# Patient Record
Sex: Female | Born: 1955 | Race: Black or African American | Hispanic: No | Marital: Single | Smoking: Never smoker
Health system: Southern US, Community
[De-identification: ages and names within clinical notes are randomized; demographics above are authoritative.]

## PROBLEM LIST (undated history)

## (undated) DIAGNOSIS — D8689 Sarcoidosis of other sites: Secondary | ICD-10-CM

## (undated) DIAGNOSIS — H469 Unspecified optic neuritis: Secondary | ICD-10-CM

## (undated) DIAGNOSIS — R269 Unspecified abnormalities of gait and mobility: Secondary | ICD-10-CM

## (undated) DIAGNOSIS — F329 Major depressive disorder, single episode, unspecified: Secondary | ICD-10-CM

## (undated) DIAGNOSIS — F32A Depression, unspecified: Secondary | ICD-10-CM

## (undated) DIAGNOSIS — E079 Disorder of thyroid, unspecified: Secondary | ICD-10-CM

## (undated) DIAGNOSIS — S3210XA Unspecified fracture of sacrum, initial encounter for closed fracture: Secondary | ICD-10-CM

## (undated) DIAGNOSIS — E039 Hypothyroidism, unspecified: Secondary | ICD-10-CM

## (undated) HISTORY — PX: TONSILLECTOMY: SUR1361

## (undated) HISTORY — DX: Depression, unspecified: F32.A

## (undated) HISTORY — DX: Major depressive disorder, single episode, unspecified: F32.9

## (undated) HISTORY — DX: Disorder of thyroid, unspecified: E07.9

---

## 2001-12-17 DIAGNOSIS — H469 Unspecified optic neuritis: Secondary | ICD-10-CM

## 2001-12-17 HISTORY — DX: Unspecified optic neuritis: H46.9

## 2011-11-16 DIAGNOSIS — L308 Other specified dermatitis: Secondary | ICD-10-CM | POA: Insufficient documentation

## 2011-11-16 DIAGNOSIS — L304 Erythema intertrigo: Secondary | ICD-10-CM | POA: Insufficient documentation

## 2013-08-21 DIAGNOSIS — H905 Unspecified sensorineural hearing loss: Secondary | ICD-10-CM | POA: Insufficient documentation

## 2013-08-21 DIAGNOSIS — H9319 Tinnitus, unspecified ear: Secondary | ICD-10-CM | POA: Insufficient documentation

## 2014-02-18 DIAGNOSIS — I959 Hypotension, unspecified: Secondary | ICD-10-CM | POA: Diagnosis not present

## 2014-02-18 DIAGNOSIS — E039 Hypothyroidism, unspecified: Secondary | ICD-10-CM | POA: Diagnosis not present

## 2014-02-18 DIAGNOSIS — R42 Dizziness and giddiness: Secondary | ICD-10-CM | POA: Diagnosis not present

## 2014-02-18 DIAGNOSIS — R609 Edema, unspecified: Secondary | ICD-10-CM | POA: Diagnosis not present

## 2014-03-22 DIAGNOSIS — R609 Edema, unspecified: Secondary | ICD-10-CM | POA: Diagnosis not present

## 2014-03-25 DIAGNOSIS — R609 Edema, unspecified: Secondary | ICD-10-CM | POA: Diagnosis not present

## 2014-03-30 DIAGNOSIS — R609 Edema, unspecified: Secondary | ICD-10-CM | POA: Diagnosis not present

## 2014-04-13 DIAGNOSIS — D869 Sarcoidosis, unspecified: Secondary | ICD-10-CM | POA: Diagnosis not present

## 2014-04-13 DIAGNOSIS — R269 Unspecified abnormalities of gait and mobility: Secondary | ICD-10-CM | POA: Diagnosis not present

## 2014-04-19 DIAGNOSIS — D869 Sarcoidosis, unspecified: Secondary | ICD-10-CM | POA: Diagnosis not present

## 2014-05-19 DIAGNOSIS — E876 Hypokalemia: Secondary | ICD-10-CM | POA: Diagnosis not present

## 2014-05-19 DIAGNOSIS — I959 Hypotension, unspecified: Secondary | ICD-10-CM | POA: Diagnosis not present

## 2014-05-19 DIAGNOSIS — F3289 Other specified depressive episodes: Secondary | ICD-10-CM | POA: Diagnosis not present

## 2014-05-19 DIAGNOSIS — F329 Major depressive disorder, single episode, unspecified: Secondary | ICD-10-CM | POA: Diagnosis not present

## 2014-05-19 DIAGNOSIS — E039 Hypothyroidism, unspecified: Secondary | ICD-10-CM | POA: Diagnosis not present

## 2014-05-19 DIAGNOSIS — R609 Edema, unspecified: Secondary | ICD-10-CM | POA: Diagnosis not present

## 2014-06-03 DIAGNOSIS — M999 Biomechanical lesion, unspecified: Secondary | ICD-10-CM | POA: Diagnosis not present

## 2014-06-03 DIAGNOSIS — M530 Cervicocranial syndrome: Secondary | ICD-10-CM | POA: Diagnosis not present

## 2014-06-03 DIAGNOSIS — M9981 Other biomechanical lesions of cervical region: Secondary | ICD-10-CM | POA: Diagnosis not present

## 2014-06-03 DIAGNOSIS — M5430 Sciatica, unspecified side: Secondary | ICD-10-CM | POA: Diagnosis not present

## 2014-06-10 DIAGNOSIS — M530 Cervicocranial syndrome: Secondary | ICD-10-CM | POA: Diagnosis not present

## 2014-06-10 DIAGNOSIS — M999 Biomechanical lesion, unspecified: Secondary | ICD-10-CM | POA: Diagnosis not present

## 2014-06-10 DIAGNOSIS — M9981 Other biomechanical lesions of cervical region: Secondary | ICD-10-CM | POA: Diagnosis not present

## 2014-06-10 DIAGNOSIS — M5430 Sciatica, unspecified side: Secondary | ICD-10-CM | POA: Diagnosis not present

## 2014-06-17 DIAGNOSIS — M530 Cervicocranial syndrome: Secondary | ICD-10-CM | POA: Diagnosis not present

## 2014-06-17 DIAGNOSIS — M9981 Other biomechanical lesions of cervical region: Secondary | ICD-10-CM | POA: Diagnosis not present

## 2014-06-17 DIAGNOSIS — M5430 Sciatica, unspecified side: Secondary | ICD-10-CM | POA: Diagnosis not present

## 2014-06-17 DIAGNOSIS — M999 Biomechanical lesion, unspecified: Secondary | ICD-10-CM | POA: Diagnosis not present

## 2014-06-24 DIAGNOSIS — M5430 Sciatica, unspecified side: Secondary | ICD-10-CM | POA: Diagnosis not present

## 2014-06-24 DIAGNOSIS — M530 Cervicocranial syndrome: Secondary | ICD-10-CM | POA: Diagnosis not present

## 2014-06-24 DIAGNOSIS — M999 Biomechanical lesion, unspecified: Secondary | ICD-10-CM | POA: Diagnosis not present

## 2014-06-24 DIAGNOSIS — M9981 Other biomechanical lesions of cervical region: Secondary | ICD-10-CM | POA: Diagnosis not present

## 2014-07-01 DIAGNOSIS — M999 Biomechanical lesion, unspecified: Secondary | ICD-10-CM | POA: Diagnosis not present

## 2014-07-01 DIAGNOSIS — M9981 Other biomechanical lesions of cervical region: Secondary | ICD-10-CM | POA: Diagnosis not present

## 2014-07-01 DIAGNOSIS — M5430 Sciatica, unspecified side: Secondary | ICD-10-CM | POA: Diagnosis not present

## 2014-07-01 DIAGNOSIS — M530 Cervicocranial syndrome: Secondary | ICD-10-CM | POA: Diagnosis not present

## 2014-07-08 DIAGNOSIS — M999 Biomechanical lesion, unspecified: Secondary | ICD-10-CM | POA: Diagnosis not present

## 2014-07-08 DIAGNOSIS — M9981 Other biomechanical lesions of cervical region: Secondary | ICD-10-CM | POA: Diagnosis not present

## 2014-07-08 DIAGNOSIS — M5430 Sciatica, unspecified side: Secondary | ICD-10-CM | POA: Diagnosis not present

## 2014-07-08 DIAGNOSIS — M530 Cervicocranial syndrome: Secondary | ICD-10-CM | POA: Diagnosis not present

## 2014-07-15 DIAGNOSIS — M999 Biomechanical lesion, unspecified: Secondary | ICD-10-CM | POA: Diagnosis not present

## 2014-07-15 DIAGNOSIS — M530 Cervicocranial syndrome: Secondary | ICD-10-CM | POA: Diagnosis not present

## 2014-07-15 DIAGNOSIS — M9981 Other biomechanical lesions of cervical region: Secondary | ICD-10-CM | POA: Diagnosis not present

## 2014-07-15 DIAGNOSIS — M5430 Sciatica, unspecified side: Secondary | ICD-10-CM | POA: Diagnosis not present

## 2014-07-27 DIAGNOSIS — F329 Major depressive disorder, single episode, unspecified: Secondary | ICD-10-CM | POA: Diagnosis not present

## 2014-07-27 DIAGNOSIS — R609 Edema, unspecified: Secondary | ICD-10-CM | POA: Diagnosis not present

## 2014-07-27 DIAGNOSIS — I959 Hypotension, unspecified: Secondary | ICD-10-CM | POA: Diagnosis not present

## 2014-07-27 DIAGNOSIS — E039 Hypothyroidism, unspecified: Secondary | ICD-10-CM | POA: Diagnosis not present

## 2014-07-27 DIAGNOSIS — F3289 Other specified depressive episodes: Secondary | ICD-10-CM | POA: Diagnosis not present

## 2014-08-02 DIAGNOSIS — H47299 Other optic atrophy, unspecified eye: Secondary | ICD-10-CM | POA: Diagnosis not present

## 2014-08-05 DIAGNOSIS — M5430 Sciatica, unspecified side: Secondary | ICD-10-CM | POA: Diagnosis not present

## 2014-08-05 DIAGNOSIS — M999 Biomechanical lesion, unspecified: Secondary | ICD-10-CM | POA: Diagnosis not present

## 2014-08-05 DIAGNOSIS — M530 Cervicocranial syndrome: Secondary | ICD-10-CM | POA: Diagnosis not present

## 2014-08-05 DIAGNOSIS — M9981 Other biomechanical lesions of cervical region: Secondary | ICD-10-CM | POA: Diagnosis not present

## 2014-08-19 DIAGNOSIS — M999 Biomechanical lesion, unspecified: Secondary | ICD-10-CM | POA: Diagnosis not present

## 2014-08-19 DIAGNOSIS — M9981 Other biomechanical lesions of cervical region: Secondary | ICD-10-CM | POA: Diagnosis not present

## 2014-08-19 DIAGNOSIS — M530 Cervicocranial syndrome: Secondary | ICD-10-CM | POA: Diagnosis not present

## 2014-08-19 DIAGNOSIS — M5430 Sciatica, unspecified side: Secondary | ICD-10-CM | POA: Diagnosis not present

## 2014-08-24 DIAGNOSIS — D869 Sarcoidosis, unspecified: Secondary | ICD-10-CM | POA: Diagnosis not present

## 2014-10-06 DIAGNOSIS — Z23 Encounter for immunization: Secondary | ICD-10-CM | POA: Diagnosis not present

## 2014-12-07 DIAGNOSIS — R51 Headache: Secondary | ICD-10-CM | POA: Diagnosis not present

## 2014-12-07 DIAGNOSIS — E876 Hypokalemia: Secondary | ICD-10-CM | POA: Diagnosis not present

## 2014-12-07 DIAGNOSIS — Z1231 Encounter for screening mammogram for malignant neoplasm of breast: Secondary | ICD-10-CM | POA: Diagnosis not present

## 2014-12-07 DIAGNOSIS — F39 Unspecified mood [affective] disorder: Secondary | ICD-10-CM | POA: Diagnosis not present

## 2014-12-07 DIAGNOSIS — E039 Hypothyroidism, unspecified: Secondary | ICD-10-CM | POA: Diagnosis not present

## 2014-12-07 DIAGNOSIS — R600 Localized edema: Secondary | ICD-10-CM | POA: Diagnosis not present

## 2014-12-24 DIAGNOSIS — Z1231 Encounter for screening mammogram for malignant neoplasm of breast: Secondary | ICD-10-CM | POA: Diagnosis not present

## 2015-02-22 DIAGNOSIS — D8689 Sarcoidosis of other sites: Secondary | ICD-10-CM | POA: Diagnosis not present

## 2015-03-17 DIAGNOSIS — E039 Hypothyroidism, unspecified: Secondary | ICD-10-CM | POA: Diagnosis not present

## 2015-03-17 DIAGNOSIS — F39 Unspecified mood [affective] disorder: Secondary | ICD-10-CM | POA: Diagnosis not present

## 2015-03-17 DIAGNOSIS — R609 Edema, unspecified: Secondary | ICD-10-CM | POA: Diagnosis not present

## 2015-03-17 DIAGNOSIS — D8689 Sarcoidosis of other sites: Secondary | ICD-10-CM | POA: Diagnosis not present

## 2015-04-11 DIAGNOSIS — D8689 Sarcoidosis of other sites: Secondary | ICD-10-CM | POA: Diagnosis not present

## 2015-04-11 DIAGNOSIS — R2681 Unsteadiness on feet: Secondary | ICD-10-CM | POA: Diagnosis not present

## 2015-05-09 DIAGNOSIS — E039 Hypothyroidism, unspecified: Secondary | ICD-10-CM | POA: Diagnosis not present

## 2015-05-09 DIAGNOSIS — E860 Dehydration: Secondary | ICD-10-CM | POA: Diagnosis not present

## 2015-05-09 DIAGNOSIS — L304 Erythema intertrigo: Secondary | ICD-10-CM | POA: Diagnosis not present

## 2015-05-09 DIAGNOSIS — R358 Other polyuria: Secondary | ICD-10-CM | POA: Diagnosis not present

## 2015-05-09 DIAGNOSIS — F321 Major depressive disorder, single episode, moderate: Secondary | ICD-10-CM | POA: Diagnosis not present

## 2015-05-09 DIAGNOSIS — D8689 Sarcoidosis of other sites: Secondary | ICD-10-CM | POA: Diagnosis not present

## 2015-05-09 DIAGNOSIS — E876 Hypokalemia: Secondary | ICD-10-CM | POA: Diagnosis not present

## 2015-05-09 DIAGNOSIS — R5383 Other fatigue: Secondary | ICD-10-CM | POA: Diagnosis not present

## 2015-05-11 DIAGNOSIS — L304 Erythema intertrigo: Secondary | ICD-10-CM | POA: Diagnosis not present

## 2015-05-11 DIAGNOSIS — D8689 Sarcoidosis of other sites: Secondary | ICD-10-CM | POA: Diagnosis not present

## 2015-05-11 DIAGNOSIS — F321 Major depressive disorder, single episode, moderate: Secondary | ICD-10-CM | POA: Diagnosis not present

## 2015-05-11 DIAGNOSIS — R5383 Other fatigue: Secondary | ICD-10-CM | POA: Diagnosis not present

## 2015-05-11 DIAGNOSIS — R358 Other polyuria: Secondary | ICD-10-CM | POA: Diagnosis not present

## 2015-05-11 DIAGNOSIS — E039 Hypothyroidism, unspecified: Secondary | ICD-10-CM | POA: Diagnosis not present

## 2015-05-11 DIAGNOSIS — E876 Hypokalemia: Secondary | ICD-10-CM | POA: Diagnosis not present

## 2015-05-11 DIAGNOSIS — E860 Dehydration: Secondary | ICD-10-CM | POA: Diagnosis not present

## 2015-05-12 ENCOUNTER — Emergency Department (HOSPITAL_COMMUNITY)
Admission: EM | Admit: 2015-05-12 | Discharge: 2015-05-12 | Disposition: A | Discharge status: Home / Self Care | Payer: Medicare Other | Attending: Emergency Medicine | Admitting: Emergency Medicine

## 2015-05-12 ENCOUNTER — Emergency Department (HOSPITAL_COMMUNITY): Payer: Medicare Other

## 2015-05-12 ENCOUNTER — Encounter (HOSPITAL_COMMUNITY): Payer: Self-pay | Admitting: Emergency Medicine

## 2015-05-12 DIAGNOSIS — H540 Blindness, both eyes: Secondary | ICD-10-CM | POA: Diagnosis not present

## 2015-05-12 DIAGNOSIS — W01198A Fall on same level from slipping, tripping and stumbling with subsequent striking against other object, initial encounter: Secondary | ICD-10-CM | POA: Diagnosis not present

## 2015-05-12 DIAGNOSIS — S0990XA Unspecified injury of head, initial encounter: Secondary | ICD-10-CM | POA: Diagnosis not present

## 2015-05-12 DIAGNOSIS — Y9289 Other specified places as the place of occurrence of the external cause: Secondary | ICD-10-CM | POA: Diagnosis not present

## 2015-05-12 DIAGNOSIS — Y9389 Activity, other specified: Secondary | ICD-10-CM | POA: Insufficient documentation

## 2015-05-12 DIAGNOSIS — M545 Low back pain: Secondary | ICD-10-CM | POA: Diagnosis not present

## 2015-05-12 DIAGNOSIS — Z7952 Long term (current) use of systemic steroids: Secondary | ICD-10-CM | POA: Diagnosis not present

## 2015-05-12 DIAGNOSIS — Z79899 Other long term (current) drug therapy: Secondary | ICD-10-CM | POA: Insufficient documentation

## 2015-05-12 DIAGNOSIS — R102 Pelvic and perineal pain: Secondary | ICD-10-CM | POA: Diagnosis not present

## 2015-05-12 DIAGNOSIS — D8689 Sarcoidosis of other sites: Secondary | ICD-10-CM | POA: Diagnosis not present

## 2015-05-12 DIAGNOSIS — Z880 Allergy status to penicillin: Secondary | ICD-10-CM | POA: Insufficient documentation

## 2015-05-12 DIAGNOSIS — Z9181 History of falling: Secondary | ICD-10-CM | POA: Insufficient documentation

## 2015-05-12 DIAGNOSIS — R531 Weakness: Secondary | ICD-10-CM

## 2015-05-12 DIAGNOSIS — S3992XA Unspecified injury of lower back, initial encounter: Secondary | ICD-10-CM | POA: Diagnosis not present

## 2015-05-12 DIAGNOSIS — Z862 Personal history of diseases of the blood and blood-forming organs and certain disorders involving the immune mechanism: Secondary | ICD-10-CM | POA: Diagnosis not present

## 2015-05-12 DIAGNOSIS — R52 Pain, unspecified: Secondary | ICD-10-CM

## 2015-05-12 DIAGNOSIS — S3993XA Unspecified injury of pelvis, initial encounter: Secondary | ICD-10-CM | POA: Diagnosis not present

## 2015-05-12 DIAGNOSIS — Y998 Other external cause status: Secondary | ICD-10-CM | POA: Insufficient documentation

## 2015-05-12 DIAGNOSIS — Z043 Encounter for examination and observation following other accident: Secondary | ICD-10-CM | POA: Diagnosis present

## 2015-05-12 DIAGNOSIS — K5900 Constipation, unspecified: Secondary | ICD-10-CM

## 2015-05-12 DIAGNOSIS — Z792 Long term (current) use of antibiotics: Secondary | ICD-10-CM | POA: Insufficient documentation

## 2015-05-12 DIAGNOSIS — R339 Retention of urine, unspecified: Secondary | ICD-10-CM

## 2015-05-12 HISTORY — DX: Sarcoidosis of other sites: D86.89

## 2015-05-12 LAB — COMPREHENSIVE METABOLIC PANEL
ALK PHOS: 64 U/L (ref 38–126)
ALT: 24 U/L (ref 14–54)
AST: 32 U/L (ref 15–41)
Albumin: 3.4 g/dL — ABNORMAL LOW (ref 3.5–5.0)
Anion gap: 9 (ref 5–15)
BUN: 15 mg/dL (ref 6–20)
CALCIUM: 9 mg/dL (ref 8.9–10.3)
CO2: 25 mmol/L (ref 22–32)
Chloride: 105 mmol/L (ref 101–111)
Creatinine, Ser: 0.76 mg/dL (ref 0.44–1.00)
GFR calc Af Amer: >60 mL/min (ref >60)
Glucose, Bld: 82 mg/dL (ref 65–99)
POTASSIUM: 3.9 mmol/L (ref 3.5–5.1)
Sodium: 139 mmol/L (ref 135–145)
TOTAL PROTEIN: 6.7 g/dL (ref 6.5–8.1)
Total Bilirubin: 0.4 mg/dL (ref 0.3–1.2)

## 2015-05-12 LAB — CBC WITH DIFFERENTIAL/PLATELET
BASOS ABS: 0 10*3/uL (ref 0.0–0.1)
Basophils Relative: 0 % (ref 0–1)
EOS ABS: 0.4 10*3/uL (ref 0.0–0.7)
Eosinophils Relative: 4 % (ref 0–5)
HCT: 30.1 % — ABNORMAL LOW (ref 36.0–46.0)
Hemoglobin: 10.6 g/dL — ABNORMAL LOW (ref 12.0–15.0)
Lymphocytes Relative: 17 % (ref 12–46)
Lymphs Abs: 1.5 10*3/uL (ref 0.7–4.0)
MCH: 28.5 pg (ref 26.0–34.0)
MCHC: 35.2 g/dL (ref 30.0–36.0)
MCV: 80.9 fL (ref 78.0–100.0)
MONO ABS: 0.5 10*3/uL (ref 0.1–1.0)
Monocytes Relative: 6 % (ref 3–12)
NEUTROS PCT: 73 % (ref 43–77)
Neutro Abs: 6.3 10*3/uL (ref 1.7–7.7)
Platelets: 180 10*3/uL (ref 150–400)
RBC: 3.72 MIL/uL — ABNORMAL LOW (ref 3.87–5.11)
RDW: 14.6 % (ref 11.5–15.5)
WBC: 8.6 10*3/uL (ref 4.0–10.5)

## 2015-05-12 MED ORDER — HYDROCODONE-ACETAMINOPHEN 5-325 MG PO TABS
1.0000 | ORAL_TABLET | Freq: Once | ORAL | Status: AC
  Administered 2015-05-12: 1 via ORAL
  Filled 2015-05-12: qty 1

## 2015-05-12 MED ORDER — KETOROLAC TROMETHAMINE 60 MG/2ML IM SOLN
60.0000 mg | Freq: Once | INTRAMUSCULAR | Status: AC
  Administered 2015-05-12: 60 mg via INTRAMUSCULAR
  Filled 2015-05-12: qty 2

## 2015-05-12 MED ORDER — POLYETHYLENE GLYCOL 3350 17 GM/SCOOP PO POWD: 17.0000 g | Freq: Every day | ORAL | Status: DC

## 2015-05-12 NOTE — ED Notes (Signed)
Bladder scan results 311 mL's

## 2015-05-12 NOTE — ED Notes (Signed)
Patient transported to X-ray will do lABS WHEN PT RETURNS

## 2015-05-12 NOTE — ED Provider Notes (Signed)
CSN: [id number]     Arrival date & time 05/12/15  7341 History   First MD Initiated Contact with Patient 05/12/15 0830     Chief Complaint  Patient presents with  . Fall      HPI Patient has long-standing history of recurrent falls.  This seems to be worsening over the past several weeks per family.  The patient resides in [place] and is being transitioned to a home here in [place] where she will be closer to her family which is in [place].  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  She has a history of peripheral neuropathy.  She does have some instability and walks with a cane.  No recent fever or chills.  Denies any changes in her recurrent low back pain.  Some urinary hesitancy without dysuria.  She has had some constipation but denies bowel or bladder incontinence.  She did have a fall recently that resulted in abrasion of her right temporal region.  No recent change in medications.  She is not on anticoagulants.   Past Medical History  Diagnosis Date  . 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 Sarcoidosis    No past surgical history on file. No family history on file. History  Substance Use Topics  . Smoking status: Never Smoker   . Smokeless tobacco: Not on file  . Alcohol Use: No   OB History    No data available     Review of Systems  All other systems reviewed and are negative.     Allergies  Penicillins  Home Medications   Prior to Admission medications   Medication Sig Start Date End Date Taking? Authorizing Provider  FLUoxetine (PROZAC) 40 MG capsule Take 40 mg by mouth daily. 03/17/15  Yes Historical Provider, MD  furosemide (LASIX) 40 MG tablet Take 40 mg by mouth daily as needed for fluid or edema.  07/27/14  Yes Historical Provider, MD  levothyroxine (SYNTHROID, LEVOTHROID) 100 MCG tablet Take 100 mcg by mouth daily. 03/15/15  Yes Historical Provider, MD  polyethylene glycol  powder (MIRALAX) powder Take 17 g by mouth daily. 05/12/15   Jola Schmidt, MD  potassium chloride (K-DUR) 10 MEQ tablet Take 10 mEq by mouth daily.   Yes Historical Provider, MD  predniSONE (DELTASONE) 10 MG tablet Take 10 mg by mouth daily with breakfast.   Yes Historical Provider, MD  sulfamethoxazole-trimethoprim (BACTRIM DS,SEPTRA DS) 800-160 MG per tablet Take 1 tablet by mouth 2 (two) times daily.   Yes Historical Provider, MD   BP 113/56 mmHg  Pulse 105  Temp(Src) 98.2 F (36.8 C) (Oral)  Resp 15  SpO2 100% Physical Exam  Constitutional: She is oriented to person, place, and time. She appears well-developed and well-nourished. No distress.  HENT:  Head: Normocephalic and atraumatic.  Eyes: EOM are normal. Pupils are equal, round, and reactive to light.  Neck: Normal range of motion.  Cardiovascular: Normal rate, regular rhythm and normal heart sounds.   Pulmonary/Chest: Effort normal and breath sounds normal.  Abdominal: Soft. She exhibits no distension. There is no tenderness.  Musculoskeletal: Normal range of motion.  Mild lumbar and paralumbar tenderness without lumbar step-off.  Full range of motion bilateral hips, knees, ankles.  Neurological: She is alert and oriented to person, place, and time.  5/5 strength in major muscle groups of  bilateral upper and lower extremities. Speech normal. No facial asymetry.   Skin: Skin is  warm and dry.  Psychiatric: She has a normal mood and affect. Judgment normal.  Nursing note and vitals reviewed.   ED Course  Procedures (including critical care time) Labs Review Labs Reviewed  CBC WITH DIFFERENTIAL/PLATELET - Abnormal; Notable for the following:    RBC 3.72 (*)    Hemoglobin 10.6 (*)    HCT 30.1 (*)    All other components within normal limits  COMPREHENSIVE METABOLIC PANEL - Abnormal; Notable for the following:    Albumin 3.4 (*)    All other components within normal limits    Imaging Review Dg Lumbar Spine  Complete  05/12/2015   CLINICAL DATA:  Low back pain following recent falls, initial encounter  EXAM: LUMBAR SPINE - COMPLETE 4+ VIEW  COMPARISON:  None.  FINDINGS: There is no evidence of lumbar spine fracture. Alignment is normal. Intervertebral disc spaces are maintained.  IMPRESSION: No acute abnormality noted.   Electronically Signed   By: Inez Catalina M.D.   On: 05/12/2015 09:43   Dg Pelvis 1-2 Views  05/12/2015   CLINICAL DATA:  Pain following fall  EXAM: PELVIS - 1-2 VIEW  COMPARISON:  None.  FINDINGS: There is no evidence of fracture or dislocation. Joint spaces appear intact. No erosive change.  IMPRESSION: No fracture or dislocation.  No appreciable arthropathy.   Electronically Signed   By: Lowella Grip III M.D.   On: 05/12/2015 09:43   Ct Head Wo Contrast  05/12/2015   CLINICAL DATA:  Multiple falls  EXAM: CT HEAD WITHOUT CONTRAST  TECHNIQUE: Contiguous axial images were obtained from the base of the skull through the vertex without intravenous contrast.  COMPARISON:  None.  FINDINGS: The bony calvarium is intact. No gross soft tissue abnormality is noted. Lateral ventricles are prominent without significant cortical atrophy. Few scattered areas of decreased attenuation consistent chronic white matter ischemic change are noted. Prominence of the fourth ventricle is noted as well. No space-occupying mass lesion or acute hemorrhage is noted.  IMPRESSION: Chronic ischemic changes.  Prominence of the lateral ventricles and fourth ventricle. This may be due to a component of normal pressure hydrocephalus.   Electronically Signed   By: Inez Catalina M.D.   On: 05/12/2015 10:05     EKG Interpretation None      MDM   Final diagnoses:  Pain  Weakness  Urinary retention  Constipation, unspecified constipation type    Patient is able to walk some with me.  She does not have focal weakness.  She states that she feels like it may be worsening of her neuropathy.  This is definitely  reasonable.  I do not think that she is MRI imaging of her back.  Head CT and labs without significant abnormality.  Lumbar spine was completed initiated has some tenderness around her lumbar spine in a recent fall but no obvious compression fractures are noted.  Case management and social work was involved to provide additional assistance.  The patient will need a primary care physician in the region.  In the meantime a vas that she follow back up with her primary care physician in [place].  His may not be an optimal time to be transitioning the patient from her prior home situation which included home health services to a new location.    Jola Schmidt, MD 05/17/15 [phone]

## 2015-05-12 NOTE — ED Notes (Signed)
CM at bedside

## 2015-05-12 NOTE — ED Notes (Signed)
Pt c/o frequent falls, last fall yesterday, abrasion present to right temple. Pt denies dizziness, denies fainting. Pt states she falls after "bumping into something or turning the wrong way." Pt also c/o anorexia, oliguria, and constipation.

## 2015-05-12 NOTE — Care Management Note (Addendum)
Case Management Note  Patient Details  Name: Jamie [name] MRN: [id number] Date of Birth: [birth year]  Subjective/Objective:    59 yr old medicare pt who lived in [place] and is "in transition" with moving to [place] to an apartment. C/o keeps falling   Pt has support of her parents (primary care givers other than PDN) who live in [place].  Pt reports she has a pcp in [place] and has been seen at [facility] in [place].  Reports having PDN (Private duty nursing) staff in [place] she paid out of pocket to care for her and does not mind paying for PDN service in [place]. Wt 118-120 lbs Ht 5'4" Has only cane and borrowed walker but reports walker to big, task to take it around. Has not had DME via Medicare in last 2-3 years.    Action/Plan: CM assessed pt for needs with ED SW present.  CM discussed details medicare guidelines, home health [facility]) (length of stay in home, types of [facility] staff available, coverage, primary caregiver, up to 24 hrs before services may be started) and Private duty nursing (PDN-coverage, length of stay in the home types of staff available).  CM provided family with a list of [place],  PDN and a 12 page list of medicare internal medicine providers within 7 miles of zip code [phone] (pt apt) to help with finding a local medicare pcp  if Dr Cathlean Cower is unable to accept new medicare pts.     Expected Discharge Date:   05/12/15                Expected Discharge Plan:    home with parents, resources, DME  and home services [facility] & PDN)   In-House Referral:   ED SW  Discharge planning Services   home health with DME   Post Acute Care Choice:   PDN, home health, DME Choice offered to:   pt and parents  DME Arranged:   rolling walker DME Agency:   [facility]  HH Arranged:   [place], PT, aide  [place] Agency:   [facility]   Status of Service:   completed   Additional Comments: 1228 updated EDP  about home health, DME and pt and parents concerns about pt bladder and bowel elimination (none today)   1234 Cm completed referral to [facility] of [facility] for [place] and aide plus walker to be delivered to [facility] ED prior to d/c. Discuss pcp changes and need to call parents prior to home services starting  [place] Father [phone]  .  Attempts were made to get pt an appt at Cathlean Cower office but office closed for lunch until 1300.  Reviewed PDN list and parents to f/u on PDN for [place] and appt with Cathlean Cower but Dr Bobette Mo is still present pcp  1332 Cm attempt to get pt an appt with Dr Jenny Reichmann but Rachel Bo at [phone] confirmed he is not accepting new pts but Dr Doug Sou is.  Spoke with parents who had also called and did make an appt with [facility] for June 23 [phone]    Robbie Lis, RN, CCM 05/12/2015, 11:28 AM

## 2015-05-12 NOTE — Progress Notes (Signed)
CM ED spoke with Jamie [name] after consulted by ED [place] about home health needs for patient  EPIC reviewed first [facility] ED visit for patient who recently moved to [facility] no PCP at this time

## 2015-05-12 NOTE — ED Notes (Signed)
I was unsuccessful with getting blood, she is a hard stick... Will call lab to come get blood

## 2015-05-13 NOTE — Progress Notes (Addendum)
[place] Father [phone] Pt father contacted at above number to f/u on pt  Reports pending [place] agency call Cm to call [facility] to provide father number again CM requested a return call from pt/father on Monday if needed  Pt doing well  no complaints  1225 message sent to [facility]

## 2015-05-16 DIAGNOSIS — D8689 Sarcoidosis of other sites: Secondary | ICD-10-CM | POA: Diagnosis not present

## 2015-05-16 DIAGNOSIS — Z9181 History of falling: Secondary | ICD-10-CM | POA: Diagnosis not present

## 2015-05-16 DIAGNOSIS — R32 Unspecified urinary incontinence: Secondary | ICD-10-CM | POA: Diagnosis not present

## 2015-05-16 DIAGNOSIS — H905 Unspecified sensorineural hearing loss: Secondary | ICD-10-CM | POA: Diagnosis not present

## 2015-05-16 DIAGNOSIS — H548 Legal blindness, as defined in USA: Secondary | ICD-10-CM | POA: Diagnosis not present

## 2015-05-16 DIAGNOSIS — R159 Full incontinence of feces: Secondary | ICD-10-CM | POA: Diagnosis not present

## 2015-05-16 DIAGNOSIS — M810 Age-related osteoporosis without current pathological fracture: Secondary | ICD-10-CM | POA: Diagnosis not present

## 2015-05-17 DIAGNOSIS — M810 Age-related osteoporosis without current pathological fracture: Secondary | ICD-10-CM | POA: Diagnosis not present

## 2015-05-17 DIAGNOSIS — R159 Full incontinence of feces: Secondary | ICD-10-CM | POA: Diagnosis not present

## 2015-05-17 DIAGNOSIS — H548 Legal blindness, as defined in USA: Secondary | ICD-10-CM | POA: Diagnosis not present

## 2015-05-17 DIAGNOSIS — H905 Unspecified sensorineural hearing loss: Secondary | ICD-10-CM | POA: Diagnosis not present

## 2015-05-17 DIAGNOSIS — D8689 Sarcoidosis of other sites: Secondary | ICD-10-CM | POA: Diagnosis not present

## 2015-05-17 DIAGNOSIS — R32 Unspecified urinary incontinence: Secondary | ICD-10-CM | POA: Diagnosis not present

## 2015-05-18 DIAGNOSIS — M810 Age-related osteoporosis without current pathological fracture: Secondary | ICD-10-CM | POA: Diagnosis not present

## 2015-05-18 DIAGNOSIS — R32 Unspecified urinary incontinence: Secondary | ICD-10-CM | POA: Diagnosis not present

## 2015-05-18 DIAGNOSIS — D8689 Sarcoidosis of other sites: Secondary | ICD-10-CM | POA: Diagnosis not present

## 2015-05-18 DIAGNOSIS — H548 Legal blindness, as defined in USA: Secondary | ICD-10-CM | POA: Diagnosis not present

## 2015-05-18 DIAGNOSIS — R159 Full incontinence of feces: Secondary | ICD-10-CM | POA: Diagnosis not present

## 2015-05-18 DIAGNOSIS — H905 Unspecified sensorineural hearing loss: Secondary | ICD-10-CM | POA: Diagnosis not present

## 2015-05-19 DIAGNOSIS — D8689 Sarcoidosis of other sites: Secondary | ICD-10-CM | POA: Diagnosis not present

## 2015-05-19 DIAGNOSIS — H905 Unspecified sensorineural hearing loss: Secondary | ICD-10-CM | POA: Diagnosis not present

## 2015-05-19 DIAGNOSIS — M810 Age-related osteoporosis without current pathological fracture: Secondary | ICD-10-CM | POA: Diagnosis not present

## 2015-05-19 DIAGNOSIS — H548 Legal blindness, as defined in USA: Secondary | ICD-10-CM | POA: Diagnosis not present

## 2015-05-19 DIAGNOSIS — R32 Unspecified urinary incontinence: Secondary | ICD-10-CM | POA: Diagnosis not present

## 2015-05-19 DIAGNOSIS — R159 Full incontinence of feces: Secondary | ICD-10-CM | POA: Diagnosis not present

## 2015-05-20 ENCOUNTER — Inpatient Hospital Stay (HOSPITAL_COMMUNITY)
Admission: EM | Admit: 2015-05-20 | Discharge: 2015-05-24 | DRG: 197 | Disposition: A | Discharge status: Rehabilitation Facility | Payer: Medicare Other | Attending: Internal Medicine | Admitting: Internal Medicine

## 2015-05-20 ENCOUNTER — Emergency Department (HOSPITAL_COMMUNITY): Payer: Medicare Other

## 2015-05-20 ENCOUNTER — Ambulatory Visit: Payer: Medicare Other | Admitting: Family

## 2015-05-20 DIAGNOSIS — D8689 Sarcoidosis of other sites: Secondary | ICD-10-CM | POA: Diagnosis present

## 2015-05-20 DIAGNOSIS — N319 Neuromuscular dysfunction of bladder, unspecified: Secondary | ICD-10-CM | POA: Diagnosis present

## 2015-05-20 DIAGNOSIS — M4802 Spinal stenosis, cervical region: Secondary | ICD-10-CM | POA: Diagnosis present

## 2015-05-20 DIAGNOSIS — R32 Unspecified urinary incontinence: Secondary | ICD-10-CM | POA: Diagnosis present

## 2015-05-20 DIAGNOSIS — S3210XA Unspecified fracture of sacrum, initial encounter for closed fracture: Secondary | ICD-10-CM | POA: Diagnosis not present

## 2015-05-20 DIAGNOSIS — H548 Legal blindness, as defined in USA: Secondary | ICD-10-CM | POA: Diagnosis present

## 2015-05-20 DIAGNOSIS — Z9181 History of falling: Secondary | ICD-10-CM

## 2015-05-20 DIAGNOSIS — D869 Sarcoidosis, unspecified: Secondary | ICD-10-CM | POA: Diagnosis not present

## 2015-05-20 DIAGNOSIS — G629 Polyneuropathy, unspecified: Secondary | ICD-10-CM | POA: Diagnosis present

## 2015-05-20 DIAGNOSIS — R159 Full incontinence of feces: Secondary | ICD-10-CM | POA: Diagnosis present

## 2015-05-20 DIAGNOSIS — S0990XA Unspecified injury of head, initial encounter: Secondary | ICD-10-CM | POA: Diagnosis not present

## 2015-05-20 DIAGNOSIS — K592 Neurogenic bowel, not elsewhere classified: Secondary | ICD-10-CM | POA: Diagnosis present

## 2015-05-20 DIAGNOSIS — E039 Hypothyroidism, unspecified: Secondary | ICD-10-CM | POA: Diagnosis present

## 2015-05-20 DIAGNOSIS — Z880 Allergy status to penicillin: Secondary | ICD-10-CM

## 2015-05-20 DIAGNOSIS — N390 Urinary tract infection, site not specified: Secondary | ICD-10-CM | POA: Diagnosis present

## 2015-05-20 DIAGNOSIS — Z79899 Other long term (current) drug therapy: Secondary | ICD-10-CM

## 2015-05-20 DIAGNOSIS — R2681 Unsteadiness on feet: Secondary | ICD-10-CM | POA: Insufficient documentation

## 2015-05-20 DIAGNOSIS — R339 Retention of urine, unspecified: Secondary | ICD-10-CM | POA: Diagnosis not present

## 2015-05-20 DIAGNOSIS — R531 Weakness: Secondary | ICD-10-CM | POA: Diagnosis not present

## 2015-05-20 DIAGNOSIS — M810 Age-related osteoporosis without current pathological fracture: Secondary | ICD-10-CM | POA: Diagnosis not present

## 2015-05-20 DIAGNOSIS — R29898 Other symptoms and signs involving the musculoskeletal system: Secondary | ICD-10-CM

## 2015-05-20 DIAGNOSIS — H905 Unspecified sensorineural hearing loss: Secondary | ICD-10-CM | POA: Diagnosis not present

## 2015-05-20 DIAGNOSIS — F329 Major depressive disorder, single episode, unspecified: Secondary | ICD-10-CM | POA: Diagnosis present

## 2015-05-20 DIAGNOSIS — I4510 Unspecified right bundle-branch block: Secondary | ICD-10-CM | POA: Diagnosis present

## 2015-05-20 DIAGNOSIS — S32111A Minimally displaced Zone I fracture of sacrum, initial encounter for closed fracture: Secondary | ICD-10-CM | POA: Diagnosis not present

## 2015-05-20 DIAGNOSIS — M6281 Muscle weakness (generalized): Secondary | ICD-10-CM | POA: Diagnosis not present

## 2015-05-20 DIAGNOSIS — W19XXXA Unspecified fall, initial encounter: Secondary | ICD-10-CM | POA: Diagnosis present

## 2015-05-20 DIAGNOSIS — R200 Anesthesia of skin: Secondary | ICD-10-CM | POA: Diagnosis present

## 2015-05-20 DIAGNOSIS — Z8249 Family history of ischemic heart disease and other diseases of the circulatory system: Secondary | ICD-10-CM

## 2015-05-20 HISTORY — DX: Unspecified abnormalities of gait and mobility: R26.9

## 2015-05-20 HISTORY — DX: Unspecified optic neuritis: H46.9

## 2015-05-20 HISTORY — DX: Sarcoidosis of other sites: D86.89

## 2015-05-20 LAB — URINALYSIS, ROUTINE W REFLEX MICROSCOPIC
BILIRUBIN URINE: NEGATIVE
Glucose, UA: NEGATIVE mg/dL
Hgb urine dipstick: NEGATIVE
Ketones, ur: NEGATIVE mg/dL
NITRITE: POSITIVE — AB
PH: 6 (ref 5.0–8.0)
Protein, ur: NEGATIVE mg/dL
Specific Gravity, Urine: 1.021 (ref 1.005–1.030)
Urobilinogen, UA: 0.2 mg/dL (ref 0.0–1.0)

## 2015-05-20 LAB — COMPREHENSIVE METABOLIC PANEL
ALT: 28 U/L (ref 14–54)
AST: 25 U/L (ref 15–41)
Albumin: 3.4 g/dL — ABNORMAL LOW (ref 3.5–5.0)
Alkaline Phosphatase: 144 U/L — ABNORMAL HIGH (ref 38–126)
Anion gap: 8 (ref 5–15)
BILIRUBIN TOTAL: 0.3 mg/dL (ref 0.3–1.2)
BUN: 20 mg/dL (ref 6–20)
CHLORIDE: 105 mmol/L (ref 101–111)
CO2: 28 mmol/L (ref 22–32)
CREATININE: 0.55 mg/dL (ref 0.44–1.00)
Calcium: 9.2 mg/dL (ref 8.9–10.3)
GFR calc Af Amer: >60 mL/min (ref >60)
GFR calc non Af Amer: >60 mL/min (ref >60)
GLUCOSE: 87 mg/dL (ref 65–99)
Potassium: 3.8 mmol/L (ref 3.5–5.1)
Sodium: 141 mmol/L (ref 135–145)
TOTAL PROTEIN: 6.8 g/dL (ref 6.5–8.1)

## 2015-05-20 LAB — CBC WITH DIFFERENTIAL/PLATELET
Basophils Absolute: 0.1 10*3/uL (ref 0.0–0.1)
Basophils Relative: 1 % (ref 0–1)
EOS ABS: 0.5 10*3/uL (ref 0.0–0.7)
Eosinophils Relative: 7 % — ABNORMAL HIGH (ref 0–5)
HCT: 31.4 % — ABNORMAL LOW (ref 36.0–46.0)
Hemoglobin: 10.8 g/dL — ABNORMAL LOW (ref 12.0–15.0)
LYMPHS ABS: 2 10*3/uL (ref 0.7–4.0)
Lymphocytes Relative: 27 % (ref 12–46)
MCH: 28.6 pg (ref 26.0–34.0)
MCHC: 34.4 g/dL (ref 30.0–36.0)
MCV: 83.1 fL (ref 78.0–100.0)
Monocytes Absolute: 0.5 10*3/uL (ref 0.1–1.0)
Monocytes Relative: 6 % (ref 3–12)
NEUTROS PCT: 59 % (ref 43–77)
Neutro Abs: 4.3 10*3/uL (ref 1.7–7.7)
PLATELETS: 360 10*3/uL (ref 150–400)
RBC: 3.78 MIL/uL — AB (ref 3.87–5.11)
RDW: 14.6 % (ref 11.5–15.5)
WBC: 7.4 10*3/uL (ref 4.0–10.5)

## 2015-05-20 LAB — URINE MICROSCOPIC-ADD ON

## 2015-05-20 NOTE — ED Provider Notes (Signed)
CSN: [id number]     Arrival date & time 05/20/15  1302 History   First MD Initiated Contact with Patient 05/20/15 1543     Chief Complaint  Patient presents with  . Fall  . Weakness     (Consider location/radiation/quality/duration/timing/severity/associated sxs/prior Treatment) HPI Comments: Patient with a history of neurosarcoidosis presents with falls and increased back pain. She was living on her own in [place]. She recently moved appear to be closer to her parents. She is currently living independently but has home health and her parents are helping to take care of her. She states that she has a long-standing history of falls. However she had a recent fall on May 26 when she was going out to get a trash can. She fell backward and landed on her bottom. She's had increased pain to her lower back with pain radiating down her legs. She has pain in her feet and numbness in her feet. She states the symptoms started after this most recent fall. Since that time she's had increased difficulty ambulating and at this point is not able to walk at all without significant assistance. She also has been having some increased pain to her neck. She has fallen 2 more times since this original fall. She did hit her head recently and has had recurrent headaches. She also has had some loss of bladder control. She states when she stands up her urine runs out.  She did not have any of these problems prior to this most recent fall. She denies any other recent illnesses. She denies any fevers cough congestion.  Patient is a 59 y.o. female presenting with fall and weakness.  Fall Associated symptoms include headaches. Pertinent negatives include no chest pain, no abdominal pain and no shortness of breath.  Weakness Associated symptoms include headaches. Pertinent negatives include no chest pain, no abdominal pain and no shortness of breath.    Past Medical History  Diagnosis Date  . Peripheral neuropathy in  sarcoidosis   . Sarcoidosis    No past surgical history on file. No family history on file. History  Substance Use Topics  . Smoking status: Never Smoker   . Smokeless tobacco: Not on file  . Alcohol Use: No   OB History    No data available     Review of Systems  Constitutional: Negative for fever, chills, diaphoresis and fatigue.  HENT: Negative for congestion, rhinorrhea and sneezing.   Eyes: Negative.   Respiratory: Negative for cough, chest tightness and shortness of breath.   Cardiovascular: Negative for chest pain and leg swelling.  Gastrointestinal: Negative for nausea, vomiting, abdominal pain, diarrhea and blood in stool.  Genitourinary: Negative for frequency, hematuria, flank pain and difficulty urinating.       Urinary incontinence  Musculoskeletal: Positive for back pain and neck pain. Negative for arthralgias.  Skin: Negative for rash.  Neurological: Positive for weakness, numbness and headaches. Negative for dizziness and speech difficulty.      Allergies  Penicillins  Home Medications   Prior to Admission medications   Medication Sig Start Date End Date Taking? Authorizing Provider  FLUoxetine (PROZAC) 40 MG capsule Take 40 mg by mouth daily. 03/17/15  Yes Historical Provider, MD  furosemide (LASIX) 40 MG tablet Take 40 mg by mouth daily as needed for fluid or edema.  07/27/14  Yes Historical Provider, MD  levothyroxine (SYNTHROID, LEVOTHROID) 100 MCG tablet Take 100 mcg by mouth daily. 03/15/15  Yes Historical Provider, MD  polyethylene glycol powder (  MIRALAX) powder Take 17 g by mouth daily. 05/12/15  Yes Jola Schmidt, MD  potassium chloride (K-DUR) 10 MEQ tablet Take 10 mEq by mouth daily.   Yes Historical Provider, MD   BP 121/81 mmHg  Pulse 98  Temp(Src) 98.6 F (37 C) (Oral)  Resp 20  SpO2 99% Physical Exam  Constitutional: She is oriented to person, place, and time. She appears well-developed and well-nourished.  HENT:  Head: Normocephalic and  atraumatic.  Eyes: Pupils are equal, round, and reactive to light.  Neck:  Patient has tenderness in her upper cervical spine. There is no step-offs or deformities. There is no significant pain in thoracic spine. There is tenderness to the L5-S1 area and her lumbar spine.  Cardiovascular: Normal rate, regular rhythm and normal heart sounds.   Pulmonary/Chest: Effort normal and breath sounds normal. No respiratory distress. She has no wheezes. She has no rales. She exhibits no tenderness.  Abdominal: Soft. Bowel sounds are normal. There is no tenderness. There is no rebound and no guarding.  Musculoskeletal: Normal range of motion. She exhibits no edema.  Lymphadenopathy:    She has no cervical adenopathy.  Neurological: She is alert and oriented to person, place, and time.  She has generalized weakness in all extremities. She has a slight tremor to her upper extremities. She has no drift. She is able to raise her left leg slightly against gravity but is unable to raise her right leg against gravity. She has sensation to light touch but she states is diminished to her lower extremities bilaterally. She states the numbness is primarily in the soles of her feet.  Skin: Skin is warm and dry. No rash noted.  Psychiatric: She has a normal mood and affect.    ED Course  Procedures (including critical care time) Labs Review Labs Reviewed  COMPREHENSIVE METABOLIC PANEL - Abnormal; Notable for the following:    Albumin 3.4 (*)    Alkaline Phosphatase 144 (*)    All other components within normal limits  CBC WITH DIFFERENTIAL/PLATELET - Abnormal; Notable for the following:    RBC 3.78 (*)    Hemoglobin 10.8 (*)    HCT 31.4 (*)    Eosinophils Relative 7 (*)    All other components within normal limits  URINALYSIS, ROUTINE W REFLEX MICROSCOPIC (NOT AT [facility]) - Abnormal; Notable for the following:    APPearance CLOUDY (*)    Nitrite POSITIVE (*)    Leukocytes, UA SMALL (*)    All other components  within normal limits  URINE MICROSCOPIC-ADD ON - Abnormal; Notable for the following:    Bacteria, UA MANY (*)    All other components within normal limits  URINE CULTURE    Imaging Review Ct Head Wo Contrast  05/20/2015   CLINICAL DATA:  Several falls during the past 2 weeks. No loss of consciousness. Generalized weakness.  EXAM: CT HEAD WITHOUT CONTRAST  TECHNIQUE: Contiguous axial images were obtained from the base of the skull through the vertex without intravenous contrast.  COMPARISON:  05/12/2015  FINDINGS: Similar findings of age advanced centralized atrophy with ex vacuo dilatation of the ventricular system, most conspicuously involving the fourth ventricle. Age advanced cerebellar volume loss with sulcal prominence. Scattered periventricular hypodensities compatible microvascular ischemic disease. Given background parenchymal abnormalities, there is no CT evidence of superimposed acute large territory infarct. No definite intraparenchymal or extra-axial mass or hemorrhage. Unchanged size and configuration of the ventricles and basilar cisterns. No midline shift. There is underpneumatization the bilateral frontal  sinuses. Polypoid mucosal thickening of the right maxillary sinus. The remaining paranasal sinuses and mastoid air cells are normally aerated. No air-fluid levels. Regional soft tissues appear normal. No displaced calvarial fracture.  IMPRESSION: 1. Microvascular ischemic disease without definite acute intracranial process. 2. Similar findings of age advanced cerebellar and centralized atrophy with commensurate ex vacuo dilatation of the ventricular system most conspicuously involving the fourth ventricle. Further evaluation could be performed with brain MRI as clinically indicated.   Electronically Signed   By: Sandi Mariscal M.D.   On: 05/20/2015 16:53   Mr Cervical Spine Wo Contrast  05/20/2015   CLINICAL DATA:  59 year old female with weakness and falling for the past 14 days. History of  neurosarcoidosis. Numbness buttock region. Symptoms worsening over the past 3 days. Subsequent encounter.  EXAM: MRI CERVICAL SPINE WITHOUT CONTRAST  TECHNIQUE: Multiplanar, multisequence MR imaging of the cervical spine was performed. No intravenous contrast was administered.  COMPARISON:  05/20/2015 and 05/12/2015 head CT.  FINDINGS: Exam is motion degraded.  Abnormal appearance of the cerebellum with marked atrophy. Possibility of primary neurodegenerative disorder of the cerebellum is raised. Similar type findings can be seen in this setting of seizures, treatment of seizures, malnutrition or excess alcohol use. On CT, patient is also noted to have prominent lateral ventricles. Patient may benefit from contrast-enhanced MR for further delineation (would help evaluate for intracranial neurosarcoidosis changes).  Curvature of the cervical spine. No obvious osseous edema or soft tissue edema to suggest bony injury or soft tissue injury. If bony injury is of high clinical concern, CT may then be considered for further delineation.  No focal cord signal abnormality.  Evaluation is otherwise limited secondary to the motion.  C2-3:  Negative.  C3-4:  Negative.  C4-5:  Bulge with mild spinal stenosis and minimal cord contact.  C5-6: Minimal to mild bulge. Minimal narrowing ventral aspect of the thecal sac.  C6-7:  Minimal bulge.  C7-T1:  Negative.  T1-2:  Negative.  T2-3:  Negative.  T3-4: Small Schmorl's node deformity superior endplate T4.  I4-5:  Limited imaging without obvious abnormality.  IMPRESSION: Exam is motion degraded.  Abnormal appearance of the cerebellum with marked atrophy. Possibility of primary neurodegenerative disorder of the cerebellum is raised. Similar type findings can be seen in this setting of seizures, treatment of seizures, malnutrition or excess alcohol use. On CT, patient is also noted to have prominent lateral ventricles. Patient may benefit from contrast-enhanced MR for further  delineation (would help evaluate for intracranial neurosarcoidosis changes).  Curvature of the cervical spine. No obvious osseous edema or soft tissue edema to suggest bony injury or soft tissue injury. If bony injury is of high clinical concern, CT may then be considered for further delineation.  No focal cord signal abnormality.  Evaluation is otherwise limited secondary to the motion.  C4-5 bulge with mild spinal stenosis and minimal cord contact.   Electronically Signed   By: Genia Del M.D.   On: 05/20/2015 21:33   Mr Lumbar Spine Wo Contrast  05/20/2015   CLINICAL DATA:  59 year old female with history of neuro sarcoidosis. Recent falls. Numbness buttock region. Subsequent encounter.  EXAM: MRI LUMBAR SPINE WITHOUT CONTRAST  TECHNIQUE: Multiplanar, multisequence MR imaging of the lumbar spine was performed. No intravenous contrast was administered.  COMPARISON:  05/12/2015 plain film exam.  FINDINGS: Last fully open disk space is labeled L5-S1. Present examination incorporates from T11-12 disc space through lower sacrum.  Exam is motion degraded.  Conus L1-2 level.  Fracture of the sacrum. This is centered at the S1-2 level with mild retropulsion and angulation at this level contributing to narrowing of the canal and contained nerve roots. Surrounding edema/hemorrhage.  T11-12 through L3-4 unremarkable.  L4-5: Mild disc degeneration. Bulge with small central disc protrusion. Minimal caudal extension. Slight impression ventral aspect of the thecal sac. Mild facet joint degenerative changes.  L5-S1:  Negative.  The bladder is full which may be related to patient not urinating as no compression of the conus is noted although other causes for urinary retention are not excluded.  Multiple gallstones.  IMPRESSION: Exam is motion degraded.  Fracture of the sacrum. This is centered at the S1-2 level with mild retropulsion and angulation at this level contributing to narrowing of the canal and contained nerve  roots. Surrounding edema/hemorrhage.  L4-5 mild disc degeneration. Bulge with small central disc protrusion. Minimal caudal extension. Slight impression ventral aspect of the thecal sac. Mild facet joint degenerative changes.  The bladder is full which may be related to patient not urinating as no compression of the conus is noted although other causes for urinary retention are not excluded.  Multiple gallstones.   Electronically Signed   By: Genia Del M.D.   On: 05/20/2015 22:03     EKG Interpretation None      MDM   Final diagnoses:  Leg weakness  Sacral fracture, closed, initial encounter  UTI (lower urinary tract infection)  Urinary retention    I spoke with the neurosurgeon on call, Dr. Diamantina Monks, who reviewed the MR and does not feel that the findings would cause cauda equina or need any decompression.  I consulted with Dr. Lorin Mercy with ortho who advised that the fracture will likely be conservative management.  The ortho team will see the pt in the morning.  Will consult the hospitalist for admission for PT and observation.  Foley was placed as pt was unable to urinate, over 600cc urine returned.  +for UTI, started on cipro, culture sent.    Malvin Johns, MD 05/21/15 [phone]

## 2015-05-20 NOTE — ED Notes (Addendum)
Patient presents to the ED from home where she lives alone with complaints of weakness and falls for @14  days.  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  Patient has bruising to right hip and arm.  Patient also has large external hemorrhoid.  Patient states she is incontinent of urine and stool.  Patient also states she cannot feel her bottom because it is numb.    On exam, patients lung sounds are clear in all lobes.  Heart sounds WNL, S1/S2, occasional irregular heart beat.  +2 radial and pedal pulses.  No pre-tibial and pedal edema.  Patient's abdomen is soft and non-tender to palpation.  Bowel sounds hypoactive.  CNIII intact, 74mm. Skin warm/dry.

## 2015-05-20 NOTE — ED Notes (Signed)
Pt sts that she has been falling often and feeling generalized weakness. Pt was recently seen in the ED for same. Negative for stroke.

## 2015-05-20 NOTE — Progress Notes (Signed)
[facility] ED CM received message from [facility] that pt is not safe at home and parents unable to care for her  On last d/c from [facility] ED parents were given a list of PDN to call to replace pt PDN staff she had in [facility] and had stated she would not be in [facility] until PDN was set up

## 2015-05-20 NOTE — ED Notes (Signed)
Bed: WA02 Expected date:  Expected time:  Means of arrival:  Comments: Zariel Novosel in waiting room.

## 2015-05-20 NOTE — ED Notes (Signed)
Pt. Attempted to use bed pan for urine but unsuccessful.

## 2015-05-21 ENCOUNTER — Inpatient Hospital Stay (HOSPITAL_COMMUNITY): Payer: Medicare Other

## 2015-05-21 ENCOUNTER — Encounter (HOSPITAL_COMMUNITY): Payer: Self-pay | Admitting: Internal Medicine

## 2015-05-21 DIAGNOSIS — W19XXXA Unspecified fall, initial encounter: Secondary | ICD-10-CM | POA: Diagnosis present

## 2015-05-21 DIAGNOSIS — G629 Polyneuropathy, unspecified: Secondary | ICD-10-CM | POA: Diagnosis present

## 2015-05-21 DIAGNOSIS — S22079A Unspecified fracture of T9-T10 vertebra, initial encounter for closed fracture: Secondary | ICD-10-CM | POA: Diagnosis not present

## 2015-05-21 DIAGNOSIS — F329 Major depressive disorder, single episode, unspecified: Secondary | ICD-10-CM | POA: Diagnosis present

## 2015-05-21 DIAGNOSIS — H548 Legal blindness, as defined in USA: Secondary | ICD-10-CM | POA: Diagnosis present

## 2015-05-21 DIAGNOSIS — R339 Retention of urine, unspecified: Secondary | ICD-10-CM | POA: Diagnosis not present

## 2015-05-21 DIAGNOSIS — R159 Full incontinence of feces: Secondary | ICD-10-CM | POA: Diagnosis present

## 2015-05-21 DIAGNOSIS — R531 Weakness: Secondary | ICD-10-CM | POA: Diagnosis not present

## 2015-05-21 DIAGNOSIS — N312 Flaccid neuropathic bladder, not elsewhere classified: Secondary | ICD-10-CM | POA: Diagnosis not present

## 2015-05-21 DIAGNOSIS — R29898 Other symptoms and signs involving the musculoskeletal system: Secondary | ICD-10-CM | POA: Diagnosis not present

## 2015-05-21 DIAGNOSIS — D8689 Sarcoidosis of other sites: Secondary | ICD-10-CM | POA: Diagnosis not present

## 2015-05-21 DIAGNOSIS — N390 Urinary tract infection, site not specified: Secondary | ICD-10-CM | POA: Diagnosis not present

## 2015-05-21 DIAGNOSIS — Z880 Allergy status to penicillin: Secondary | ICD-10-CM | POA: Diagnosis not present

## 2015-05-21 DIAGNOSIS — M4802 Spinal stenosis, cervical region: Secondary | ICD-10-CM | POA: Diagnosis present

## 2015-05-21 DIAGNOSIS — R200 Anesthesia of skin: Secondary | ICD-10-CM | POA: Diagnosis present

## 2015-05-21 DIAGNOSIS — S3210XS Unspecified fracture of sacrum, sequela: Secondary | ICD-10-CM | POA: Diagnosis not present

## 2015-05-21 DIAGNOSIS — K592 Neurogenic bowel, not elsewhere classified: Secondary | ICD-10-CM | POA: Diagnosis not present

## 2015-05-21 DIAGNOSIS — S3422XD Injury of nerve root of sacral spine, subsequent encounter: Secondary | ICD-10-CM | POA: Diagnosis not present

## 2015-05-21 DIAGNOSIS — Z8249 Family history of ischemic heart disease and other diseases of the circulatory system: Secondary | ICD-10-CM | POA: Diagnosis not present

## 2015-05-21 DIAGNOSIS — Z9181 History of falling: Secondary | ICD-10-CM | POA: Diagnosis not present

## 2015-05-21 DIAGNOSIS — S3210XA Unspecified fracture of sacrum, initial encounter for closed fracture: Secondary | ICD-10-CM | POA: Diagnosis present

## 2015-05-21 DIAGNOSIS — D869 Sarcoidosis, unspecified: Secondary | ICD-10-CM | POA: Diagnosis present

## 2015-05-21 DIAGNOSIS — I4510 Unspecified right bundle-branch block: Secondary | ICD-10-CM | POA: Diagnosis present

## 2015-05-21 DIAGNOSIS — R32 Unspecified urinary incontinence: Secondary | ICD-10-CM | POA: Diagnosis present

## 2015-05-21 DIAGNOSIS — S3422XS Injury of nerve root of sacral spine, sequela: Secondary | ICD-10-CM | POA: Diagnosis not present

## 2015-05-21 DIAGNOSIS — E039 Hypothyroidism, unspecified: Secondary | ICD-10-CM | POA: Diagnosis present

## 2015-05-21 DIAGNOSIS — Z79899 Other long term (current) drug therapy: Secondary | ICD-10-CM | POA: Diagnosis not present

## 2015-05-21 DIAGNOSIS — N319 Neuromuscular dysfunction of bladder, unspecified: Secondary | ICD-10-CM | POA: Diagnosis not present

## 2015-05-21 LAB — CBC
HCT: 29 % — ABNORMAL LOW (ref 36.0–46.0)
Hemoglobin: 9.9 g/dL — ABNORMAL LOW (ref 12.0–15.0)
MCH: 28.2 pg (ref 26.0–34.0)
MCHC: 34.1 g/dL (ref 30.0–36.0)
MCV: 82.6 fL (ref 78.0–100.0)
PLATELETS: 342 10*3/uL (ref 150–400)
RBC: 3.51 MIL/uL — ABNORMAL LOW (ref 3.87–5.11)
RDW: 14.8 % (ref 11.5–15.5)
WBC: 7.8 10*3/uL (ref 4.0–10.5)

## 2015-05-21 LAB — COMPREHENSIVE METABOLIC PANEL
ALBUMIN: 2.9 g/dL — AB (ref 3.5–5.0)
ALT: 22 U/L (ref 14–54)
ANION GAP: 9 (ref 5–15)
AST: 22 U/L (ref 15–41)
Alkaline Phosphatase: 134 U/L — ABNORMAL HIGH (ref 38–126)
BUN: 17 mg/dL (ref 6–20)
CHLORIDE: 107 mmol/L (ref 101–111)
CO2: 25 mmol/L (ref 22–32)
CREATININE: 0.48 mg/dL (ref 0.44–1.00)
Calcium: 8.8 mg/dL — ABNORMAL LOW (ref 8.9–10.3)
GFR calc non Af Amer: >60 mL/min (ref >60)
Glucose, Bld: 138 mg/dL — ABNORMAL HIGH (ref 65–99)
Potassium: 3.6 mmol/L (ref 3.5–5.1)
SODIUM: 141 mmol/L (ref 135–145)
TOTAL PROTEIN: 6 g/dL — AB (ref 6.5–8.1)
Total Bilirubin: 0.2 mg/dL — ABNORMAL LOW (ref 0.3–1.2)

## 2015-05-21 LAB — PHOSPHORUS: Phosphorus: 2.9 mg/dL (ref 2.5–4.6)

## 2015-05-21 LAB — MAGNESIUM: MAGNESIUM: 1.9 mg/dL (ref 1.7–2.4)

## 2015-05-21 LAB — TSH: TSH: 3.971 u[IU]/mL (ref 0.350–4.500)

## 2015-05-21 MED ORDER — TAMSULOSIN HCL 0.4 MG PO CAPS
0.4000 mg | ORAL_CAPSULE | Freq: Every day | ORAL | Status: DC
  Administered 2015-05-21 – 2015-05-24 (×4): 0.4 mg via ORAL
  Filled 2015-05-21 (×4): qty 1

## 2015-05-21 MED ORDER — CIPROFLOXACIN HCL 500 MG PO TABS
500.0000 mg | ORAL_TABLET | Freq: Once | ORAL | Status: AC
  Administered 2015-05-21: 500 mg via ORAL
  Filled 2015-05-21: qty 1

## 2015-05-21 MED ORDER — ONDANSETRON HCL 4 MG PO TABS: 4.0000 mg | ORAL_TABLET | Freq: Four times a day (QID) | ORAL | Status: DC | PRN

## 2015-05-21 MED ORDER — HYDROCODONE-ACETAMINOPHEN 5-325 MG PO TABS: 1.0000 | ORAL_TABLET | ORAL | Status: DC | PRN

## 2015-05-21 MED ORDER — ACETAMINOPHEN 650 MG RE SUPP: 650.0000 mg | Freq: Four times a day (QID) | RECTAL | Status: DC | PRN

## 2015-05-21 MED ORDER — FLUOXETINE HCL 20 MG PO CAPS
40.0000 mg | ORAL_CAPSULE | Freq: Every day | ORAL | Status: DC
  Administered 2015-05-21 – 2015-05-24 (×4): 40 mg via ORAL
  Filled 2015-05-21 (×4): qty 2

## 2015-05-21 MED ORDER — SODIUM CHLORIDE 0.9 % IV SOLN
INTRAVENOUS | Status: AC
  Administered 2015-05-21: 03:00:00 via INTRAVENOUS

## 2015-05-21 MED ORDER — GADOBENATE DIMEGLUMINE 529 MG/ML IV SOLN
11.0000 mL | Freq: Once | INTRAVENOUS | Status: AC | PRN
Start: 2015-05-21 — End: 2015-05-21
  Administered 2015-05-21: 11 mL via INTRAVENOUS

## 2015-05-21 MED ORDER — OXYCODONE HCL 5 MG PO TABS
5.0000 mg | ORAL_TABLET | ORAL | Status: DC | PRN
  Administered 2015-05-21 – 2015-05-22 (×6): 5 mg via ORAL
  Filled 2015-05-21 (×7): qty 1

## 2015-05-21 MED ORDER — ONDANSETRON HCL 4 MG/2ML IJ SOLN: 4.0000 mg | Freq: Four times a day (QID) | INTRAMUSCULAR | Status: DC | PRN

## 2015-05-21 MED ORDER — DEXTROSE 5 % IV SOLN
1.0000 g | INTRAVENOUS | Status: DC
  Administered 2015-05-21 – 2015-05-23 (×3): 1 g via INTRAVENOUS
  Filled 2015-05-21 (×4): qty 10

## 2015-05-21 MED ORDER — HEPARIN SODIUM (PORCINE) 5000 UNIT/ML IJ SOLN: 5000.0000 [IU] | Freq: Three times a day (TID) | INTRAMUSCULAR | Status: DC

## 2015-05-21 MED ORDER — ACETAMINOPHEN 325 MG PO TABS
650.0000 mg | ORAL_TABLET | Freq: Four times a day (QID) | ORAL | Status: DC | PRN
  Administered 2015-05-21: 650 mg via ORAL
  Filled 2015-05-21 (×2): qty 2

## 2015-05-21 MED ORDER — LEVOTHYROXINE SODIUM 100 MCG PO TABS
100.0000 ug | ORAL_TABLET | Freq: Every day | ORAL | Status: DC
  Administered 2015-05-21 – 2015-05-24 (×4): 100 ug via ORAL
  Filled 2015-05-21 (×5): qty 1

## 2015-05-21 NOTE — Clinical Social Work Note (Signed)
CSW attempted to complete psychosocial assessment but pt was sleeping and asked CSW to come back later  .Dede Query, LCSW [facility] Clinical Social Worker - Weekend Coverage cell #: [phone]

## 2015-05-21 NOTE — Consult Note (Signed)
Reason for Consult: Low back pain and incontinence status post fall Referring Physician: Dr Candiss Norse  CC: Low back pain  HPI: Jamie [name] is an unfortunate 59 y.o. female who is legally blind and has a long history of neurosarcoidosis. She is in the process of moving from [place] to [place] and has recently suffered several falls. She hit her head on the toilet bowl during one of the falls. Since that time she has had dull headaches. During another fall on May 23 she landed on her buttocks and injured her low back. She has had sacral pain and numbness as well as incontinence of bowel and bladder. She was seen at the [facility] emergency department on 05/12/2015 where she had negative plain films of the lumbar spine and pelvis. She also had a CT scan of the head without contrast that showed chronic ischemic changes with a possible component of normal pressure hydrocephalus.  The patient returned to the emergency department on 05/20/2015. At that time she had another CT scan of the head which showed chronic changes but no definite acute abnormalities. She also had MRIs of the cervical, lumbar, sacral spine. The cervical spine MRI revealed a C4-5 bulge with mild spinal stenosis and minimal cord contact. The lumbar sacral MRI revealed a fracture of the sacrum centered at the S1-2 level with mild retropulsion and angulation contributing to narrowing of the canal and the contained nerve roots. She was also noted to have some edema and hemorrhage in this area.   Dr. Diamantina Monks reviewed the MRI and did not feel that surgery was indicated. Dr. Lorin Mercy was also consulted and recommended conservative management.  Neurology has been asked to see the patient. An MRI of the thoracic spine has been ordered and is currently pending. The patient was also found to have a urinary tract infection and is currently on Rocephin, started today, along with one dose of Cipro. The patient rates her back pain as a 9  on a 1-10 scale and has had burning pain radiating down her legs. The patient's parents were present during most of the interview and the entire physical exam.      Past Medical History  Diagnosis Date  . 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 Sarcoidosis    hypothyroidism  History reviewed. No pertinent past surgical history.             Family history - both parents are alive and in their 2s. They remain very active. Her father is a long-term survivor of prostate cancer.  Social History:  reports that she has never smoked. She does not have any smokeless tobacco history on file. She reports that she does not drink alcohol or use illicit drugs.. She lives alone with an Environmental consultant. Her parents lived in [place].  Allergies  Allergen Reactions  . Penicillins Rash    Medications:  Scheduled: . cefTRIAXone (ROCEPHIN) IVPB 1 gram/50 mL D5W  1 g Intravenous Q24H  . FLUoxetine  40 mg Oral Daily  . levothyroxine  100 mcg Oral Daily  . tamsulosin  0.4 mg Oral Daily    ROS: History obtained from the patient  General ROS: negative for - chills, fatigue, fever, night sweats, weight gain or weight loss. Positive for recent headaches. Psychological ROS: negative for - behavioral disorder, hallucinations, memory difficulties, mood swings or suicidal ideation Ophthalmic ROS: negative for - blurry vision, double vision, eye pain or loss of vision ENT ROS: negative for - epistaxis, nasal discharge, oral  lesions, sore throat, tinnitus or vertigo Allergy and Immunology ROS: negative for - hives or itchy/watery eyes Hematological and Lymphatic ROS: negative for - bleeding problems, bruising or swollen lymph nodes Endocrine ROS: negative for - galactorrhea, hair pattern changes, polydipsia/polyuria or temperature intolerance Respiratory ROS: negative for - cough, hemoptysis, shortness of breath or wheezing Cardiovascular ROS: negative for - chest pain, dyspnea on exertion, edema or  irregular heartbeat Gastrointestinal ROS: negative for - abdominal pain, diarrhea, hematemesis, nausea/vomiting. Positive for stool incontinence and history of chronic constipation. Genito-Urinary ROS: negative for - dysuria, hematuria, or urinary frequency/urgency. Pos. for urinary incontinence and retention. Musculoskeletal ROS: negative for - joint swelling. Positive for muscle weakness related to her sarcoidosis. Neurological ROS: as noted in HPI. The patient has peripheral neuropathy related to her sarcoidosis. Dermatological ROS: negative for rash and skin lesion changes   Physical Examination: Blood pressure 110/65, pulse 80, temperature 97.9 F (36.6 C), temperature source Oral, resp. rate 16, height 5' 4.5" (1.638 m), weight 54.432 kg (120 lb), SpO2 100 %.   General - pleasant legally blind 59 year old female somewhat lethargic due recent pain medication administration. Heart - Regular rate and rhythm - no murmer Lungs - Clear to auscultation Abdomen - Soft - non tender Skin - Warm and dry   Neurologic Examination Mental Status: Alert, oriented, thought content appropriate.  Speech fluent without evidence of aphasia but very soft-spoken. Able to follow 3 step commands without difficulty. Cranial Nerves: II: The patient is legally blind. III,IV, VI: ptosis not present, extra-ocular motions intact bilaterally V,VII: smile symmetric, facial light touch sensation normal bilaterally VIII: hearing normal bilaterally IX,X: gag reflex present XI: bilateral shoulder shrug XII: midline tongue extension Motor: Right : Upper extremity   3/5    Left:     Upper extremity   3/5  Lower extremity   1/5     Lower extremity   1/5 Tone and bulk:normal tone throughout; no atrophy noted Sensory: Decreased sensation to light touch bilateral calcaneus areas, bottom of feet, and popliteal fossa Deep Tendon Reflexes: 3+ and symmetric throughout Plantars: Right: downgoing   Left:  downgoing Cerebellar: Patient unable to perform finger to nose secondary to weakness and limited range of motion. Bilateral tremor while attempting. Gait: Not tested for safety reasons.   Laboratory Studies:   Basic Metabolic Panel:  Recent Labs Lab 05/20/15 1807 05/21/15 0415  NA 141 141  K 3.8 3.6  CL 105 107  CO2 28 25  GLUCOSE 87 138*  BUN 20 17  CREATININE 0.55 0.48  CALCIUM 9.2 8.8*  MG  --  1.9  PHOS  --  2.9    Liver Function Tests:  Recent Labs Lab 05/20/15 1807 05/21/15 0415  AST 25 22  ALT 28 22  ALKPHOS 144* 134*  BILITOT 0.3 0.2*  PROT 6.8 6.0*  ALBUMIN 3.4* 2.9*   No results for input(s): LIPASE, AMYLASE in the last 168 hours. No results for input(s): AMMONIA in the last 168 hours.  CBC:  Recent Labs Lab 05/20/15 1807 05/21/15 0415  WBC 7.4 7.8  NEUTROABS 4.3  --   HGB 10.8* 9.9*  HCT 31.4* 29.0*  MCV 83.1 82.6  PLT 360 342    Cardiac Enzymes: No results for input(s): CKTOTAL, CKMB, CKMBINDEX, TROPONINI in the last 168 hours.  BNP: Invalid input(s): POCBNP  CBG: No results for input(s): GLUCAP in the last 168 hours.  Microbiology: No results found for this or any previous visit.  Coagulation Studies: No results for  input(s): LABPROT, INR in the last 72 hours.  Urinalysis:  Recent Labs Lab 05/20/15 2315  COLORURINE YELLOW  LABSPEC 1.021  PHURINE 6.0  GLUCOSEU NEGATIVE  HGBUR NEGATIVE  BILIRUBINUR NEGATIVE  KETONESUR NEGATIVE  PROTEINUR NEGATIVE  UROBILINOGEN 0.2  NITRITE POSITIVE*  LEUKOCYTESUR SMALL*    Lipid Panel:  No results found for: CHOL, TRIG, HDL, CHOLHDL, VLDL, LDLCALC  HgbA1C: No results found for: HGBA1C  Urine Drug Screen:  No results found for: LABOPIA, COCAINSCRNUR, LABBENZ, AMPHETMU, THCU, LABBARB  Alcohol Level: No results for input(s): ETH in the last 168 hours.  Other results: EKG: Sinus rhythm rate 92 bpm. Please refer to the formal cardiology reading for complete  details.  Imaging:   Ct Head Wo Contrast 05/20/2015    1. Microvascular ischemic disease without definite acute intracranial process.  2. Similar findings of age advanced cerebellar and centralized atrophy with commensurate ex vacuo dilatation of the ventricular system most conspicuously involving the fourth ventricle.  Further evaluation could be performed with brain MRI as clinically indicated.     Ct Head Wo Contrast 05/12/2015 Chronic ischemic changes. Prominence of the lateral ventricles and fourth ventricle. This may be due to a component of normal pressure hydrocephalus.   Mr Cervical Spine Wo Contrast 05/20/2015    Exam is motion degraded.   Abnormal appearance of the cerebellum with marked atrophy. Possibility of primary neurodegenerative disorder of the cerebellum is raised. Similar type findings can be seen in this setting of seizures, treatment of seizures, malnutrition or excess alcohol use. On CT, patient is also noted to have prominent lateral ventricles.  Patient may benefit from contrast-enhanced MR for further delineation (would help evaluate for intracranial neurosarcoidosis changes).   Curvature of the cervical spine. No obvious osseous edema or soft tissue edema to suggest bony injury or soft tissue injury. If bony injury is of high clinical concern, CT may then be considered for further delineation.  No focal cord signal abnormality.  Evaluation is otherwise limited secondary to the motion.  C4-5 bulge with mild spinal stenosis and minimal cord contact.       Mr Lumbar Spine Wo Contrast 05/20/2015    Exam is motion degraded.   Fracture of the sacrum. This is centered at the S1-2 level with mild retropulsion and angulation at this level contributing to narrowing of the canal and contained nerve roots. Surrounding edema/hemorrhage.   L4-5 mild disc degeneration. Bulge with small central disc protrusion. Minimal caudal extension. Slight impression ventral aspect of the  thecal sac. Mild facet joint degenerative changes.   The bladder is full which may be related to patient not urinating as no compression of the conus is noted although other causes for urinary retention are not excluded.   Multiple gallstones.      Mikey Bussing PA-C [facility] Pager [phone] 05/21/2015, 10:27 AM  Assessment/Plan: Unclear etiology diffuse weakness and urinary retention. Cervical and lumbosacral MRI studies do not show findings sufficient enough to be implicated as etiologies for her presenting complaints. Thoracic MRI is pending.  Recommend no changes in current management for now. We will await thoracic spine MRI. We will continue to follow this patient with you.

## 2015-05-21 NOTE — H&P (Signed)
PCP:  Olga Millers, MD in [facility]   Referring provider [place]   Chief Complaint:  Weakness of lower extremities urinary retension  HPI: Jamie [name] is a 59 y.o. 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   Patient originally from [facility] she moved here 1 week ago she has hx of Neurosarcoidosis.  Patient has trouble with balance for many years. She has had frequent falls. Following a bad fall on her buttocks on 5/23 she developed trouble with voiding when she stands up. She also noted some numbness of her lower extremities. She felt that her buttocks felt numb.  She reports burning pain radiating down her legs. Patient was in a process of moving to [place]. For the past week she needed help with daily ADL's. Her elderly parents were trying to take care of the patient but were unable to do so. She presented to ER.  MRI lumbar spine was done showing Fracture of the sacrum with some retropulsion. MRI of the cervical spine showed C4-5 buldge with mild spinal stenosis and minimal cord contact. NS has been consulted. ER discussed case with Dr. Diamantina Monks who felt there was no indication for operative intervention. Dr. Lorin Mercy with [facility] was also consulted and will see patient in AM. She was noted to have evidence of urinary retention and foley catheter was placed. UA worrisome for UTI  Hospitalist was called for admission for UTI, sacral fracture   Review of Systems:    Pertinent positives include:    gait abnormality,  Tingling and numbness of the feet,   Constitutional:  No weight loss, night sweats, Fevers, chills, fatigue, weight loss  HEENT:  No headaches, Difficulty swallowing,Tooth/dental problems,Sore throat,  No sneezing, itching, ear ache, nasal congestion, post nasal drip,  Cardio-vascular:  No chest pain, Orthopnea, PND, anasarca, dizziness, palpitations.no Bilateral lower extremity swelling  GI:  No heartburn,  indigestion, abdominal pain, nausea, vomiting, diarrhea, change in bowel habits, loss of appetite, melena, blood in stool, hematemesis Resp:  no shortness of breath at rest. No dyspnea on exertion, No excess mucus, no productive cough, No non-productive cough, No coughing up of blood.No change in color of mucus.No wheezing. Skin:  no rash or lesions. No jaundice GU:  no dysuria, change in color of urine, no urgency or frequency. No straining to urinate.  No flank pain.  Musculoskeletal:  No joint pain or no joint swelling. No decreased range of motion. No back pain.  Psych:  No change in mood or affect. No depression or anxiety. No memory loss.  Neuro: no localizing neurological complaints, nono weakness, no double vision, no  no slurred speech, no confusion  Otherwise ROS are negative except for above, 10 systems were reviewed  Past Medical History: Past Medical History  Diagnosis Date  . 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 Sarcoidosis    History reviewed. No pertinent past surgical history.   Medications: Prior to Admission medications   Medication Sig Start Date End Date Taking? Authorizing Provider  FLUoxetine (PROZAC) 40 MG capsule Take 40 mg by mouth daily. 03/17/15  Yes Historical Provider, MD  furosemide (LASIX) 40 MG tablet Take 40 mg by mouth daily as needed for fluid or edema.  07/27/14  Yes Historical Provider, MD  levothyroxine (SYNTHROID, LEVOTHROID) 100 MCG tablet Take 100 mcg by mouth daily. 03/15/15  Yes Historical Provider, MD  polyethylene glycol powder (MIRALAX) powder Take 17 g by mouth daily. 05/12/15  Yes  Jola Schmidt, MD  potassium chloride (K-DUR) 10 MEQ tablet Take 10 mEq by mouth daily.   Yes Historical Provider, MD    Allergies:   Allergies  Allergen Reactions  . Penicillins Rash    Social History:  Ambulatory  Independently  Lives at home alone,       reports that she has never smoked. She does not have any smokeless tobacco history on  file. She reports that she does not drink alcohol or use illicit drugs.    Family History: family history includes Hypertension in her father.    Physical Exam: Patient Vitals for the past 24 hrs:  BP Temp Temp src Pulse Resp SpO2 Height Weight  05/21/15 0018 118/62 mmHg - - 94 22 99 % 5' 4.5" (1.638 m) 54.432 kg (120 lb)  05/20/15 2311 121/81 mmHg - - 98 20 99 % - -  05/20/15 2250 124/71 mmHg 98.6 F (37 C) Oral 85 18 100 % - -  05/20/15 1922 121/69 mmHg 98.3 F (36.8 C) Oral 87 18 99 % - -  05/20/15 1713 118/69 mmHg - - 87 16 99 % - -  05/20/15 1534 115/56 mmHg - - 87 18 100 % - -  05/20/15 1306 97/77 mmHg 98.1 F (36.7 C) Oral 92 20 100 % - -    1. General:  in No Acute distress 2. Psychological: Alert and   Oriented 3. Head/ENT:     Dry Mucous Membranes                          Head Non traumatic, neck supple                          Normal   Dentition 4. SKIN:  decreased Skin turgor,  Skin clean Dry and intact no rash 5. Heart: Regular rate and rhythm no Murmur, Rub or gallop 6. Lungs: Clear to auscultation bilaterally, no wheezes or crackles   7. Abdomen: Soft, non-tender, Non distended 8. Lower extremities: no clubbing, cyanosis, or edema 9. Neurologically upper extremity appears to have normal strength. Lower extremity pedal strengths appear to be within normal limits but she feels she is into wedge pain to lift her legs bilaterally. Reports normal sensation bilaterally. Patient is status post porta catheter placement. Cranial nerves were more difficult to assess given blindness  10. MSK: Normal range of motion  body mass index is 20.29 kg/(m^2).   Labs on Admission:   Results for orders placed or performed during the hospital encounter of 05/20/15 (from the past 24 hour(s))  Comprehensive metabolic panel     Status: Abnormal   Collection Time: 05/20/15  6:07 PM  Result Value Ref Range   Sodium 141 135 - 145 mmol/L   Potassium 3.8 3.5 - 5.1 mmol/L   Chloride 105  101 - 111 mmol/L   CO2 28 22 - 32 mmol/L   Glucose, Bld 87 65 - 99 mg/dL   BUN 20 6 - 20 mg/dL   Creatinine, Ser 0.55 0.44 - 1.00 mg/dL   Calcium 9.2 8.9 - 10.3 mg/dL   Total Protein 6.8 6.5 - 8.1 g/dL   Albumin 3.4 (L) 3.5 - 5.0 g/dL   AST 25 15 - 41 U/L   ALT 28 14 - 54 U/L   Alkaline Phosphatase 144 (H) 38 - 126 U/L   Total Bilirubin 0.3 0.3 - 1.2 mg/dL   GFR calc non Af  Amer >60 >60 mL/min   GFR calc Af Amer >60 >60 mL/min   Anion gap 8 5 - 15  CBC with Differential     Status: Abnormal   Collection Time: 05/20/15  6:07 PM  Result Value Ref Range   WBC 7.4 4.0 - 10.5 K/uL   RBC 3.78 (L) 3.87 - 5.11 MIL/uL   Hemoglobin 10.8 (L) 12.0 - 15.0 g/dL   HCT 31.4 (L) 36.0 - 46.0 %   MCV 83.1 78.0 - 100.0 fL   MCH 28.6 26.0 - 34.0 pg   MCHC 34.4 30.0 - 36.0 g/dL   RDW 14.6 11.5 - 15.5 %   Platelets 360 150 - 400 K/uL   Neutrophils Relative % 59 43 - 77 %   Neutro Abs 4.3 1.7 - 7.7 K/uL   Lymphocytes Relative 27 12 - 46 %   Lymphs Abs 2.0 0.7 - 4.0 K/uL   Monocytes Relative 6 3 - 12 %   Monocytes Absolute 0.5 0.1 - 1.0 K/uL   Eosinophils Relative 7 (H) 0 - 5 %   Eosinophils Absolute 0.5 0.0 - 0.7 K/uL   Basophils Relative 1 0 - 1 %   Basophils Absolute 0.1 0.0 - 0.1 K/uL  Urinalysis, Routine w reflex microscopic     Status: Abnormal   Collection Time: 05/20/15 11:15 PM  Result Value Ref Range   Color, Urine YELLOW YELLOW   APPearance CLOUDY (A) CLEAR   Specific Gravity, Urine 1.021 1.005 - 1.030   pH 6.0 5.0 - 8.0   Glucose, UA NEGATIVE NEGATIVE mg/dL   Hgb urine dipstick NEGATIVE NEGATIVE   Bilirubin Urine NEGATIVE NEGATIVE   Ketones, ur NEGATIVE NEGATIVE mg/dL   Protein, ur NEGATIVE NEGATIVE mg/dL   Urobilinogen, UA 0.2 0.0 - 1.0 mg/dL   Nitrite POSITIVE (A) NEGATIVE   Leukocytes, UA SMALL (A) NEGATIVE  Urine microscopic-add on     Status: Abnormal   Collection Time: 05/20/15 11:15 PM  Result Value Ref Range   WBC, UA 11-20 <3 WBC/hpf   Bacteria, UA MANY (A) RARE    Urine-Other MUCOUS PRESENT     UA evidence of UTI  No results found for: HGBA1C  Estimated Creatinine Clearance: 66.6 mL/min (by C-G formula based on Cr of 0.55).  BNP (last 3 results) No results for input(s): PROBNP in the last 8760 hours.  Other results:  I have pearsonaly reviewed this: ECG REPORT  Rate: 92  Rhythm: RBBB ST&T Change: nonspecific ST segment changes   Filed Weights   05/21/15 0018  Weight: 54.432 kg (120 lb)     Cultures: No results found for: SDES, SPECREQUEST, CULT, REPTSTATUS   Radiological Exams on Admission: Ct Head Wo Contrast  05/20/2015   CLINICAL DATA:  Several falls during the past 2 weeks. No loss of consciousness. Generalized weakness.  EXAM: CT HEAD WITHOUT CONTRAST  TECHNIQUE: Contiguous axial images were obtained from the base of the skull through the vertex without intravenous contrast.  COMPARISON:  05/12/2015  FINDINGS: Similar findings of age advanced centralized atrophy with ex vacuo dilatation of the ventricular system, most conspicuously involving the fourth ventricle. Age advanced cerebellar volume loss with sulcal prominence. Scattered periventricular hypodensities compatible microvascular ischemic disease. Given background parenchymal abnormalities, there is no CT evidence of superimposed acute large territory infarct. No definite intraparenchymal or extra-axial mass or hemorrhage. Unchanged size and configuration of the ventricles and basilar cisterns. No midline shift. There is underpneumatization the bilateral frontal sinuses. Polypoid mucosal thickening of the right  maxillary sinus. The remaining paranasal sinuses and mastoid air cells are normally aerated. No air-fluid levels. Regional soft tissues appear normal. No displaced calvarial fracture.  IMPRESSION: 1. Microvascular ischemic disease without definite acute intracranial process. 2. Similar findings of age advanced cerebellar and centralized atrophy with commensurate ex vacuo  dilatation of the ventricular system most conspicuously involving the fourth ventricle. Further evaluation could be performed with brain MRI as clinically indicated.   Electronically Signed   By: Sandi Mariscal M.D.   On: 05/20/2015 16:53   Mr Cervical Spine Wo Contrast  05/20/2015   CLINICAL DATA:  59 year old female with weakness and falling for the past 14 days. History of neurosarcoidosis. Numbness buttock region. Symptoms worsening over the past 3 days. Subsequent encounter.  EXAM: MRI CERVICAL SPINE WITHOUT CONTRAST  TECHNIQUE: Multiplanar, multisequence MR imaging of the cervical spine was performed. No intravenous contrast was administered.  COMPARISON:  05/20/2015 and 05/12/2015 head CT.  FINDINGS: Exam is motion degraded.  Abnormal appearance of the cerebellum with marked atrophy. Possibility of primary neurodegenerative disorder of the cerebellum is raised. Similar type findings can be seen in this setting of seizures, treatment of seizures, malnutrition or excess alcohol use. On CT, patient is also noted to have prominent lateral ventricles. Patient may benefit from contrast-enhanced MR for further delineation (would help evaluate for intracranial neurosarcoidosis changes).  Curvature of the cervical spine. No obvious osseous edema or soft tissue edema to suggest bony injury or soft tissue injury. If bony injury is of high clinical concern, CT may then be considered for further delineation.  No focal cord signal abnormality.  Evaluation is otherwise limited secondary to the motion.  C2-3:  Negative.  C3-4:  Negative.  C4-5:  Bulge with mild spinal stenosis and minimal cord contact.  C5-6: Minimal to mild bulge. Minimal narrowing ventral aspect of the thecal sac.  C6-7:  Minimal bulge.  C7-T1:  Negative.  T1-2:  Negative.  T2-3:  Negative.  T3-4: Small Schmorl's node deformity superior endplate T4.  H7-4:  Limited imaging without obvious abnormality.  IMPRESSION: Exam is motion degraded.  Abnormal  appearance of the cerebellum with marked atrophy. Possibility of primary neurodegenerative disorder of the cerebellum is raised. Similar type findings can be seen in this setting of seizures, treatment of seizures, malnutrition or excess alcohol use. On CT, patient is also noted to have prominent lateral ventricles. Patient may benefit from contrast-enhanced MR for further delineation (would help evaluate for intracranial neurosarcoidosis changes).  Curvature of the cervical spine. No obvious osseous edema or soft tissue edema to suggest bony injury or soft tissue injury. If bony injury is of high clinical concern, CT may then be considered for further delineation.  No focal cord signal abnormality.  Evaluation is otherwise limited secondary to the motion.  C4-5 bulge with mild spinal stenosis and minimal cord contact.   Electronically Signed   By: Genia Del M.D.   On: 05/20/2015 21:33   Mr Lumbar Spine Wo Contrast  05/20/2015   CLINICAL DATA:  59 year old female with history of neuro sarcoidosis. Recent falls. Numbness buttock region. Subsequent encounter.  EXAM: MRI LUMBAR SPINE WITHOUT CONTRAST  TECHNIQUE: Multiplanar, multisequence MR imaging of the lumbar spine was performed. No intravenous contrast was administered.  COMPARISON:  05/12/2015 plain film exam.  FINDINGS: Last fully open disk space is labeled L5-S1. Present examination incorporates from T11-12 disc space through lower sacrum.  Exam is motion degraded.  Conus L1-2 level.  Fracture of the sacrum. This is  centered at the S1-2 level with mild retropulsion and angulation at this level contributing to narrowing of the canal and contained nerve roots. Surrounding edema/hemorrhage.  T11-12 through L3-4 unremarkable.  L4-5: Mild disc degeneration. Bulge with small central disc protrusion. Minimal caudal extension. Slight impression ventral aspect of the thecal sac. Mild facet joint degenerative changes.  L5-S1:  Negative.  The bladder is full which  may be related to patient not urinating as no compression of the conus is noted although other causes for urinary retention are not excluded.  Multiple gallstones.  IMPRESSION: Exam is motion degraded.  Fracture of the sacrum. This is centered at the S1-2 level with mild retropulsion and angulation at this level contributing to narrowing of the canal and contained nerve roots. Surrounding edema/hemorrhage.  L4-5 mild disc degeneration. Bulge with small central disc protrusion. Minimal caudal extension. Slight impression ventral aspect of the thecal sac. Mild facet joint degenerative changes.  The bladder is full which may be related to patient not urinating as no compression of the conus is noted although other causes for urinary retention are not excluded.  Multiple gallstones.   Electronically Signed   By: Genia Del M.D.   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 If repeated falls here with sacral pain and and urinary retention and incontinence was found to have urinary tract infection and sacral fracture per neurosurgery and orthopedics no indication for operative intervention   Present on Admission:  . Sacral fracture, closed - as per orthopedics. They will see patient in the morning. For now conservative treatment with pain management. PT/OT recommendations as per orthopedics. We'll likely need placement . Neurosarcoidosis - this is chronic ongoing condition likely contributing to her frequent false. Resulted in blindness. Patient will need to establish care with neurology while lives in [place] some of her neuropathy-like pain and discomfort could be also attributable to sarcoidosis  . UTI (lower urinary tract infection) we'll treat with Rocephin. Patient has history of penicillin mild allergies such as rash. We will monitor for any reaction to Rocephin but cross  reactivities only 2%.  . Urinary retention status post Foley may be due to pain discussed with neurosurgery by ER for felt symptoms were not explained by MRI findings  Prophylaxis: SCD   CODE STATUS:  FULL CODE  as per patient   Disposition:  likely will need placement for rehabilitation, patient interested in placement    Other plan as per orders.  I have spent a total of 53min on this admission  Everest Brod 05/21/2015, 1:06 AM  [facility]  Pager [phone]   after 2 AM please page floor coverage PA If 7AM-7PM, please contact the day team taking care of the patient  [email or web address]  Password [email or web address]

## 2015-05-21 NOTE — Progress Notes (Signed)
05/21/15 Nursing 0300   Pt given oxycodone 5 mg at this time for sacral pain. Unable to scan and chart in computer

## 2015-05-21 NOTE — Progress Notes (Signed)
Patient Demographics  Jamie [name], is a 59 y.o. female, DOB - 1956-07-06, UEK:[id number]  Admit date - 05/20/2015   Admitting Physician Toy Baker, MD  Outpatient Primary MD for the patient is Olga Millers, MD  LOS - 0   Chief Complaint  Patient presents with  . Fall  . Weakness        Subjective:   Jamie [name] today has, No headache, No chest pain, No abdominal pain - No Nausea, No new weakness tingling or numbness, No Cough - SOB. Continues to have generalized weakness worse in the lower extremities  Assessment & Plan    1. 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 Now recurrent falls with evidence of sacral fracture and L4-L5 disc G generation along with questionable hemorrhage/edema.  Neurosurgeon Dr. Kathyrn Sheriff has been consulted by ER he reviewed the MRI images and thought there is no surgical process. Orthopedic surgeon Dr. Lorin Mercy has also been consulted who will see the patient. I have also called neurology for their input as patient claims that her lower extremity weakness and urinary retention are getting worse. Question if there is some element of chronic sarcoid-related neuropathy with some acute component due to back injury. We'll await neurology input. T-spine MRI has been requested upon the request of neurologist Dr. Nicole Kindred.  PT to follow, and tinea supportive care.   2. Urinary retention. Due to #1 above. Continue Foley and Flomax. Will require outpatient urology follow-up.   3. Depression. Continue Prozac.   4. Hypothyroidism on Synthroid and TSH stable.   5. UTI. On Rocephin. Monitor cultures. This was prior to Foley placement.   6. Sarcoidosis with blindness and sarcoid-related neuropathy. Supportive care.     Code Status:  Full  Family Communication: None present  Disposition Plan: To be decided may require SNF   Consults    Neurology consulted, orthopedics consulted.   Neurosurgery and Dr. Kathyrn Sheriff called by her physician know surgical need for him.   Procedures   MRI C and L-spine. MRI T-spine ordered   DVT Prophylaxis  Heparin    Lab Results  Component Value Date   PLT 342 05/21/2015    Medications  Scheduled Meds: . cefTRIAXone (ROCEPHIN) IVPB 1 gram/50 mL D5W  1 g Intravenous Q24H  . FLUoxetine  40 mg Oral Daily  . levothyroxine  100 mcg Oral Daily   Continuous Infusions: . sodium chloride 50 mL/hr at 05/21/15 0321   PRN Meds:.acetaminophen **OR** acetaminophen, HYDROcodone-acetaminophen, ondansetron **OR** ondansetron (ZOFRAN) IV, oxyCODONE  Antibiotics    Anti-infectives    Start     Dose/Rate Route Frequency Ordered Stop   05/21/15 0400  cefTRIAXone (ROCEPHIN) 1 g in dextrose 5 % 50 mL IVPB     1 g 100 mL/hr over 30 Minutes Intravenous Every 24 hours 05/21/15 0254     05/21/15 0015  ciprofloxacin (CIPRO) tablet 500 mg     500 mg Oral  Once 05/21/15 0001 05/21/15 0017        Objective:   Filed Vitals:   05/21/15 0100 05/21/15 0130 05/21/15 0200 05/21/15 0242  BP: 125/72 105/66 124/76 110/65  Pulse:    80  Temp:    97.9 F (36.6 C)  TempSrc:    Oral  Resp: 13 18 15 16   Height:      Weight:      SpO2:    100%    Wt Readings from Last 3 Encounters:  05/21/15 54.432 kg (120 lb)  05/20/15 45.36 kg (100 lb)  05/20/15 45.36 kg (100 lb)     Intake/Output Summary (Last 24 hours) at 05/21/15 1145 Last data filed at 05/21/15 0930  Gross per 24 hour  Intake    240 ml  Output   1250 ml  Net  -1010 ml     Physical Exam  Awake Alert, Oriented X 3, No new F.N deficits, Normal affect, lower extremity strength 3/5 upper extremity strength 4/5 [place].AT,PERRAL Supple Neck,No JVD, No cervical lymphadenopathy appriciated.  Symmetrical Chest wall movement, Good air  movement bilaterally, CTAB RRR,No Gallops,Rubs or new Murmurs, No Parasternal Heave +ve B.Sounds, Abd Soft, No tenderness, No organomegaly appriciated, No rebound - guarding or rigidity. No Cyanosis, Clubbing or edema, No new Rash or bruise     Data Review   Micro Results No results found for this or any previous visit (from the past 240 hour(s)).  Radiology Reports Dg Lumbar Spine Complete  05/12/2015   CLINICAL DATA:  Low back pain following recent falls, initial encounter  EXAM: LUMBAR SPINE - COMPLETE 4+ VIEW  COMPARISON:  None.  FINDINGS: There is no evidence of lumbar spine fracture. Alignment is normal. Intervertebral disc spaces are maintained.  IMPRESSION: No acute abnormality noted.   Electronically Signed   By: Inez Catalina M.D.   On: 05/12/2015 09:43   Dg Pelvis 1-2 Views  05/12/2015   CLINICAL DATA:  Pain following fall  EXAM: PELVIS - 1-2 VIEW  COMPARISON:  None.  FINDINGS: There is no evidence of fracture or dislocation. Joint spaces appear intact. No erosive change.  IMPRESSION: No fracture or dislocation.  No appreciable arthropathy.   Electronically Signed   By: Lowella Grip III M.D.   On: 05/12/2015 09:43   Ct Head Wo Contrast  05/20/2015   CLINICAL DATA:  Several falls during the past 2 weeks. No loss of consciousness. Generalized weakness.  EXAM: CT HEAD WITHOUT CONTRAST  TECHNIQUE: Contiguous axial images were obtained from the base of the skull through the vertex without intravenous contrast.  COMPARISON:  05/12/2015  FINDINGS: Similar findings of age advanced centralized atrophy with ex vacuo dilatation of the ventricular system, most conspicuously involving the fourth ventricle. Age advanced cerebellar volume loss with sulcal prominence. Scattered periventricular hypodensities compatible microvascular ischemic disease. Given background parenchymal abnormalities, there is no CT evidence of superimposed acute large territory infarct. No definite intraparenchymal or  extra-axial mass or hemorrhage. Unchanged size and configuration of the ventricles and basilar cisterns. No midline shift. There is underpneumatization the bilateral frontal sinuses. Polypoid mucosal thickening of the right maxillary sinus. The remaining paranasal sinuses and mastoid air cells are normally aerated. No air-fluid levels. Regional soft tissues appear normal. No displaced calvarial fracture.  IMPRESSION: 1. Microvascular ischemic disease without definite acute intracranial process. 2. Similar findings of age advanced cerebellar and centralized atrophy with commensurate ex vacuo dilatation of the ventricular system most conspicuously involving the fourth ventricle. Further evaluation could be performed with brain MRI as clinically indicated.   Electronically Signed   By: Sandi Mariscal M.D.   On: 05/20/2015 16:53   Ct Head Wo Contrast  05/12/2015   CLINICAL DATA:  Multiple falls  EXAM: CT HEAD WITHOUT CONTRAST  TECHNIQUE: Contiguous axial images were obtained from the base of  the skull through the vertex without intravenous contrast.  COMPARISON:  None.  FINDINGS: The bony calvarium is intact. No gross soft tissue abnormality is noted. Lateral ventricles are prominent without significant cortical atrophy. Few scattered areas of decreased attenuation consistent chronic white matter ischemic change are noted. Prominence of the fourth ventricle is noted as well. No space-occupying mass lesion or acute hemorrhage is noted.  IMPRESSION: Chronic ischemic changes.  Prominence of the lateral ventricles and fourth ventricle. This may be due to a component of normal pressure hydrocephalus.   Electronically Signed   By: Inez Catalina M.D.   On: 05/12/2015 10:05   Mr Cervical Spine Wo Contrast  05/20/2015   CLINICAL DATA:  59 year old female with weakness and falling for the past 14 days. History of neurosarcoidosis. Numbness buttock region. Symptoms worsening over the past 3 days. Subsequent encounter.  EXAM: MRI  CERVICAL SPINE WITHOUT CONTRAST  TECHNIQUE: Multiplanar, multisequence MR imaging of the cervical spine was performed. No intravenous contrast was administered.  COMPARISON:  05/20/2015 and 05/12/2015 head CT.  FINDINGS: Exam is motion degraded.  Abnormal appearance of the cerebellum with marked atrophy. Possibility of primary neurodegenerative disorder of the cerebellum is raised. Similar type findings can be seen in this setting of seizures, treatment of seizures, malnutrition or excess alcohol use. On CT, patient is also noted to have prominent lateral ventricles. Patient may benefit from contrast-enhanced MR for further delineation (would help evaluate for intracranial neurosarcoidosis changes).  Curvature of the cervical spine. No obvious osseous edema or soft tissue edema to suggest bony injury or soft tissue injury. If bony injury is of high clinical concern, CT may then be considered for further delineation.  No focal cord signal abnormality.  Evaluation is otherwise limited secondary to the motion.  C2-3:  Negative.  C3-4:  Negative.  C4-5:  Bulge with mild spinal stenosis and minimal cord contact.  C5-6: Minimal to mild bulge. Minimal narrowing ventral aspect of the thecal sac.  C6-7:  Minimal bulge.  C7-T1:  Negative.  T1-2:  Negative.  T2-3:  Negative.  T3-4: Small Schmorl's node deformity superior endplate T4.  P3-7:  Limited imaging without obvious abnormality.  IMPRESSION: Exam is motion degraded.  Abnormal appearance of the cerebellum with marked atrophy. Possibility of primary neurodegenerative disorder of the cerebellum is raised. Similar type findings can be seen in this setting of seizures, treatment of seizures, malnutrition or excess alcohol use. On CT, patient is also noted to have prominent lateral ventricles. Patient may benefit from contrast-enhanced MR for further delineation (would help evaluate for intracranial neurosarcoidosis changes).  Curvature of the cervical spine. No obvious  osseous edema or soft tissue edema to suggest bony injury or soft tissue injury. If bony injury is of high clinical concern, CT may then be considered for further delineation.  No focal cord signal abnormality.  Evaluation is otherwise limited secondary to the motion.  C4-5 bulge with mild spinal stenosis and minimal cord contact.   Electronically Signed   By: Genia Del M.D.   On: 05/20/2015 21:33   Mr Lumbar Spine Wo Contrast  05/20/2015   CLINICAL DATA:  59 year old female with history of neuro sarcoidosis. Recent falls. Numbness buttock region. Subsequent encounter.  EXAM: MRI LUMBAR SPINE WITHOUT CONTRAST  TECHNIQUE: Multiplanar, multisequence MR imaging of the lumbar spine was performed. No intravenous contrast was administered.  COMPARISON:  05/12/2015 plain film exam.  FINDINGS: Last fully open disk space is labeled L5-S1. Present examination incorporates from T11-12 disc space  through lower sacrum.  Exam is motion degraded.  Conus L1-2 level.  Fracture of the sacrum. This is centered at the S1-2 level with mild retropulsion and angulation at this level contributing to narrowing of the canal and contained nerve roots. Surrounding edema/hemorrhage.  T11-12 through L3-4 unremarkable.  L4-5: Mild disc degeneration. Bulge with small central disc protrusion. Minimal caudal extension. Slight impression ventral aspect of the thecal sac. Mild facet joint degenerative changes.  L5-S1:  Negative.  The bladder is full which may be related to patient not urinating as no compression of the conus is noted although other causes for urinary retention are not excluded.  Multiple gallstones.  IMPRESSION: Exam is motion degraded.  Fracture of the sacrum. This is centered at the S1-2 level with mild retropulsion and angulation at this level contributing to narrowing of the canal and contained nerve roots. Surrounding edema/hemorrhage.  L4-5 mild disc degeneration. Bulge with small central disc protrusion. Minimal caudal  extension. Slight impression ventral aspect of the thecal sac. Mild facet joint degenerative changes.  The bladder is full which may be related to patient not urinating as no compression of the conus is noted although other causes for urinary retention are not excluded.  Multiple gallstones.   Electronically Signed   By: Genia Del M.D.   On: 05/20/2015 22:03     CBC  Recent Labs Lab 05/20/15 1807 05/21/15 0415  WBC 7.4 7.8  HGB 10.8* 9.9*  HCT 31.4* 29.0*  PLT 360 342  MCV 83.1 82.6  MCH 28.6 28.2  MCHC 34.4 34.1  RDW 14.6 14.8  LYMPHSABS 2.0  --   MONOABS 0.5  --   EOSABS 0.5  --   BASOSABS 0.1  --     Chemistries   Recent Labs Lab 05/20/15 1807 05/21/15 0415  NA 141 141  K 3.8 3.6  CL 105 107  CO2 28 25  GLUCOSE 87 138*  BUN 20 17  CREATININE 0.55 0.48  CALCIUM 9.2 8.8*  MG  --  1.9  AST 25 22  ALT 28 22  ALKPHOS 144* 134*  BILITOT 0.3 0.2*   ------------------------------------------------------------------------------------------------------------------ estimated creatinine clearance is 66.6 mL/min (by C-G formula based on Cr of 0.48). ------------------------------------------------------------------------------------------------------------------ No results for input(s): HGBA1C in the last 72 hours. ------------------------------------------------------------------------------------------------------------------ No results for input(s): CHOL, HDL, LDLCALC, TRIG, CHOLHDL, LDLDIRECT in the last 72 hours. ------------------------------------------------------------------------------------------------------------------  Recent Labs  05/21/15 0415  TSH 3.971   ------------------------------------------------------------------------------------------------------------------ No results for input(s): VITAMINB12, FOLATE, FERRITIN, TIBC, IRON, RETICCTPCT in the last 72 hours.  Coagulation profile No results for input(s): INR, PROTIME in the last 168  hours.  No results for input(s): DDIMER in the last 72 hours.  Cardiac Enzymes No results for input(s): CKMB, TROPONINI, MYOGLOBIN in the last 168 hours.  Invalid input(s): CK ------------------------------------------------------------------------------------------------------------------ Invalid input(s): POCBNP   Time Spent in minutes   35   Massa Pe K M.D on 05/21/2015 at 11:45 AM  Between 7am to 7pm - Pager - [phone]  After 7pm go to [email or web address] - password [email or web address]  [facility]   Office  [phone]

## 2015-05-21 NOTE — Care Management Note (Signed)
Case Management Note  Patient Details  Name: Jamie [name] MRN: [id number] Date of Birth: November 06, 1956  Subjective/Objective:Noted per ED CM note-Active [facility], but per [facility] patient not safe @ home,parents unable to care for her.PT cons in place.Await recommendations.                    Action/Plan:Monitor progress for d/c needs.   Expected Discharge Date:  05/23/15               Expected Discharge Plan:     In-House Referral:     Discharge planning Services     Post Acute Care Choice:    Choice offered to:     DME Arranged:    DME Agency:     HH Arranged:    HH Agency:     Status of Service:     Medicare Important Message Given:    Date Medicare IM Given:    Medicare IM give by:    Date Additional Medicare IM Given:    Additional Medicare Important Message give by:     If discussed at [place] of Stay Meetings, dates discussed:    Additional Comments:  Dessa Phi, RN 05/21/2015, 2:21 PM

## 2015-05-21 NOTE — Consult Note (Signed)
Reason for Consult:sacral fracture from fall 05/12/15 Referring Physician: P.  Candiss Norse MD  Jamie [name] is an 59 y.o. female.  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 Seen in ER, xrays neg but on retrospection and looking at MRI had sacral fracture S1-S2 with mild angulation which has not changed.   Past Medical History  Diagnosis Date  . 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 Sarcoidosis     History reviewed. No pertinent past surgical history.  Family History  Problem Relation Age of Onset  . Hypertension Father     Social History:  reports that she has never smoked. She does not have any smokeless tobacco history on file. She reports that she does not drink alcohol or use illicit drugs.  Allergies:  Allergies  Allergen Reactions  . Penicillins Rash    Medications: I have reviewed the patient's current medications.  Results for orders placed or performed during the hospital encounter of 05/20/15 (from the past 48 hour(s))  Comprehensive metabolic panel     Status: Abnormal   Collection Time: 05/20/15  6:07 PM  Result Value Ref Range   Sodium 141 135 - 145 mmol/L   Potassium 3.8 3.5 - 5.1 mmol/L   Chloride 105 101 - 111 mmol/L   CO2 28 22 - 32 mmol/L   Glucose, Bld 87 65 - 99 mg/dL   BUN 20 6 - 20 mg/dL   Creatinine, Ser 0.55 0.44 - 1.00 mg/dL   Calcium 9.2 8.9 - 10.3 mg/dL   Total Protein 6.8 6.5 - 8.1 g/dL   Albumin 3.4 (L) 3.5 - 5.0 g/dL   AST 25 15 - 41 U/L   ALT 28 14 - 54 U/L   Alkaline Phosphatase 144 (H) 38 - 126 U/L   Total Bilirubin 0.3 0.3 - 1.2 mg/dL   GFR calc non Af Amer >60 >60 mL/min   GFR calc Af Amer >60 >60 mL/min    Comment: (NOTE) The eGFR has been calculated using the CKD EPI equation. This calculation has not been validated in all clinical situations. eGFR's persistently <60 mL/min signify possible Chronic Kidney Disease.    Anion gap 8 5 - 15   CBC with Differential     Status: Abnormal   Collection Time: 05/20/15  6:07 PM  Result Value Ref Range   WBC 7.4 4.0 - 10.5 K/uL   RBC 3.78 (L) 3.87 - 5.11 MIL/uL   Hemoglobin 10.8 (L) 12.0 - 15.0 g/dL   HCT 31.4 (L) 36.0 - 46.0 %   MCV 83.1 78.0 - 100.0 fL   MCH 28.6 26.0 - 34.0 pg   MCHC 34.4 30.0 - 36.0 g/dL   RDW 14.6 11.5 - 15.5 %   Platelets 360 150 - 400 K/uL   Neutrophils Relative % 59 43 - 77 %   Neutro Abs 4.3 1.7 - 7.7 K/uL   Lymphocytes Relative 27 12 - 46 %   Lymphs Abs 2.0 0.7 - 4.0 K/uL   Monocytes Relative 6 3 - 12 %   Monocytes Absolute 0.5 0.1 - 1.0 K/uL   Eosinophils Relative 7 (H) 0 - 5 %   Eosinophils Absolute 0.5 0.0 - 0.7 K/uL   Basophils Relative 1 0 - 1 %   Basophils Absolute 0.1 0.0 - 0.1 K/uL  Urinalysis, Routine w reflex microscopic     Status: Abnormal   Collection Time: 05/20/15 11:15 PM  Result Value Ref Range   Color, Urine YELLOW YELLOW   APPearance CLOUDY (A) CLEAR   Specific Gravity, Urine 1.021 1.005 - 1.030   pH 6.0 5.0 - 8.0   Glucose, UA NEGATIVE NEGATIVE mg/dL   Hgb urine dipstick NEGATIVE NEGATIVE   Bilirubin Urine NEGATIVE NEGATIVE   Ketones, ur NEGATIVE NEGATIVE mg/dL   Protein, ur NEGATIVE NEGATIVE mg/dL   Urobilinogen, UA 0.2 0.0 - 1.0 mg/dL   Nitrite POSITIVE (A) NEGATIVE   Leukocytes, UA SMALL (A) NEGATIVE  Urine microscopic-add on     Status: Abnormal   Collection Time: 05/20/15 11:15 PM  Result Value Ref Range   WBC, UA 11-20 <3 WBC/hpf   Bacteria, UA MANY (A) RARE   Urine-Other MUCOUS PRESENT   Magnesium     Status: None   Collection Time: 05/21/15  4:15 AM  Result Value Ref Range   Magnesium 1.9 1.7 - 2.4 mg/dL  Phosphorus     Status: None   Collection Time: 05/21/15  4:15 AM  Result Value Ref Range   Phosphorus 2.9 2.5 - 4.6 mg/dL  TSH     Status: None   Collection Time: 05/21/15  4:15 AM  Result Value Ref Range   TSH 3.971 0.350 - 4.500 uIU/mL  Comprehensive metabolic panel     Status: Abnormal    Collection Time: 05/21/15  4:15 AM  Result Value Ref Range   Sodium 141 135 - 145 mmol/L   Potassium 3.6 3.5 - 5.1 mmol/L   Chloride 107 101 - 111 mmol/L   CO2 25 22 - 32 mmol/L   Glucose, Bld 138 (H) 65 - 99 mg/dL   BUN 17 6 - 20 mg/dL   Creatinine, Ser 0.48 0.44 - 1.00 mg/dL   Calcium 8.8 (L) 8.9 - 10.3 mg/dL   Total Protein 6.0 (L) 6.5 - 8.1 g/dL   Albumin 2.9 (L) 3.5 - 5.0 g/dL   AST 22 15 - 41 U/L   ALT 22 14 - 54 U/L   Alkaline Phosphatase 134 (H) 38 - 126 U/L   Total Bilirubin 0.2 (L) 0.3 - 1.2 mg/dL   GFR calc non Af Amer >60 >60 mL/min   GFR calc Af Amer >60 >60 mL/min    Comment: (NOTE) The eGFR has been calculated using the CKD EPI equation. This calculation has not been validated in all clinical situations. eGFR's persistently <60 mL/min signify possible Chronic Kidney Disease.    Anion gap 9 5 - 15  CBC     Status: Abnormal   Collection Time: 05/21/15  4:15 AM  Result Value Ref Range   WBC 7.8 4.0 - 10.5 K/uL   RBC 3.51 (L) 3.87 - 5.11 MIL/uL   Hemoglobin 9.9 (L) 12.0 - 15.0 g/dL   HCT 29.0 (L) 36.0 - 46.0 %   MCV 82.6 78.0 - 100.0 fL   MCH 28.2 26.0 - 34.0 pg   MCHC 34.1 30.0 - 36.0 g/dL   RDW 14.8 11.5 - 15.5 %   Platelets 342 150 - 400 K/uL    Ct Head Wo Contrast  05/20/2015   CLINICAL DATA:  Several falls during the past 2 weeks. No loss of consciousness. Generalized weakness.  EXAM: CT HEAD WITHOUT CONTRAST  TECHNIQUE: Contiguous axial images were obtained from the base of the skull through the vertex without intravenous contrast.  COMPARISON:  05/12/2015  FINDINGS: Similar findings of age advanced centralized atrophy with ex vacuo dilatation of the ventricular system, most conspicuously involving the fourth  ventricle. Age advanced cerebellar volume loss with sulcal prominence. Scattered periventricular hypodensities compatible microvascular ischemic disease. Given background parenchymal abnormalities, there is no CT evidence of superimposed acute large  territory infarct. No definite intraparenchymal or extra-axial mass or hemorrhage. Unchanged size and configuration of the ventricles and basilar cisterns. No midline shift. There is underpneumatization the bilateral frontal sinuses. Polypoid mucosal thickening of the right maxillary sinus. The remaining paranasal sinuses and mastoid air cells are normally aerated. No air-fluid levels. Regional soft tissues appear normal. No displaced calvarial fracture.  IMPRESSION: 1. Microvascular ischemic disease without definite acute intracranial process. 2. Similar findings of age advanced cerebellar and centralized atrophy with commensurate ex vacuo dilatation of the ventricular system most conspicuously involving the fourth ventricle. Further evaluation could be performed with brain MRI as clinically indicated.   Electronically Signed   By: Sandi Mariscal M.D.   On: 05/20/2015 16:53   Mr Cervical Spine Wo Contrast  05/20/2015   CLINICAL DATA:  59 year old female with weakness and falling for the past 14 days. History of neurosarcoidosis. Numbness buttock region. Symptoms worsening over the past 3 days. Subsequent encounter.  EXAM: MRI CERVICAL SPINE WITHOUT CONTRAST  TECHNIQUE: Multiplanar, multisequence MR imaging of the cervical spine was performed. No intravenous contrast was administered.  COMPARISON:  05/20/2015 and 05/12/2015 head CT.  FINDINGS: Exam is motion degraded.  Abnormal appearance of the cerebellum with marked atrophy. Possibility of primary neurodegenerative disorder of the cerebellum is raised. Similar type findings can be seen in this setting of seizures, treatment of seizures, malnutrition or excess alcohol use. On CT, patient is also noted to have prominent lateral ventricles. Patient may benefit from contrast-enhanced MR for further delineation (would help evaluate for intracranial neurosarcoidosis changes).  Curvature of the cervical spine. No obvious osseous edema or soft tissue edema to suggest bony  injury or soft tissue injury. If bony injury is of high clinical concern, CT may then be considered for further delineation.  No focal cord signal abnormality.  Evaluation is otherwise limited secondary to the motion.  C2-3:  Negative.  C3-4:  Negative.  C4-5:  Bulge with mild spinal stenosis and minimal cord contact.  C5-6: Minimal to mild bulge. Minimal narrowing ventral aspect of the thecal sac.  C6-7:  Minimal bulge.  C7-T1:  Negative.  T1-2:  Negative.  T2-3:  Negative.  T3-4: Small Schmorl's node deformity superior endplate T4.  L5-4:  Limited imaging without obvious abnormality.  IMPRESSION: Exam is motion degraded.  Abnormal appearance of the cerebellum with marked atrophy. Possibility of primary neurodegenerative disorder of the cerebellum is raised. Similar type findings can be seen in this setting of seizures, treatment of seizures, malnutrition or excess alcohol use. On CT, patient is also noted to have prominent lateral ventricles. Patient may benefit from contrast-enhanced MR for further delineation (would help evaluate for intracranial neurosarcoidosis changes).  Curvature of the cervical spine. No obvious osseous edema or soft tissue edema to suggest bony injury or soft tissue injury. If bony injury is of high clinical concern, CT may then be considered for further delineation.  No focal cord signal abnormality.  Evaluation is otherwise limited secondary to the motion.  C4-5 bulge with mild spinal stenosis and minimal cord contact.   Electronically Signed   By: Genia Del M.D.   On: 05/20/2015 21:33   Mr Lumbar Spine Wo Contrast  05/20/2015   CLINICAL DATA:  59 year old female with history of neuro sarcoidosis. Recent falls. Numbness buttock region. Subsequent encounter.  EXAM:  MRI LUMBAR SPINE WITHOUT CONTRAST  TECHNIQUE: Multiplanar, multisequence MR imaging of the lumbar spine was performed. No intravenous contrast was administered.  COMPARISON:  05/12/2015 plain film exam.  FINDINGS: Last  fully open disk space is labeled L5-S1. Present examination incorporates from T11-12 disc space through lower sacrum.  Exam is motion degraded.  Conus L1-2 level.  Fracture of the sacrum. This is centered at the S1-2 level with mild retropulsion and angulation at this level contributing to narrowing of the canal and contained nerve roots. Surrounding edema/hemorrhage.  T11-12 through L3-4 unremarkable.  L4-5: Mild disc degeneration. Bulge with small central disc protrusion. Minimal caudal extension. Slight impression ventral aspect of the thecal sac. Mild facet joint degenerative changes.  L5-S1:  Negative.  The bladder is full which may be related to patient not urinating as no compression of the conus is noted although other causes for urinary retention are not excluded.  Multiple gallstones.  IMPRESSION: Exam is motion degraded.  Fracture of the sacrum. This is centered at the S1-2 level with mild retropulsion and angulation at this level contributing to narrowing of the canal and contained nerve roots. Surrounding edema/hemorrhage.  L4-5 mild disc degeneration. Bulge with small central disc protrusion. Minimal caudal extension. Slight impression ventral aspect of the thecal sac. Mild facet joint degenerative changes.  The bladder is full which may be related to patient not urinating as no compression of the conus is noted although other causes for urinary retention are not excluded.  Multiple gallstones.   Electronically Signed   By: Genia Del M.D.   On: 05/20/2015 22:03    Review of Systems  Constitutional: Negative for fever and chills.  Eyes:       Blind from neurosarcoidosis  Greater than 10 yrs  Cardiovascular: Negative for chest pain.  Gastrointestinal: Negative for diarrhea.  Musculoskeletal: Positive for back pain and falls.  Skin: Negative for rash.  Endo/Heme/Allergies: Does not bruise/bleed easily.   Blood pressure 110/65, pulse 80, temperature 97.9 F (36.6 C), temperature source  Oral, resp. rate 16, height 5' 4.5" (1.638 m), weight 54.432 kg (120 lb), SpO2 100 %. Physical Exam  Constitutional: She is oriented to person, place, and time.  Answers questions, alert , eyes closed.   Neck: Neck supple.  Cardiovascular: Normal rate.   Respiratory: Effort normal.  GI: Soft.  Musculoskeletal:  Pain with sitting and sacral tenderness, intact toe flexion and extension  Neurological: She is alert and oriented to person, place, and time.    Assessment/Plan: Sacral fracture.    Will need SNF, OT and PT. Avoid sitting . Ambulate with assistance.   Damareon Lanni C 05/21/2015, 7:36 AM

## 2015-05-22 DIAGNOSIS — R2681 Unsteadiness on feet: Secondary | ICD-10-CM | POA: Insufficient documentation

## 2015-05-22 MED ORDER — POLYETHYLENE GLYCOL 3350 17 G PO PACK
17.0000 g | PACK | Freq: Two times a day (BID) | ORAL | Status: DC
  Administered 2015-05-22 – 2015-05-24 (×4): 17 g via ORAL

## 2015-05-22 MED ORDER — BISACODYL 5 MG PO TBEC
10.0000 mg | DELAYED_RELEASE_TABLET | Freq: Every day | ORAL | Status: DC
  Administered 2015-05-22 – 2015-05-24 (×2): 10 mg via ORAL
  Filled 2015-05-22 (×2): qty 2

## 2015-05-22 MED ORDER — HYDROCODONE-ACETAMINOPHEN 5-325 MG PO TABS
1.0000 | ORAL_TABLET | Freq: Four times a day (QID) | ORAL | Status: DC | PRN
  Administered 2015-05-23 – 2015-05-24 (×4): 1 via ORAL
  Filled 2015-05-22 (×4): qty 1

## 2015-05-22 NOTE — Clinical Social Work Note (Signed)
Clinical Social Work Assessment  Patient Details  Name: Jamie [name] MRN: [id number] Date of Birth: [birth year]  Date of referral:  05/22/15               Reason for consult:  Facility Placement                Permission sought to share information with:  Facility Art therapist granted to share information::     Name::        Agency::     Relationship::     Contact Information:     Housing/Transportation Living arrangements for the past 2 months:  Apartment Source of Information:  Parent Patient Interpreter Needed:  None Criminal Activity/Legal Involvement Pertinent to Current Situation/Hospitalization:    Significant Relationships:    Lives with:  Parents Do you feel safe going back to the place where you live?    Need for family participation in patient care:  Yes (Comment) (pt is blind and both her parents are involved with her care)  Care giving concerns:  None reported   Facilities manager / plan:  CSW met with pt and her parents at bedside.  Pt was involved with PT so both her parents provided information.  CSW explained role of CSW and prompted pt's parents to discuss her history and needs.  CSW explained the SNF process and also explained that RN case manager will discuss CIR if PT recommends.  CSW provided active and supportive listening and provided answers to pt's parents questions regarding CSW role and protocol for SNF/CIR/PT.  CSW will prepare paperwork necessary for pt's discharge facility and spoke with RN case manager regarding the parents interest in this service  Employment status:  Disabled (Comment on whether or not currently receiving Disability) (pt is blind) Insurance information:  Medicare PT Recommendations:  [place] / Referral to community resources:     Patient/Family's Response to care:  Both parents very pleasant and cooperative.  They provided information regarding pt who is blind but has been living  independently in her own apartment.  Pt had just moved here from [place] and the parents were looking into supports for her in the apt when she fell and needed hospitilization.  Pt's parents live in [place] but are retired and available for pt support.  The parents stated that pt's neurologist wants pt to go to CIR at discharge  Patient/Family's Understanding of and Emotional Response to Diagnosis, Current Treatment, and Prognosis:  Pt's parents appear to understand pt's diagnoses.  Unable to assess pt understanding at this time.  Emotional Assessment Appearance:  Appears younger than stated age Attitude/Demeanor/Rapport:  Lethargic Affect (typically observed):  Accepting Orientation:  Oriented to Self, Oriented to Place, Oriented to  Time, Oriented to Situation Alcohol / Substance use:    Psych involvement (Current and /or in the community):     Discharge Needs  Concerns to be addressed:    Readmission within the last 30 days:    Current discharge risk:    Barriers to Discharge:  No Barriers Identified   Carlean Jews, LCSW 05/22/2015, 1:54 PM

## 2015-05-22 NOTE — Progress Notes (Signed)
Occupational Therapy Evaluation Patient Details Name: Jamie [name] MRN: [id number] DOB: 18-Feb-1956 Today's Date: 05/22/2015    History of Present Illness Jamie [name] is a 59 y.o. female with history of neurosarcoidosis and blindness who presented with weakness and urinary retention, falls.   Clinical Impression   Patient presents with decreased ADL independence and safety due to the functional limitations below. She will benefit from acute OT to maximize independence and to facilitate a safe discharge plan.    Follow Up Recommendations  CIR (if pain does not limit her ability to do 3 hours/day) vs. SNF    Equipment Recommendations  Other (comment) (to be determined at next venue of care)    Recommendations for Other Services       Precautions / Restrictions Precautions Precautions: Fall Required Braces or Orthoses:  (TLSO order was placed today; has not arrived yet)      Mobility Bed Mobility                  Transfers                      Balance                                            ADL Overall ADL's : Needs assistance/impaired Eating/Feeding: Total assistance   Grooming: Maximal assistance           Upper Body Dressing : Total assistance;Sitting   Lower Body Dressing: Total assistance;Bed level               Functional mobility during ADLs: Total assistance;+2 for physical assistance;+2 for safety/equipment (bed mobility, supine to sit only) General ADL Comments: Patient is blind. Has had to have someone feed her in the hospital. Basically total care at this time. She reports, and her parents confirm, that she was independent  and living alone previously. Worked on bed mobility. Max A +2 roll to right, total A +2 supine to sit. Sat EOB total A initially, with brief period of min guard A. Total A +2 sit to supine. Total A +2 roll to left. Dependent to change gowns and change bed pad. Positioned comfortably in bed at end  of session. Pain limits her.      Vision     Perception     Praxis      Pertinent Vitals/Pain Pain Assessment: 0-10 Pain Score: 9  Pain Location: sacrum/low back, neck Pain Descriptors / Indicators: Throbbing Pain Intervention(s): Limited activity within patient's tolerance;Monitored during session     Hand Dominance Right   Extremity/Trunk Assessment Upper Extremity Assessment Upper Extremity Assessment: RUE deficits/detail;LUE deficits/detail RUE Deficits / Details: very limited shoulder AROM bilaterally; PROM to 90 degrees shoulder flexion. Able to perform elbow flex/ext and squeeze hand LUE Deficits / Details: very limited shoulder AROM bilaterally; PROM to 90 degrees shoulder flexion. Able to perform elbow flex/ext and squeeze hand   Lower Extremity Assessment Lower Extremity Assessment: Defer to PT evaluation       Communication Communication Communication: No difficulties (not very verbal; needed increased time to respond; allowed her parents to talk for her at times, had some difficulty reporting PLOF)   Cognition Arousal/Alertness: Lethargic Behavior During Therapy: Flat affect Overall Cognitive Status: Within Functional Limits for tasks assessed  General Comments       Exercises       Shoulder Instructions      Home Living Family/patient expects to be discharged to:: Private residence Living Arrangements: Alone   Type of Home: Apartment Home Access: Level entry     Home Layout: One level     Bathroom Shower/Tub: Occupational psychologist: Standard Bathroom Accessibility: Yes How Accessible: Accessible via walker Home Equipment: Bedside commode;Grab bars - tub/shower;Walker - 2 wheels;Shower seat;Cane - single point          Prior Functioning/Environment Level of Independence: Independent with assistive device(s) (needed assistance with ADLs for the past week per chart)             OT Diagnosis:  Generalized weakness;Acute pain   OT Problem List: Decreased strength;Decreased range of motion;Decreased activity tolerance;Impaired balance (sitting and/or standing);Impaired vision/perception;Decreased safety awareness;Pain;Impaired UE functional use   OT Treatment/Interventions: Self-care/ADL training;Therapeutic exercise;DME and/or AE instruction;Therapeutic activities;Patient/family education    OT Goals(Current goals can be found in the care plan section) Acute Rehab OT Goals Patient Stated Goal: none stated OT Goal Formulation: With patient/family Time For Goal Achievement: 06/05/15 Potential to Achieve Goals: Good ADL Goals Pt Will Perform Eating: sitting;bed level;with min assist Pt Will Perform Grooming: with min assist;sitting;bed level Pt Will Perform Upper Body Bathing: with min assist;sitting;bed level Pt Will Perform Upper Body Dressing: with min assist;sitting;bed level Pt Will Transfer to Toilet: stand pivot transfer;bedside commode;with mod assist Pt/caregiver will Perform Home Exercise Program: Increased ROM;Both right and left upper extremity;With minimal assist  OT Frequency: Min 2X/week   Barriers to D/C: Decreased caregiver support          Co-evaluation PT/OT/SLP Co-Evaluation/Treatment: Yes Reason for Co-Treatment: Complexity of the patient's impairments (multi-system involvement);For patient/therapist safety PT goals addressed during session: Mobility/safety with mobility;Balance OT goals addressed during session: Strengthening/ROM;ADL's and self-care      End of Session Nurse Communication: Mobility status (IV beeping)  Activity Tolerance: Patient tolerated treatment well Patient left: in bed;with call bell/phone within reach;with family/visitor present   Time: 1100-1142 OT Time Calculation (min): 42 min Charges:  OT General Charges $OT Visit: 1 Procedure OT Evaluation $Initial OT Evaluation Tier I: 1 Procedure G-Codes:    Denise Bramblett  A 05/26/2015, 12:03 PM

## 2015-05-22 NOTE — Progress Notes (Addendum)
Physical Therapy Treatment Patient Details Name: Jamie [name] MRN: [id number] DOB: 08/02/1956 Today's Date: 05/22/2015    History of Present Illness Jamie [name] is a 59 y.o. female with history of neurosarcoidosis and blindness who presented with weakness and urinary retention, falls.    PT Comments    Pt admitted with above diagnosis. Pt currently with functional limitations due to the deficits listed below (see PT Problem List).  Pt will benefit from skilled PT to increase their independence and safety with mobility to allow discharge to the venue listed below.   Pt family interested in [place]; feel pt may not be able to tolerate d/t pain levels but will continue to follow, may need longer term rehab at SNF level, discussed with pt and family   Follow Up Recommendations  SNF;CIR     Equipment Recommendations  Other (comment) (TBA)    Recommendations for Other Services       Precautions / Restrictions Precautions Precautions: Fall Required Braces or Orthoses: Spinal Brace, back precautions Spinal Brace: Thoracolumbosacral orthotic (not arrived yet) Other Brace/Splint: TLSO ordered by hospitalist, spoke with Dr. Nicole Kindred, neurologist today and he did not recommend bracing and reported to PT that it was ok to begin mobility, with fx's being "stable"    Mobility  Bed Mobility Overal bed mobility: Needs Assistance Bed Mobility: Rolling;Sidelying to Sit Rolling: +2 for physical assistance;Max assist;Total assist (max to R, Total to L) Sidelying to sit: +2 for physical assistance;Total assist       General bed mobility comments: pt +0%; multi-modal  cues for   Transfers                 General transfer comment: unable to test due to pain  Ambulation/Gait                 Stairs            Wheelchair Mobility    Modified Rankin (Stroke Patients Only)       Balance Overall balance assessment: Needs assistance Sitting-balance support: Feet supported;Feet  unsupported;Bilateral upper extremity supported;Single extremity supported Sitting balance-Leahy Scale: Zero Sitting balance - Comments: pt varied total assist to brief min/guard;  Postural control: Posterior lean;Right lateral lean                          Cognition Arousal/Alertness: Lethargic Behavior During Therapy: Flat affect Overall Cognitive Status: Difficult to assess Area of Impairment: Following commands;Problem solving       Following Commands: Follows one step commands with increased time     Problem Solving: Slow processing;Decreased initiation;Difficulty sequencing;Requires verbal cues;Requires tactile cues      Exercises      General Comments        Pertinent Vitals/Pain Pain Assessment: 0-10 Pain Score: 9  Pain Location: sacrum, low back Pain Descriptors / Indicators: Throbbing Pain Intervention(s): Limited activity within patient's tolerance;Monitored during session;Premedicated before session    Home Living Family/patient expects to be discharged to:: Private residence Living Arrangements: Alone (per neuro note-pt had an "aide" )   Type of Home: Apartment Home Access: Level entry   Home Layout: One level Home Equipment: Bedside commode;Grab bars - tub/shower;Walker - 2 wheels;Shower seat;Cane - single point Additional Comments: pt's elderly but very active and independent parents present for eval and  answered most questions, gave hx; They live in [place] but are planning a move to [place] in the fall of this year;    Prior Function  Level of Independence: Independent with assistive device(s)      Comments: per parent's report   PT Goals (current goals can now be found in the care plan section) Acute Rehab PT Goals Patient Stated Goal: none stated PT Goal Formulation: With patient Time For Goal Achievement: 05/29/15 Potential to Achieve Goals: Fair    Frequency  Min 3X/week    PT Plan      Co-evaluation   Reason for  Co-Treatment: Complexity of the patient's impairments (multi-system involvement) PT goals addressed during session: Mobility/safety with mobility OT goals addressed during session: Strengthening/ROM;ADL's and self-care     End of Session   Activity Tolerance: Patient limited by pain Patient left: in bed;with call bell/phone within reach;with family/visitor present     Time: 1100-1144 PT Time Calculation (min) (ACUTE ONLY): 44 min  Charges:  $Therapeutic Activity: 8-22 mins                    G Codes:      Mancel Lardizabal 20-Jun-2015, 2:31 PM

## 2015-05-22 NOTE — Progress Notes (Signed)
Patient Demographics  Jamie [name], is a 60 y.o. female, DOB - January 19, 1956, OVF:[id number]  Admit date - 05/20/2015   Admitting Physician Toy Baker, MD  Outpatient Primary MD for the patient is Olga Millers, MD  LOS - 1   Chief Complaint  Patient presents with  . Fall  . Weakness        Subjective:   Jamie [name] today has, No headache, No chest pain, No abdominal pain - No Nausea, No new weakness tingling or numbness, No Cough - SOB. Continues to have generalized weakness worse in the lower extremities, continues to have low back pain.  Assessment & Plan    1. 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 Now recurrent falls with evidence of sacral fracture and L4-L5 disc G generation along with questionable hemorrhage/edema.  Neurosurgeon Dr. Kathyrn Sheriff has been consulted by ER he reviewed the MRI images and thought there is no surgical process. Orthopedic surgeon Dr. Lorin Mercy has also been consulted in the ER and he has nothing to offer as well. I have consulted neurology who evaluated all the scans and recommend medical management as being done.   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 We'll apply TLSO brace, continue supportive care with pain control. PT evaluation. Most likely will require SNF placement.    2. Urinary retention. Due to #1 above. Continue Foley and Flomax. Will require outpatient urology follow-up.   3. Depression. Continue Prozac.   4. Hypothyroidism on Synthroid and TSH stable.   5. UTI. On Rocephin. Monitor cultures. This was prior to Foley placement.   6. Sarcoidosis with blindness and sarcoid-related neuropathy. Supportive care.     Code  Status: Full  Family Communication: Parents in detail bedside  Disposition Plan:   SNF   Consults    Neurology consulted, orthopedics consulted.   Neurosurgery and Dr. Kathyrn Sheriff called by her physician know surgical need for him.   Procedures   MRI C and L-spine. MRI T-spine ordered   DVT Prophylaxis  Heparin    Lab Results  Component Value Date   PLT 342 05/21/2015    Medications  Scheduled Meds: . cefTRIAXone (ROCEPHIN) IVPB 1 gram/50 mL D5W  1 g Intravenous Q24H  . FLUoxetine  40 mg Oral Daily  . levothyroxine  100 mcg Oral Daily  . tamsulosin  0.4 mg Oral Daily   Continuous Infusions:   PRN Meds:.acetaminophen **OR** [DISCONTINUED] acetaminophen, HYDROcodone-acetaminophen, [DISCONTINUED] ondansetron **OR** ondansetron (ZOFRAN) IV, oxyCODONE  Antibiotics    Anti-infectives    Start     Dose/Rate Route Frequency Ordered Stop   05/21/15 0400  cefTRIAXone (ROCEPHIN) 1 g in dextrose 5 % 50 mL IVPB     1 g 100 mL/hr over 30 Minutes Intravenous Every 24 hours 05/21/15 0254     05/21/15 0015  ciprofloxacin (CIPRO) tablet 500 mg     500 mg Oral  Once 05/21/15 0001 05/21/15 0017        Objective:   Filed Vitals:   05/21/15 1829 05/21/15 2051 05/22/15 0238 05/22/15 0543  BP: 99/43 94/47 107/47 97/44  Pulse: 87 80 85 82  Temp: 98.8 F (37.1 C) 99.4 F (37.4  C) 98.9 F (37.2 C) 99 F (37.2 C)  TempSrc: Oral Oral Oral Oral  Resp: 16 16 16 16   Height:      Weight:      SpO2: 99% 98% 100% 100%    Wt Readings from Last 3 Encounters:  05/21/15 54.432 kg (120 lb)  05/20/15 45.36 kg (100 lb)  05/20/15 45.36 kg (100 lb)     Intake/Output Summary (Last 24 hours) at 05/22/15 0904 Last data filed at 05/22/15 0543  Gross per 24 hour  Intake   1245 ml  Output    750 ml  Net    495 ml     Physical Exam  Awake Alert, Oriented X 3, No new F.N deficits, Normal affect, lower extremity strength 3/5 upper extremity strength 4/5 [place].AT,PERRAL Supple Neck,No  JVD, No cervical lymphadenopathy appriciated.  Symmetrical Chest wall movement, Good air movement bilaterally, CTAB RRR,No Gallops,Rubs or new Murmurs, No Parasternal Heave +ve B.Sounds, Abd Soft, No tenderness, No organomegaly appriciated, No rebound - guarding or rigidity. No Cyanosis, Clubbing or edema, No new Rash or bruise     Data Review   Micro Results Recent Results (from the past 240 hour(s))  Urine culture     Status: None (Preliminary result)   Collection Time: 05/20/15 11:15 PM  Result Value Ref Range Status   Specimen Description URINE, CATHETERIZED  Final   Special Requests Normal  Final   Colony Count   Final    >=100,000 COLONIES/ML Performed at [facility]    Culture   Final    ESCHERICHIA COLI Performed at [facility]    Report Status PENDING  Incomplete    Radiology Reports Dg Lumbar Spine Complete  05/12/2015   CLINICAL DATA:  Low back pain following recent falls, initial encounter  EXAM: LUMBAR SPINE - COMPLETE 4+ VIEW  COMPARISON:  None.  FINDINGS: There is no evidence of lumbar spine fracture. Alignment is normal. Intervertebral disc spaces are maintained.  IMPRESSION: No acute abnormality noted.   Electronically Signed   By: Inez Catalina M.D.   On: 05/12/2015 09:43   Dg Pelvis 1-2 Views  05/12/2015   CLINICAL DATA:  Pain following fall  EXAM: PELVIS - 1-2 VIEW  COMPARISON:  None.  FINDINGS: There is no evidence of fracture or dislocation. Joint spaces appear intact. No erosive change.  IMPRESSION: No fracture or dislocation.  No appreciable arthropathy.   Electronically Signed   By: Lowella Grip III M.D.   On: 05/12/2015 09:43   Ct Head Wo Contrast  05/20/2015   CLINICAL DATA:  Several falls during the past 2 weeks. No loss of consciousness. Generalized weakness.  EXAM: CT HEAD WITHOUT CONTRAST  TECHNIQUE: Contiguous axial images were obtained from the base of the skull through the vertex without intravenous contrast.  COMPARISON:   05/12/2015  FINDINGS: Similar findings of age advanced centralized atrophy with ex vacuo dilatation of the ventricular system, most conspicuously involving the fourth ventricle. Age advanced cerebellar volume loss with sulcal prominence. Scattered periventricular hypodensities compatible microvascular ischemic disease. Given background parenchymal abnormalities, there is no CT evidence of superimposed acute large territory infarct. No definite intraparenchymal or extra-axial mass or hemorrhage. Unchanged size and configuration of the ventricles and basilar cisterns. No midline shift. There is underpneumatization the bilateral frontal sinuses. Polypoid mucosal thickening of the right maxillary sinus. The remaining paranasal sinuses and mastoid air cells are normally aerated. No air-fluid levels. Regional soft tissues appear normal. No displaced calvarial fracture.  IMPRESSION: 1. Microvascular ischemic disease without definite acute intracranial process. 2. Similar findings of age advanced cerebellar and centralized atrophy with commensurate ex vacuo dilatation of the ventricular system most conspicuously involving the fourth ventricle. Further evaluation could be performed with brain MRI as clinically indicated.   Electronically Signed   By: Sandi Mariscal M.D.   On: 05/20/2015 16:53   Ct Head Wo Contrast  05/12/2015   CLINICAL DATA:  Multiple falls  EXAM: CT HEAD WITHOUT CONTRAST  TECHNIQUE: Contiguous axial images were obtained from the base of the skull through the vertex without intravenous contrast.  COMPARISON:  None.  FINDINGS: The bony calvarium is intact. No gross soft tissue abnormality is noted. Lateral ventricles are prominent without significant cortical atrophy. Few scattered areas of decreased attenuation consistent chronic white matter ischemic change are noted. Prominence of the fourth ventricle is noted as well. No space-occupying mass lesion or acute hemorrhage is noted.  IMPRESSION: Chronic  ischemic changes.  Prominence of the lateral ventricles and fourth ventricle. This may be due to a component of normal pressure hydrocephalus.   Electronically Signed   By: Inez Catalina M.D.   On: 05/12/2015 10:05   Mr Cervical Spine Wo Contrast  05/20/2015   CLINICAL DATA:  59 year old female with weakness and falling for the past 14 days. History of neurosarcoidosis. Numbness buttock region. Symptoms worsening over the past 3 days. Subsequent encounter.  EXAM: MRI CERVICAL SPINE WITHOUT CONTRAST  TECHNIQUE: Multiplanar, multisequence MR imaging of the cervical spine was performed. No intravenous contrast was administered.  COMPARISON:  05/20/2015 and 05/12/2015 head CT.  FINDINGS: Exam is motion degraded.  Abnormal appearance of the cerebellum with marked atrophy. Possibility of primary neurodegenerative disorder of the cerebellum is raised. Similar type findings can be seen in this setting of seizures, treatment of seizures, malnutrition or excess alcohol use. On CT, patient is also noted to have prominent lateral ventricles. Patient may benefit from contrast-enhanced MR for further delineation (would help evaluate for intracranial neurosarcoidosis changes).  Curvature of the cervical spine. No obvious osseous edema or soft tissue edema to suggest bony injury or soft tissue injury. If bony injury is of high clinical concern, CT may then be considered for further delineation.  No focal cord signal abnormality.  Evaluation is otherwise limited secondary to the motion.  C2-3:  Negative.  C3-4:  Negative.  C4-5:  Bulge with mild spinal stenosis and minimal cord contact.  C5-6: Minimal to mild bulge. Minimal narrowing ventral aspect of the thecal sac.  C6-7:  Minimal bulge.  C7-T1:  Negative.  T1-2:  Negative.  T2-3:  Negative.  T3-4: Small Schmorl's node deformity superior endplate T4.  O1-6:  Limited imaging without obvious abnormality.  IMPRESSION: Exam is motion degraded.  Abnormal appearance of the cerebellum  with marked atrophy. Possibility of primary neurodegenerative disorder of the cerebellum is raised. Similar type findings can be seen in this setting of seizures, treatment of seizures, malnutrition or excess alcohol use. On CT, patient is also noted to have prominent lateral ventricles. Patient may benefit from contrast-enhanced MR for further delineation (would help evaluate for intracranial neurosarcoidosis changes).  Curvature of the cervical spine. No obvious osseous edema or soft tissue edema to suggest bony injury or soft tissue injury. If bony injury is of high clinical concern, CT may then be considered for further delineation.  No focal cord signal abnormality.  Evaluation is otherwise limited secondary to the motion.  C4-5 bulge with mild spinal stenosis and minimal  cord contact.   Electronically Signed   By: Genia Del M.D.   On: 05/20/2015 21:33   Mr Lumbar Spine Wo Contrast  05/20/2015   CLINICAL DATA:  59 year old female with history of neuro sarcoidosis. Recent falls. Numbness buttock region. Subsequent encounter.  EXAM: MRI LUMBAR SPINE WITHOUT CONTRAST  TECHNIQUE: Multiplanar, multisequence MR imaging of the lumbar spine was performed. No intravenous contrast was administered.  COMPARISON:  05/12/2015 plain film exam.  FINDINGS: Last fully open disk space is labeled L5-S1. Present examination incorporates from T11-12 disc space through lower sacrum.  Exam is motion degraded.  Conus L1-2 level.  Fracture of the sacrum. This is centered at the S1-2 level with mild retropulsion and angulation at this level contributing to narrowing of the canal and contained nerve roots. Surrounding edema/hemorrhage.  T11-12 through L3-4 unremarkable.  L4-5: Mild disc degeneration. Bulge with small central disc protrusion. Minimal caudal extension. Slight impression ventral aspect of the thecal sac. Mild facet joint degenerative changes.  L5-S1:  Negative.  The bladder is full which may be related to patient not  urinating as no compression of the conus is noted although other causes for urinary retention are not excluded.  Multiple gallstones.  IMPRESSION: Exam is motion degraded.  Fracture of the sacrum. This is centered at the S1-2 level with mild retropulsion and angulation at this level contributing to narrowing of the canal and contained nerve roots. Surrounding edema/hemorrhage.  L4-5 mild disc degeneration. Bulge with small central disc protrusion. Minimal caudal extension. Slight impression ventral aspect of the thecal sac. Mild facet joint degenerative changes.  The bladder is full which may be related to patient not urinating as no compression of the conus is noted although other causes for urinary retention are not excluded.  Multiple gallstones.   Electronically Signed   By: Genia Del M.D.   On: 05/20/2015 22:03     CBC  Recent Labs Lab 05/20/15 1807 05/21/15 0415  WBC 7.4 7.8  HGB 10.8* 9.9*  HCT 31.4* 29.0*  PLT 360 342  MCV 83.1 82.6  MCH 28.6 28.2  MCHC 34.4 34.1  RDW 14.6 14.8  LYMPHSABS 2.0  --   MONOABS 0.5  --   EOSABS 0.5  --   BASOSABS 0.1  --     Chemistries   Recent Labs Lab 05/20/15 1807 05/21/15 0415  NA 141 141  K 3.8 3.6  CL 105 107  CO2 28 25  GLUCOSE 87 138*  BUN 20 17  CREATININE 0.55 0.48  CALCIUM 9.2 8.8*  MG  --  1.9  AST 25 22  ALT 28 22  ALKPHOS 144* 134*  BILITOT 0.3 0.2*   ------------------------------------------------------------------------------------------------------------------ estimated creatinine clearance is 66.6 mL/min (by C-G formula based on Cr of 0.48). ------------------------------------------------------------------------------------------------------------------ No results for input(s): HGBA1C in the last 72 hours. ------------------------------------------------------------------------------------------------------------------ No results for input(s): CHOL, HDL, LDLCALC, TRIG, CHOLHDL, LDLDIRECT in the last 72  hours. ------------------------------------------------------------------------------------------------------------------  Recent Labs  05/21/15 0415  TSH 3.971   ------------------------------------------------------------------------------------------------------------------ No results for input(s): VITAMINB12, FOLATE, FERRITIN, TIBC, IRON, RETICCTPCT in the last 72 hours.  Coagulation profile No results for input(s): INR, PROTIME in the last 168 hours.  No results for input(s): DDIMER in the last 72 hours.  Cardiac Enzymes No results for input(s): CKMB, TROPONINI, MYOGLOBIN in the last 168 hours.  Invalid input(s): CK ------------------------------------------------------------------------------------------------------------------ Invalid input(s): POCBNP   Time Spent in minutes   35   Lala Lund K M.D on 05/22/2015 at 9:04 AM  Between  7am to 7pm - Pager - [phone]  After 7pm go to [email or web address] - password [facility]  [facility]   Office  [phone]

## 2015-05-22 NOTE — Progress Notes (Signed)
Subjective: Movement of lower extremities has improved slightly. Severity of pain is unchanged, requiring round-the-clock analgesia medications. Thoracic spine MRI showed a T10 to the body fraction without retropulsion or canal compromise.  Objective: Current vital signs: BP 96/45 mmHg  Pulse 91  Temp(Src) 98.7 F (37.1 C) (Oral)  Resp 15  Ht 5' 4.5" (1.638 m)  Wt 54.432 kg (120 lb)  BMI 20.29 kg/m2  SpO2 100%  Neurologic Exam: Drowsy, having received analgesia medication just prior to my arrival. She was well-oriented to time as well as place. Speech was slightly slurred, but commensurate with her level of alertness. Right upper extremities was normal proximally and distally. Tremulousness with movement was minimal. Movement of lower extremities proximally was limited due to pain. Quadriceps strength was at least 4/5 bilaterally with support of lower extremities at the knee. Tibialis anterior strength was normal bilaterally. Gastrocnemius was 5/5 on the left and 4/5 on the right. Deep tendon reflexes were 2+ and symmetrical. Plantar responses were flexor bilaterally. Sensory exam was normal.  Medications: I have reviewed the patient's current medications.  Assessment/Plan: Patient is showing improvement in her extremity strength bilaterally. Movement of lower extremities is in part limited due to sacral pain. I suspect she experienced diffuse, nonfocal concussion-like trauma to her spinal cord when she fell. No compressive lesion has been demonstrated on MRI studies.   Agree with physical therapy intervention and recommend consultation for inpatient rehabilitation. No further neurodiagnostic studies are indicated. I agree with urology evaluation if urinary incontinence persists.  C.R. Nicole Kindred, MD [facility] [phone]  05/22/2015  4:39 PM

## 2015-05-22 NOTE — Progress Notes (Signed)
Rehab Admissions Coordinator Note:  Patient was screened by Cleatrice Burke for appropriateness for an Inpatient Acute Rehab Consult per PT and OT recommendations.  At this time, we are recommending Inpatient Rehab consult. Pain limiting factors noted. Please place order for full assessment .  Cleatrice Burke 05/22/2015, 4:17 PM  I can be reached at [phone].

## 2015-05-23 ENCOUNTER — Ambulatory Visit: Payer: Medicare Other | Admitting: Family

## 2015-05-23 DIAGNOSIS — N390 Urinary tract infection, site not specified: Secondary | ICD-10-CM

## 2015-05-23 DIAGNOSIS — R339 Retention of urine, unspecified: Secondary | ICD-10-CM

## 2015-05-23 DIAGNOSIS — S3422XA Injury of nerve root of sacral spine, initial encounter: Secondary | ICD-10-CM

## 2015-05-23 DIAGNOSIS — S3210XS Unspecified fracture of sacrum, sequela: Secondary | ICD-10-CM

## 2015-05-23 DIAGNOSIS — R29898 Other symptoms and signs involving the musculoskeletal system: Secondary | ICD-10-CM

## 2015-05-23 DIAGNOSIS — D8689 Sarcoidosis of other sites: Secondary | ICD-10-CM

## 2015-05-23 LAB — URINE CULTURE
Colony Count: >=100000
Special Requests: NORMAL

## 2015-05-23 MED ORDER — BISACODYL 10 MG RE SUPP
10.0000 mg | Freq: Once | RECTAL | Status: AC
  Administered 2015-05-23: 10 mg via RECTAL
  Filled 2015-05-23: qty 1

## 2015-05-23 NOTE — Progress Notes (Signed)
Patient Demographics  Jamie [name], is a 59 y.o. female, DOB - 11/17/1956, RFX:[id number]  Admit date - 05/20/2015   Admitting Physician Toy Baker, MD  Outpatient Primary MD for the patient is Olga Millers, MD  LOS - 2   Chief Complaint  Patient presents with  . Fall  . Weakness        Subjective:   [name] today has, No headache, No chest pain, No abdominal pain - No Nausea, No new weakness tingling or numbness, No Cough - SOB. Continues to have generalized weakness worse in the lower extremities, continues to have low back pain.  Assessment & Plan    1. 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 Now recurrent falls with evidence of sacral fracture and L4-L5 disc G generation along with questionable hemorrhage/edema.  Neurosurgeon Dr. Kathyrn Sheriff has been consulted by ER he reviewed the MRI images and thought there is no surgical process. Orthopedic surgeon Dr. Lorin Mercy has also been consulted in the ER and he has nothing to offer as well. I have consulted neurology who evaluated all the scans and recommend medical management as being done.   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 We'll apply TLSO brace, continue supportive care with pain control. PT evaluation done, CIR vs SNF.    2. Urinary retention. Due to #1 above. Had Foley with Flomax. Will give a trial of removing Foley catheter on 05/23/2015,  if fails we'll replace Foley with require outpatient urology follow-up.   3. Depression. Continue Prozac.   4. Hypothyroidism on Synthroid and TSH stable.   5. UTI. On Rocephin. Monitor cultures. This was prior to Foley placement.   6. Sarcoidosis with blindness  and sarcoid-related neuropathy. Supportive care.     Code Status: Full  Family Communication: Parents in detail bedside  Disposition Plan:   SNF tomorrow   Consults    Neurology consulted, orthopedics consulted.   Neurosurgery and Dr. Kathyrn Sheriff called by her physician know surgical need for him.   Procedures   MRI C and L-spine. MRI T-spine ordered   DVT Prophylaxis  Heparin    Lab Results  Component Value Date   PLT 342 05/21/2015    Medications  Scheduled Meds: . bisacodyl  10 mg Oral Daily  . bisacodyl  10 mg Rectal Once  . cefTRIAXone (ROCEPHIN) IVPB 1 gram/50 mL D5W  1 g Intravenous Q24H  . FLUoxetine  40 mg Oral Daily  . levothyroxine  100 mcg Oral Daily  . polyethylene glycol  17 g Oral BID  . tamsulosin  0.4 mg Oral Daily   Continuous Infusions:   PRN Meds:.acetaminophen **OR** [DISCONTINUED] acetaminophen, HYDROcodone-acetaminophen, [DISCONTINUED] ondansetron **OR** ondansetron (ZOFRAN) IV, oxyCODONE  Antibiotics    Anti-infectives    Start     Dose/Rate Route Frequency Ordered Stop   05/21/15 0400  cefTRIAXone (ROCEPHIN) 1 g in dextrose 5 % 50 mL IVPB     1 g 100 mL/hr over 30 Minutes Intravenous Every 24 hours 05/21/15 0254 05/26/15 0359   05/21/15 0015  ciprofloxacin (CIPRO) tablet 500 mg     500 mg Oral  Once 05/21/15 0001 05/21/15 0017  Objective:   Filed Vitals:   05/22/15 0543 05/22/15 1501 05/22/15 2300 05/23/15 0540  BP: 97/44 96/45 126/52 113/55  Pulse: 82 91 106 80  Temp: 99 F (37.2 C) 98.7 F (37.1 C) 97 F (36.1 C) 98.5 F (36.9 C)  TempSrc: Oral Oral Oral Oral  Resp: 16 15 16 16   Height:      Weight:      SpO2: 100% 100% 97% 100%    Wt Readings from Last 3 Encounters:  05/21/15 54.432 kg (120 lb)  05/20/15 45.36 kg (100 lb)  05/20/15 45.36 kg (100 lb)     Intake/Output Summary (Last 24 hours) at 05/23/15 1003 Last data filed at 05/23/15 0542  Gross per 24 hour  Intake    320 ml  Output    625 ml    Net   -305 ml     Physical Exam  Awake Alert, Oriented X 3, No new F.N deficits, Normal affect, lower extremity strength 3/5 upper extremity strength 4/5 [place].AT,PERRAL Supple Neck,No JVD, No cervical lymphadenopathy appriciated.  Symmetrical Chest wall movement, Good air movement bilaterally, CTAB RRR,No Gallops,Rubs or new Murmurs, No Parasternal Heave +ve B.Sounds, Abd Soft, No tenderness, No organomegaly appriciated, No rebound - guarding or rigidity. No Cyanosis, Clubbing or edema, No new Rash or bruise     Data Review   Micro Results Recent Results (from the past 240 hour(s))  Urine culture     Status: None (Preliminary result)   Collection Time: 05/20/15 11:15 PM  Result Value Ref Range Status   Specimen Description URINE, CATHETERIZED  Final   Special Requests Normal  Final   Colony Count   Final    >=100,000 COLONIES/ML Performed at [facility]    Culture   Final    ESCHERICHIA COLI Performed at [facility]    Report Status PENDING  Incomplete    Radiology Reports Dg Lumbar Spine Complete  05/12/2015   CLINICAL DATA:  Low back pain following recent falls, initial encounter  EXAM: LUMBAR SPINE - COMPLETE 4+ VIEW  COMPARISON:  None.  FINDINGS: There is no evidence of lumbar spine fracture. Alignment is normal. Intervertebral disc spaces are maintained.  IMPRESSION: No acute abnormality noted.   Electronically Signed   By: Inez Catalina M.D.   On: 05/12/2015 09:43   Dg Pelvis 1-2 Views  05/12/2015   CLINICAL DATA:  Pain following fall  EXAM: PELVIS - 1-2 VIEW  COMPARISON:  None.  FINDINGS: There is no evidence of fracture or dislocation. Joint spaces appear intact. No erosive change.  IMPRESSION: No fracture or dislocation.  No appreciable arthropathy.   Electronically Signed   By: Lowella Grip III M.D.   On: 05/12/2015 09:43   Ct Head Wo Contrast  05/20/2015   CLINICAL DATA:  Several falls during the past 2 weeks. No loss of consciousness.  Generalized weakness.  EXAM: CT HEAD WITHOUT CONTRAST  TECHNIQUE: Contiguous axial images were obtained from the base of the skull through the vertex without intravenous contrast.  COMPARISON:  05/12/2015  FINDINGS: Similar findings of age advanced centralized atrophy with ex vacuo dilatation of the ventricular system, most conspicuously involving the fourth ventricle. Age advanced cerebellar volume loss with sulcal prominence. Scattered periventricular hypodensities compatible microvascular ischemic disease. Given background parenchymal abnormalities, there is no CT evidence of superimposed acute large territory infarct. No definite intraparenchymal or extra-axial mass or hemorrhage. Unchanged size and configuration of the ventricles and basilar cisterns. No midline shift. There is  underpneumatization the bilateral frontal sinuses. Polypoid mucosal thickening of the right maxillary sinus. The remaining paranasal sinuses and mastoid air cells are normally aerated. No air-fluid levels. Regional soft tissues appear normal. No displaced calvarial fracture.  IMPRESSION: 1. Microvascular ischemic disease without definite acute intracranial process. 2. Similar findings of age advanced cerebellar and centralized atrophy with commensurate ex vacuo dilatation of the ventricular system most conspicuously involving the fourth ventricle. Further evaluation could be performed with brain MRI as clinically indicated.   Electronically Signed   By: Sandi Mariscal M.D.   On: 05/20/2015 16:53   Ct Head Wo Contrast  05/12/2015   CLINICAL DATA:  Multiple falls  EXAM: CT HEAD WITHOUT CONTRAST  TECHNIQUE: Contiguous axial images were obtained from the base of the skull through the vertex without intravenous contrast.  COMPARISON:  None.  FINDINGS: The bony calvarium is intact. No gross soft tissue abnormality is noted. Lateral ventricles are prominent without significant cortical atrophy. Few scattered areas of decreased attenuation  consistent chronic white matter ischemic change are noted. Prominence of the fourth ventricle is noted as well. No space-occupying mass lesion or acute hemorrhage is noted.  IMPRESSION: Chronic ischemic changes.  Prominence of the lateral ventricles and fourth ventricle. This may be due to a component of normal pressure hydrocephalus.   Electronically Signed   By: Inez Catalina M.D.   On: 05/12/2015 10:05   Mr Cervical Spine Wo Contrast  05/20/2015   CLINICAL DATA:  59 year old female with weakness and falling for the past 14 days. History of neurosarcoidosis. Numbness buttock region. Symptoms worsening over the past 3 days. Subsequent encounter.  EXAM: MRI CERVICAL SPINE WITHOUT CONTRAST  TECHNIQUE: Multiplanar, multisequence MR imaging of the cervical spine was performed. No intravenous contrast was administered.  COMPARISON:  05/20/2015 and 05/12/2015 head CT.  FINDINGS: Exam is motion degraded.  Abnormal appearance of the cerebellum with marked atrophy. Possibility of primary neurodegenerative disorder of the cerebellum is raised. Similar type findings can be seen in this setting of seizures, treatment of seizures, malnutrition or excess alcohol use. On CT, patient is also noted to have prominent lateral ventricles. Patient may benefit from contrast-enhanced MR for further delineation (would help evaluate for intracranial neurosarcoidosis changes).  Curvature of the cervical spine. No obvious osseous edema or soft tissue edema to suggest bony injury or soft tissue injury. If bony injury is of high clinical concern, CT may then be considered for further delineation.  No focal cord signal abnormality.  Evaluation is otherwise limited secondary to the motion.  C2-3:  Negative.  C3-4:  Negative.  C4-5:  Bulge with mild spinal stenosis and minimal cord contact.  C5-6: Minimal to mild bulge. Minimal narrowing ventral aspect of the thecal sac.  C6-7:  Minimal bulge.  C7-T1:  Negative.  T1-2:  Negative.  T2-3:   Negative.  T3-4: Small Schmorl's node deformity superior endplate T4.  Z6-1:  Limited imaging without obvious abnormality.  IMPRESSION: Exam is motion degraded.  Abnormal appearance of the cerebellum with marked atrophy. Possibility of primary neurodegenerative disorder of the cerebellum is raised. Similar type findings can be seen in this setting of seizures, treatment of seizures, malnutrition or excess alcohol use. On CT, patient is also noted to have prominent lateral ventricles. Patient may benefit from contrast-enhanced MR for further delineation (would help evaluate for intracranial neurosarcoidosis changes).  Curvature of the cervical spine. No obvious osseous edema or soft tissue edema to suggest bony injury or soft tissue injury. If bony injury  is of high clinical concern, CT may then be considered for further delineation.  No focal cord signal abnormality.  Evaluation is otherwise limited secondary to the motion.  C4-5 bulge with mild spinal stenosis and minimal cord contact.   Electronically Signed   By: Genia Del M.D.   On: 05/20/2015 21:33   Mr Lumbar Spine Wo Contrast  05/20/2015   CLINICAL DATA:  59 year old female with history of neuro sarcoidosis. Recent falls. Numbness buttock region. Subsequent encounter.  EXAM: MRI LUMBAR SPINE WITHOUT CONTRAST  TECHNIQUE: Multiplanar, multisequence MR imaging of the lumbar spine was performed. No intravenous contrast was administered.  COMPARISON:  05/12/2015 plain film exam.  FINDINGS: Last fully open disk space is labeled L5-S1. Present examination incorporates from T11-12 disc space through lower sacrum.  Exam is motion degraded.  Conus L1-2 level.  Fracture of the sacrum. This is centered at the S1-2 level with mild retropulsion and angulation at this level contributing to narrowing of the canal and contained nerve roots. Surrounding edema/hemorrhage.  T11-12 through L3-4 unremarkable.  L4-5: Mild disc degeneration. Bulge with small central disc  protrusion. Minimal caudal extension. Slight impression ventral aspect of the thecal sac. Mild facet joint degenerative changes.  L5-S1:  Negative.  The bladder is full which may be related to patient not urinating as no compression of the conus is noted although other causes for urinary retention are not excluded.  Multiple gallstones.  IMPRESSION: Exam is motion degraded.  Fracture of the sacrum. This is centered at the S1-2 level with mild retropulsion and angulation at this level contributing to narrowing of the canal and contained nerve roots. Surrounding edema/hemorrhage.  L4-5 mild disc degeneration. Bulge with small central disc protrusion. Minimal caudal extension. Slight impression ventral aspect of the thecal sac. Mild facet joint degenerative changes.  The bladder is full which may be related to patient not urinating as no compression of the conus is noted although other causes for urinary retention are not excluded.  Multiple gallstones.   Electronically Signed   By: Genia Del M.D.   On: 05/20/2015 22:03     CBC  Recent Labs Lab 05/20/15 1807 05/21/15 0415  WBC 7.4 7.8  HGB 10.8* 9.9*  HCT 31.4* 29.0*  PLT 360 342  MCV 83.1 82.6  MCH 28.6 28.2  MCHC 34.4 34.1  RDW 14.6 14.8  LYMPHSABS 2.0  --   MONOABS 0.5  --   EOSABS 0.5  --   BASOSABS 0.1  --     Chemistries   Recent Labs Lab 05/20/15 1807 05/21/15 0415  NA 141 141  K 3.8 3.6  CL 105 107  CO2 28 25  GLUCOSE 87 138*  BUN 20 17  CREATININE 0.55 0.48  CALCIUM 9.2 8.8*  MG  --  1.9  AST 25 22  ALT 28 22  ALKPHOS 144* 134*  BILITOT 0.3 0.2*   ------------------------------------------------------------------------------------------------------------------ estimated creatinine clearance is 66.6 mL/min (by C-G formula based on Cr of 0.48). ------------------------------------------------------------------------------------------------------------------ No results for input(s): HGBA1C in the last 72  hours. ------------------------------------------------------------------------------------------------------------------ No results for input(s): CHOL, HDL, LDLCALC, TRIG, CHOLHDL, LDLDIRECT in the last 72 hours. ------------------------------------------------------------------------------------------------------------------  Recent Labs  05/21/15 0415  TSH 3.971   ------------------------------------------------------------------------------------------------------------------ No results for input(s): VITAMINB12, FOLATE, FERRITIN, TIBC, IRON, RETICCTPCT in the last 72 hours.  Coagulation profile No results for input(s): INR, PROTIME in the last 168 hours.  No results for input(s): DDIMER in the last 72 hours.  Cardiac Enzymes No results for input(s):  CKMB, TROPONINI, MYOGLOBIN in the last 168 hours.  Invalid input(s): CK ------------------------------------------------------------------------------------------------------------------ Invalid input(s): POCBNP   Time Spent in minutes   35   Ali Mclaurin K M.D on 05/23/2015 at 10:03 AM  Between 7am to 7pm - Pager - [phone]  After 7pm go to [email or web address] - password [email or web address]  [facility]   Office  [phone]

## 2015-05-23 NOTE — Progress Notes (Signed)
Date:  May 23, 2015 U.R. performed for needs and level of care. Neuro deficits on admission. Will continue to follow for Case Management needs.  Velva Harman, RN, BSN, [place]   [phone]

## 2015-05-23 NOTE — Consult Note (Signed)
Physical Medicine and Rehabilitation Consult Reason for Consult: Sacral fracture with history of sarcoidosis Referring Physician: [facility]    HPI: Jamie [name] is a 59 y.o. right handed female with history of legally blind, sarcoidosis. Patient originally from [place] moved to the [place] area approximately 1 week ago he closer to her family. Her parents are elderly and lives in [place] has lived alone independently with occasional assistive device prior to admission and had a hired caregiver couple hours a day to assist with ADLs.Since moving to [facility] she fell on 05/12/2015 with complaints of low back pain.In ED cranial CT scan after a fall 5/26 showed prominence of the fourth ventricle X-rays showed no evidence of fracture. Presented 05/20/2015 with recent fall onto her buttocks as well as numerous falls in the past.. On one of her latest fall she fell struck her head on the toilet. Noted complaints of dull headaches. Complains of sacral pain and numbness as well as bouts of incontinence of bowel bladder. . Repeat cranial CT scan 05/20/2015 shows microvascular ischemic disease without definite acute intracranial process. MRI lumbar spine showed sacral fracture S1-S2 with mild angulation, Thoracic spine MRI demonstrated a small T10 superior endplate compression which appeared acute. Cervical spine MRI showed no compressive lesions no evidence of fractures. Orthopedic services Dr. Lorin Mercy consulted advise conservative care.TLSO ordered Neurology consulted for instability of gait with workup felt to be suspect nonfocal concussion and again advise conservative care. Flomax has been added for urinary retention. Occupational therapy evaluation completed 05/22/2015 with recommendations of physical medicine rehabilitation consult.  Patient complains of numbness on the bottom of the feet as well as difficulty urinating and poor sensation for bowel movements  Review of Systems    Constitutional: Negative for fever and chills.  Eyes:       Patient is legally blind  Respiratory: Negative for stridor.   Cardiovascular: Positive for leg swelling.  Gastrointestinal: Positive for nausea.       Bowel incontinence  Genitourinary:       Urinary incontinence  Musculoskeletal: Positive for back pain, joint pain and falls.  Neurological: Positive for sensory change, weakness and headaches.  Psychiatric/Behavioral: Positive for depression.   Past Medical History  Diagnosis Date  . 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 Sarcoidosis    History reviewed. No pertinent past surgical history. Family History  Problem Relation Age of Onset  . Hypertension Father    Social History:  reports that she has never smoked. She does not have any smokeless tobacco history on file. She reports that she does not drink alcohol or use illicit drugs. Allergies:  Allergies  Allergen Reactions  . Penicillins Rash   Medications Prior to Admission  Medication Sig Dispense Refill  . FLUoxetine (PROZAC) 40 MG capsule Take 40 mg by mouth daily.    . furosemide (LASIX) 40 MG tablet Take 40 mg by mouth daily as needed for fluid or edema.     Marland Kitchen levothyroxine (SYNTHROID, LEVOTHROID) 100 MCG tablet Take 100 mcg by mouth daily.    . polyethylene glycol powder (MIRALAX) powder Take 17 g by mouth daily. 255 g 0  . potassium chloride (K-DUR) 10 MEQ tablet Take 10 mEq by mouth daily.      Home: Home Living Family/patient expects to be discharged to:: Private residence Living Arrangements: Alone (per neuro note-pt had an "aide" ) Type of Home: Apartment Home Access: Level entry Home Layout: One level Home Equipment: Bedside commode, Grab bars - tub/shower,  Walker - 2 wheels, Shower seat, Kasandra Knudsen - single point Additional Comments: pt's elderly but very active and independent parents present for eval and  answered most questions, gave hx; They live in [place] but are planning a move to [place]  in the fall of this year;  Functional History: Prior Function Level of Independence: Independent with assistive device(s) Comments: per parent's report Functional Status:  Mobility: Bed Mobility Overal bed mobility: Needs Assistance Bed Mobility: Rolling, Sidelying to Sit Rolling: +2 for physical assistance, Max assist, Total assist (max to R, Total to L) Sidelying to sit: +2 for physical assistance, Total assist General bed mobility comments: pt +0%; multi-modal  cues for  Transfers General transfer comment: unable to test due to pain      ADL: ADL Overall ADL's : Needs assistance/impaired Eating/Feeding: Total assistance Grooming: Maximal assistance Upper Body Dressing : Total assistance, Sitting Lower Body Dressing: Total assistance, Bed level Functional mobility during ADLs: Total assistance, +2 for physical assistance, +2 for safety/equipment (bed mobility, supine to sit only) General ADL Comments: Patient is blind. Has had to have someone feed her in the hospital. Basically total care at this time. She reports, and her parents confirm, that she was independent  and living alone previously. Worked on bed mobility. Max A +2 roll to right, total A +2 supine to sit. Sat EOB total A initially, with brief period of min guard A. Total A +2 sit to supine. Total A +2 roll to left. Dependent to change gowns and change bed pad. Positioned comfortably in bed at end of session. Pain limits her.   Cognition: Cognition Overall Cognitive Status: Difficult to assess Cognition Arousal/Alertness: Lethargic Behavior During Therapy: Flat affect Overall Cognitive Status: Difficult to assess Area of Impairment: Following commands, Problem solving Following Commands: Follows one step commands with increased time Problem Solving: Slow processing, Decreased initiation, Difficulty sequencing, Requires verbal cues, Requires tactile cues  Blood pressure 113/55, pulse 80, temperature 98.5 F (36.9  C), temperature source Oral, resp. rate 16, height 5' 4.5" (1.638 m), weight 54.432 kg (120 lb), SpO2 100 %. Physical Exam  Constitutional: She is oriented to person, place, and time. She appears well-developed.  HENT:  Head: Normocephalic.  Eyes:  Patient can see some shadows  Neck: Normal range of motion. Neck supple. No thyromegaly present.  Cardiovascular: Normal rate and regular rhythm.   Respiratory: Effort normal and breath sounds normal. No respiratory distress.  GI: Soft. Bowel sounds are normal. She exhibits no distension.  Neurological: She is alert and oriented to person, place, and time.  Skin: Skin is warm and dry.  Reduced sensation on the plantar surface of both feet intact sensation on the dorsal surface Motor strength is 4+ bilateral deltoid, biceps, triceps, grip 3 minus bilateral hip flexor and knee extensor 4 at the ankle dorsiflexor and plantar flexor Tenderness to palpation along the lower thoracic paraspinal and spinous process area as well as along the sacrum  No results found for this or any previous visit (from the past 24 hour(s)). Mr Thoracic Spine W Wo Contrast  05/21/2015   CLINICAL DATA:  [place].  Back pain.  EXAM: MRI THORACIC SPINE WITHOUT AND WITH CONTRAST  TECHNIQUE: Multiplanar and multiecho pulse sequences of the thoracic spine were obtained without and with intravenous contrast.  CONTRAST:  32mL MULTIHANCE GADOBENATE DIMEGLUMINE 529 MG/ML IV SOLN  COMPARISON:  None.  FINDINGS: Normal alignment of the thoracic vertebral bodies. There is a mild superior endplate fracture of the T10 vertebral body.  No retropulsion or canal compromise. The other thoracic vertebral bodies are normal. The thoracic spinal cord is normal. No cord lesions or syrinx. The facets are normally aligned. No foraminal lesions. No thoracic disc protrusions or canal stenosis.  IMPRESSION: Mild superior endplate fracture of G64 without retropulsion or canal compromise.   Electronically Signed    By: Marijo Sanes M.D.   On: 05/21/2015 13:49    Assessment/Plan: Diagnosis: Sacral fracture with sacral nerve root injury causing neurogenic bowel and bladder 1. Does the need for close, 24 hr/day medical supervision in concert with the patient's rehab needs make it unreasonable for this patient to be served in a less intensive setting? Yes 2. Co-Morbidities requiring supervision/potential complications: Patient needs retraining of neurogenic bowel and bladder, also has T10 compression fracture, neurogenic pain in the lower extremities as well as musculoskeletal pain which inhibits motion 3. Due to bladder management, bowel management, safety, skin/wound care, disease management, medication administration, pain management and patient education, does the patient require 24 hr/day rehab nursing? Yes 4. Does the patient require coordinated care of a physician, rehab nurse, PT (1-2 hrs/day, 5 days/week) and OT (11-2 hrs/day, 5 days/week) to address physical and functional deficits in the context of the above medical diagnosis(es)? Yes Addressing deficits in the following areas: balance, endurance, locomotion, strength, transferring, bowel/bladder control, bathing, dressing, feeding, grooming and toileting 5. Can the patient actively participate in an intensive therapy program of at least 3 hrs of therapy per day at least 5 days per week? Yes and Potentially 6. The potential for patient to make measurable gains while on inpatient rehab is good 7. Anticipated functional outcomes upon discharge from inpatient rehab are min assist  with PT, min assist with OT, n/a with SLP. 8. Estimated rehab length of stay to reach the above functional goals is: 16-21 days 9. Does the patient have adequate social supports and living environment to accommodate these discharge functional goals? Potentially 10. Anticipated D/C setting: Home 11. Anticipated post D/C treatments: [place] therapy 12. Overall Rehab/Functional  Prognosis: good  RECOMMENDATIONS: This patient's condition is appropriate for continued rehabilitative care in the following setting: CIR Patient has agreed to participate in recommended program. Yes Note that insurance prior authorization may be required for reimbursement for recommended care.  Comment: Need to clarify how much caregiver Time is available post discharge    05/23/2015

## 2015-05-24 ENCOUNTER — Encounter (HOSPITAL_COMMUNITY): Payer: Self-pay | Admitting: Physical Medicine and Rehabilitation

## 2015-05-24 ENCOUNTER — Inpatient Hospital Stay (HOSPITAL_COMMUNITY)
Admission: RE | Admit: 2015-05-24 | Discharge: 2015-06-10 | DRG: 560 | Disposition: A | Discharge status: Skilled Nursing Facility | Payer: Medicare Other | Source: Intra-hospital | Attending: Physical Medicine & Rehabilitation | Admitting: Physical Medicine & Rehabilitation

## 2015-05-24 DIAGNOSIS — Z888 Allergy status to other drugs, medicaments and biological substances status: Secondary | ICD-10-CM

## 2015-05-24 DIAGNOSIS — K5900 Constipation, unspecified: Secondary | ICD-10-CM | POA: Diagnosis present

## 2015-05-24 DIAGNOSIS — S3210XD Unspecified fracture of sacrum, subsequent encounter for fracture with routine healing: Secondary | ICD-10-CM | POA: Diagnosis not present

## 2015-05-24 DIAGNOSIS — R159 Full incontinence of feces: Secondary | ICD-10-CM | POA: Diagnosis present

## 2015-05-24 DIAGNOSIS — R2681 Unsteadiness on feet: Secondary | ICD-10-CM | POA: Diagnosis not present

## 2015-05-24 DIAGNOSIS — N319 Neuromuscular dysfunction of bladder, unspecified: Secondary | ICD-10-CM | POA: Diagnosis not present

## 2015-05-24 DIAGNOSIS — N393 Stress incontinence (female) (male): Secondary | ICD-10-CM | POA: Diagnosis present

## 2015-05-24 DIAGNOSIS — R32 Unspecified urinary incontinence: Secondary | ICD-10-CM | POA: Diagnosis present

## 2015-05-24 DIAGNOSIS — F329 Major depressive disorder, single episode, unspecified: Secondary | ICD-10-CM | POA: Diagnosis not present

## 2015-05-24 DIAGNOSIS — R262 Difficulty in walking, not elsewhere classified: Secondary | ICD-10-CM | POA: Diagnosis present

## 2015-05-24 DIAGNOSIS — Z9181 History of falling: Secondary | ICD-10-CM | POA: Diagnosis not present

## 2015-05-24 DIAGNOSIS — H548 Legal blindness, as defined in USA: Secondary | ICD-10-CM | POA: Diagnosis present

## 2015-05-24 DIAGNOSIS — D8689 Sarcoidosis of other sites: Secondary | ICD-10-CM | POA: Diagnosis present

## 2015-05-24 DIAGNOSIS — Z79891 Long term (current) use of opiate analgesic: Secondary | ICD-10-CM

## 2015-05-24 DIAGNOSIS — N312 Flaccid neuropathic bladder, not elsewhere classified: Secondary | ICD-10-CM | POA: Diagnosis present

## 2015-05-24 DIAGNOSIS — R296 Repeated falls: Secondary | ICD-10-CM | POA: Diagnosis present

## 2015-05-24 DIAGNOSIS — S3210XA Unspecified fracture of sacrum, initial encounter for closed fracture: Secondary | ICD-10-CM | POA: Diagnosis present

## 2015-05-24 DIAGNOSIS — R279 Unspecified lack of coordination: Secondary | ICD-10-CM | POA: Diagnosis not present

## 2015-05-24 DIAGNOSIS — Z79899 Other long term (current) drug therapy: Secondary | ICD-10-CM

## 2015-05-24 DIAGNOSIS — R339 Retention of urine, unspecified: Secondary | ICD-10-CM | POA: Diagnosis not present

## 2015-05-24 DIAGNOSIS — Z0000 Encounter for general adult medical examination without abnormal findings: Secondary | ICD-10-CM

## 2015-05-24 DIAGNOSIS — S3422XD Injury of nerve root of sacral spine, subsequent encounter: Secondary | ICD-10-CM

## 2015-05-24 DIAGNOSIS — E46 Unspecified protein-calorie malnutrition: Secondary | ICD-10-CM | POA: Diagnosis not present

## 2015-05-24 DIAGNOSIS — S3422XS Injury of nerve root of sacral spine, sequela: Secondary | ICD-10-CM | POA: Diagnosis not present

## 2015-05-24 DIAGNOSIS — S3422XA Injury of nerve root of sacral spine, initial encounter: Secondary | ICD-10-CM | POA: Diagnosis present

## 2015-05-24 DIAGNOSIS — G609 Hereditary and idiopathic neuropathy, unspecified: Secondary | ICD-10-CM | POA: Diagnosis not present

## 2015-05-24 DIAGNOSIS — B9620 Unspecified Escherichia coli [E. coli] as the cause of diseases classified elsewhere: Secondary | ICD-10-CM | POA: Diagnosis present

## 2015-05-24 DIAGNOSIS — M6281 Muscle weakness (generalized): Secondary | ICD-10-CM | POA: Diagnosis not present

## 2015-05-24 DIAGNOSIS — N390 Urinary tract infection, site not specified: Secondary | ICD-10-CM | POA: Diagnosis present

## 2015-05-24 DIAGNOSIS — E039 Hypothyroidism, unspecified: Secondary | ICD-10-CM | POA: Diagnosis not present

## 2015-05-24 DIAGNOSIS — G629 Polyneuropathy, unspecified: Secondary | ICD-10-CM | POA: Diagnosis present

## 2015-05-24 DIAGNOSIS — K592 Neurogenic bowel, not elsewhere classified: Secondary | ICD-10-CM | POA: Diagnosis present

## 2015-05-24 DIAGNOSIS — D869 Sarcoidosis, unspecified: Secondary | ICD-10-CM | POA: Diagnosis present

## 2015-05-24 DIAGNOSIS — S3219XD Other fracture of sacrum, subsequent encounter for fracture with routine healing: Secondary | ICD-10-CM | POA: Diagnosis not present

## 2015-05-24 DIAGNOSIS — S3210XS Unspecified fracture of sacrum, sequela: Secondary | ICD-10-CM | POA: Diagnosis not present

## 2015-05-24 DIAGNOSIS — Z880 Allergy status to penicillin: Secondary | ICD-10-CM | POA: Diagnosis not present

## 2015-05-24 DIAGNOSIS — S329XXA Fracture of unspecified parts of lumbosacral spine and pelvis, initial encounter for closed fracture: Secondary | ICD-10-CM | POA: Diagnosis not present

## 2015-05-24 DIAGNOSIS — Z4789 Encounter for other orthopedic aftercare: Secondary | ICD-10-CM | POA: Diagnosis not present

## 2015-05-24 DIAGNOSIS — R918 Other nonspecific abnormal finding of lung field: Secondary | ICD-10-CM | POA: Diagnosis not present

## 2015-05-24 MED ORDER — ONDANSETRON HCL 4 MG/2ML IJ SOLN: 4.0000 mg | Freq: Four times a day (QID) | INTRAMUSCULAR | Status: DC | PRN

## 2015-05-24 MED ORDER — LEVOTHYROXINE SODIUM 100 MCG PO TABS
100.0000 ug | ORAL_TABLET | Freq: Every day | ORAL | Status: DC
  Administered 2015-05-25 – 2015-06-10 (×17): 100 ug via ORAL
  Filled 2015-05-24 (×19): qty 1

## 2015-05-24 MED ORDER — POLYETHYLENE GLYCOL 3350 17 G PO PACK
17.0000 g | PACK | Freq: Two times a day (BID) | ORAL | Status: DC
  Administered 2015-05-24 – 2015-06-02 (×18): 17 g via ORAL
  Filled 2015-05-24 (×22): qty 1

## 2015-05-24 MED ORDER — TAMSULOSIN HCL 0.4 MG PO CAPS
0.4000 mg | ORAL_CAPSULE | Freq: Every day | ORAL | Status: DC
  Administered 2015-05-24 – 2015-06-05 (×13): 0.4 mg via ORAL
  Filled 2015-05-24 (×14): qty 1

## 2015-05-24 MED ORDER — BISACODYL 10 MG RE SUPP
10.0000 mg | Freq: Every day | RECTAL | Status: DC
  Administered 2015-05-25 – 2015-06-10 (×16): 10 mg via RECTAL
  Filled 2015-05-24 (×17): qty 1

## 2015-05-24 MED ORDER — POLYETHYLENE GLYCOL 3350 17 GM/SCOOP PO POWD: 17.0000 g | Freq: Every day | ORAL | Status: DC

## 2015-05-24 MED ORDER — FLEET ENEMA 7-19 GM/118ML RE ENEM: 1.0000 | ENEMA | Freq: Once | RECTAL | Status: AC | PRN

## 2015-05-24 MED ORDER — CEFPODOXIME PROXETIL 200 MG PO TABS
200.0000 mg | ORAL_TABLET | Freq: Two times a day (BID) | ORAL | Status: DC
  Administered 2015-05-24: 200 mg via ORAL
  Filled 2015-05-24 (×2): qty 1

## 2015-05-24 MED ORDER — LIDOCAINE 5 % EX PTCH
1.0000 | MEDICATED_PATCH | CUTANEOUS | Status: DC
  Administered 2015-05-24 – 2015-06-09 (×17): 1 via TRANSDERMAL
  Filled 2015-05-24 (×18): qty 1

## 2015-05-24 MED ORDER — ONDANSETRON HCL 4 MG PO TABS: 4.0000 mg | ORAL_TABLET | Freq: Four times a day (QID) | ORAL | Status: DC | PRN

## 2015-05-24 MED ORDER — METHOCARBAMOL 500 MG PO TABS
500.0000 mg | ORAL_TABLET | Freq: Four times a day (QID) | ORAL | Status: DC | PRN
  Administered 2015-05-25 – 2015-06-06 (×5): 500 mg via ORAL
  Filled 2015-05-24 (×5): qty 1

## 2015-05-24 MED ORDER — HYDROCODONE-ACETAMINOPHEN 5-325 MG PO TABS: 1.0000 | ORAL_TABLET | Freq: Four times a day (QID) | ORAL | Status: DC | PRN

## 2015-05-24 MED ORDER — ACETAMINOPHEN 325 MG PO TABS
325.0000 mg | ORAL_TABLET | ORAL | Status: DC | PRN
  Filled 2015-05-24 (×2): qty 2

## 2015-05-24 MED ORDER — OXYCODONE HCL 5 MG PO TABS
5.0000 mg | ORAL_TABLET | ORAL | Status: DC | PRN
Start: 2015-05-24 — End: 2015-05-25
  Administered 2015-05-24 – 2015-05-25 (×3): 5 mg via ORAL
  Filled 2015-05-24 (×3): qty 1

## 2015-05-24 MED ORDER — ALUM & MAG HYDROXIDE-SIMETH 200-200-20 MG/5ML PO SUSP: 30.0000 mL | ORAL | Status: DC | PRN

## 2015-05-24 MED ORDER — ENOXAPARIN SODIUM 40 MG/0.4ML ~~LOC~~ SOLN
40.0000 mg | SUBCUTANEOUS | Status: DC
  Administered 2015-05-24 – 2015-06-09 (×17): 40 mg via SUBCUTANEOUS
  Filled 2015-05-24 (×18): qty 0.4

## 2015-05-24 MED ORDER — TAMSULOSIN HCL 0.4 MG PO CAPS: 0.4000 mg | ORAL_CAPSULE | Freq: Every day | ORAL | Status: DC

## 2015-05-24 MED ORDER — HYDROCODONE-ACETAMINOPHEN 5-325 MG PO TABS
1.0000 | ORAL_TABLET | Freq: Four times a day (QID) | ORAL | Status: DC | PRN
  Filled 2015-05-24: qty 1

## 2015-05-24 MED ORDER — TRAMADOL HCL 50 MG PO TABS: 50.0000 mg | ORAL_TABLET | Freq: Four times a day (QID) | ORAL | Status: DC | PRN

## 2015-05-24 MED ORDER — CEFPODOXIME PROXETIL 200 MG PO TABS
200.0000 mg | ORAL_TABLET | Freq: Two times a day (BID) | ORAL | Status: DC
  Administered 2015-05-24 – 2015-05-30 (×12): 200 mg via ORAL
  Filled 2015-05-24 (×14): qty 1

## 2015-05-24 MED ORDER — FLUOXETINE HCL 20 MG PO CAPS
40.0000 mg | ORAL_CAPSULE | Freq: Every day | ORAL | Status: DC
  Administered 2015-05-25 – 2015-06-10 (×17): 40 mg via ORAL
  Filled 2015-05-24 (×19): qty 2

## 2015-05-24 MED ORDER — GUAIFENESIN-DM 100-10 MG/5ML PO SYRP: 5.0000 mL | ORAL_SOLUTION | Freq: Four times a day (QID) | ORAL | Status: DC | PRN

## 2015-05-24 NOTE — H&P (Signed)
Physical Medicine and Rehabilitation Admission H&P   Chief Complaint  Patient presents with  . Sacral fracture in patient with Neurosarcoidosis/legally blind    HPI: [name] is a 59 year old female with h/o Neurosarcoidosis with diffuse peripheral neuropathy, progressive gait disorder and significant visual impairment; who sustained a fall on 05/09/15 during process of moving to [place]. Since that episode she has had multiple falls with bladder and bowel incontinence and difficulty walking. She was evaluated in ED on 5/26 with negative work up. She was re-admitted on 05/20/15 recurrent fall, HA and sacral pain. CT head revealed advanced centralized atrophy with ex vacuo dilation of ventricular system and was negative for acute changes. MRI Lumbar spine showed sacral fracture S1-S2 with mild angulation and some edema and hemorrhage in the area. No surgical intervention needed per Dr. Kathyrn Sheriff. Thoracic spine MRI demonstrated a small T10 superior endplate compression which appeared acute. Cervical spine MRI showed no compressive lesions no evidence of fractures. Dr. Lorin Mercy consulted and advised conservative care with TLSO for support. Neurology consulted for instability of gait with workup felt to be suspect nonfocal concussion like trauma to the spinal cord as no compressive lesions noted. She has had issues with neuropathy BLE as well as urinary retention requiring foley placement. She was started on rocephin for E coli UTI. She failed voiding trial on 06/06 therefore foley was replaced. She has had improvement in BLE strength but continue to be limited by significant sacral pain   Review of Systems  HENT: Negative for hearing loss.  Eyes:   Able to see outline and some colors  Respiratory: Negative for cough and shortness of breath.  Cardiovascular: Negative for chest pain and palpitations.  Gastrointestinal: Positive for heartburn. Negative for constipation.   Musculoskeletal: Positive for myalgias, back pain, joint pain and falls (in the past week).  Neurological: Positive for tingling, sensory change and focal weakness. Negative for dizziness and headaches.  Endo/Heme/Allergies: Negative.  Psychiatric/Behavioral: Positive for memory loss.      Past Medical History  Diagnosis Date  . 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 Progressive gait disorder     Was treated for probable MS until diagnosed with Neurosarcoidosis about 10 years ago.     History reviewed. No pertinent past surgical history. Family History  Problem Relation Age of Onset  . Hypertension Father      Social History: Lives alone. Parents make meals for a week at a time. An aide comes in 3 hours a day to help with housework an set up meals. Disabled Restaurant manager, fast food. She reports that she has never smoked. She does not have any smokeless tobacco history on file. She reports that she does not drink alcohol or use illicit drugs.     Allergies  Allergen Reactions  . Remicade [Infliximab]     shingles  . Penicillins Rash    Medications Prior to Admission  Medication Sig Dispense Refill  . FLUoxetine (PROZAC) 40 MG capsule Take 40 mg by mouth daily.    . furosemide (LASIX) 40 MG tablet Take 40 mg by mouth daily as needed for fluid or edema.     Marland Kitchen HYDROcodone-acetaminophen (NORCO/VICODIN) 5-325 MG per tablet Take 1 tablet by mouth every 6 (six) hours as needed for moderate pain. 30 tablet 0  . levothyroxine (SYNTHROID, LEVOTHROID) 100 MCG tablet Take 100 mcg by mouth  daily.    . polyethylene glycol powder (MIRALAX) powder Take 17 g by mouth daily. 255 g 0  . potassium chloride (K-DUR) 10 MEQ tablet Take 10 mEq by mouth daily.    . tamsulosin (FLOMAX) 0.4 MG CAPS capsule Take 1  capsule (0.4 mg total) by mouth daily. 30 capsule     Home: Home Living Family/patient expects to be discharged to:: Private residence Living Arrangements: Alone (per neuro note-pt had an "aide" ) Type of Home: Apartment Home Access: Level entry Home Layout: One level Home Equipment: Bedside commode, Grab bars - tub/shower, Walker - 2 wheels, Shower seat, Cane - single point Additional Comments: pt's elderly but very active and independent parents present for eval and answered most questions, gave hx; They live in [place] but are planning a move to [place] in the fall of this year;  Functional History: Prior Function Level of Independence: Independent with assistive device(s) Comments: per parent's report  Functional Status:  Mobility: Bed Mobility Overal bed mobility: Needs Assistance Bed Mobility: Sidelying to Sit, Rolling Rolling: Max assist Sidelying to sit: Total assist General bed mobility comments: assisted with rolling to R side then sidelying to sit EOB with use of rail and 75% VC's on proper tech.  Transfers Overall transfer level: Needs assistance Equipment used: Rolling walker (2 wheeled) Transfers: Sit to/from Stand Sit to Stand: Mod assist General transfer comment: 50% VC's on proper hand placement and increased time due to pain Ambulation/Gait Ambulation/Gait assistance: Mod assist Ambulation Distance (Feet): 10 Feet Assistive device: Rolling walker (2 wheeled) General Gait Details: very short steps with increased time. assist advancing walker due to decreased vision. Amb from elevated bed to doorway, then turned around to amb back to bed. Very limited activity tolerance due to pain.     ADL: ADL Overall ADL's : Needs assistance/impaired Eating/Feeding: Total assistance Grooming: Maximal assistance Upper Body Dressing : Total assistance, Sitting Lower Body Dressing: Total assistance, Bed level Functional mobility during ADLs: Total  assistance, +2 for physical assistance, +2 for safety/equipment (bed mobility, supine to sit only) General ADL Comments: Patient is blind. Has had to have someone feed her in the hospital. Basically total care at this time. She reports, and her parents confirm, that she was independent and living alone previously. Worked on bed mobility. Max A +2 roll to right, total A +2 supine to sit. Sat EOB total A initially, with brief period of min guard A. Total A +2 sit to supine. Total A +2 roll to left. Dependent to change gowns and change bed pad. Positioned comfortably in bed at end of session. Pain limits her.   Cognition: Cognition Overall Cognitive Status: Within Functional Limits for tasks assessed Cognition Arousal/Alertness: Awake/alert Behavior During Therapy: WFL for tasks assessed/performed Overall Cognitive Status: Within Functional Limits for tasks assessed Area of Impairment: Following commands, Problem solving Following Commands: Follows one step commands with increased time Problem Solving: Slow processing, Decreased initiation, Difficulty sequencing, Requires verbal cues, Requires tactile cues General Comments: appeared at base   Blood pressure 93/45, pulse 83, temperature 98.9 F (37.2 C), temperature source Oral, resp. rate 16, height 5' 4.5" (1.638 m), weight 54.432 kg (120 lb), SpO2 100 %. Physical Exam  Nursing note and vitals reviewed. Constitutional: She is oriented to person, place, and time. She appears well-developed and well-nourished.  HENT:  Head: Normocephalic and atraumatic.  Eyes: Conjunctivae are normal. Pupils are equal, round, and reactive to light.  Neck: Normal range of motion. Neck supple.  Cardiovascular: Normal  rate and regular rhythm.  Respiratory: Effort normal and breath sounds normal. No respiratory distress. She has no wheezes.  GI: Soft. Bowel sounds are normal. She exhibits no distension. There is no tenderness.  Neurological: She is alert and  oriented to person, place, and time.  Speech clear. Keeps eyes closed. Able to follow basic motor commands with cues. Noted to have problems with memory. Lacks insight and awareness of deficits.   Reduced sensation on the plantar surface of both feet intact sensation on the dorsal surface Motor strength is 4+ bilateral deltoid, biceps, triceps, grip. 3 minus bilateral hip flexor and knee extensor 4 at the ankle dorsiflexor and plantar flexor.  Tenderness to palpation along the lower thoracic paraspinal and spinous process area as well as along the sacrum Skin: Skin is warm and dry.  Psychiatric: Her speech is normal. Her affect is blunt. Cognition and memory are impaired.     Lab Results Last 48 Hours    No results found for this or any previous visit (from the past 48 hour(s)).    Imaging Results (Last 48 hours)    No results found.       Medical Problem List and Plan: 1. 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  2. DVT Prophylaxis/Anticoagulation: Pharmaceutical: Lovenox 3. Pain Management: Will resume Oxycodone as hydrocodone ineffective. Will add lidocaine patch also.  4. Mood: Continue prozac.  5. Neuropsych: This patient is capable of making decisions on her own behalf. 6. Skin/Wound Care: Routine pressure relief measure. Will order air mattress overlay to help with pressure relief as well as pain.  7. Fluids/Electrolytes/Nutrition: Monitor I/O. Check lytes in am.  8. Urinary retention: Continue foley till pain and mobility improves then attempt another voiding trial. Continue flomax.  9. E. Coli UTI: Antibiotic D#4/7.Rocephin changed to Vantin today. 10. Constipation: continue Miralax bid with suppository daily. 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      Post Admission Physician Evaluation: Functional deficits secondary to Sacral fracture with sacral nerve root injury causing neurogenic  bowel and bladder. . 1. Patient is admitted to receive collaborative, interdisciplinary care between the physiatrist, rehab nursing staff, and therapy team. 2. Patient's level of medical complexity and substantial therapy needs in context of that medical necessity cannot be provided at a lesser intensity of care such as a SNF. 3. Patient has experienced substantial functional loss from his/her baseline which was documented above under the "Functional History" and "Functional Status" headings. Judging by the patient's diagnosis, physical exam, and functional history, the patient has potential for functional progress which will result in measurable gains while on inpatient rehab. These gains will be of substantial and practical use upon discharge in facilitating mobility and self-care at the household level. 4. Physiatrist will provide 24 hour management of medical needs as well as oversight of the therapy plan/treatment and provide guidance as appropriate regarding the interaction of the two. 5. 24 hour rehab nursing will assist with bladder management, bowel management, safety, skin/wound care, disease management, medication administration, pain management and patient education and help integrate therapy concepts, techniques,education, etc. 6. PT will assess and treat for/with: pre gait, gait training, endurance , safety, equipment, neuromuscular re education. Goals are: Sup. 7. OT will assess and treat for/with: ADLs, Cognitive perceptual skills, Neuromuscular re education, safety, endurance, equipment. Goals are: Supervision. Therapy May proceed with showering this patient. 8. SLP will assess and treat for/with: NA. Goals are: NA. 9. Case Management and Social  Worker will assess and treat for psychological issues and discharge planning. 10. Team conference will be held weekly to assess progress toward goals and to determine barriers to discharge. 11. Patient will receive at least 3 hours of  therapy per day at least 5 days per week. 12. ELOS: 16-21d  13. Prognosis: good     Charlett Blake M.D. [facility] FAAPM&R (Sports Med, Neuromuscular Med) Diplomate Am Board of Electrodiagnostic Med  05/24/2015

## 2015-05-24 NOTE — Progress Notes (Signed)
Jamie [name], EMT transferred patient via [facility] to [facility].   Jeannie with CIR approved transfer.   Patients RN Judson Roch, called report to receiving RN, Neoma Laming on 4W at [facility]. All questions answered. Patient being transported to 4W03.

## 2015-05-24 NOTE — Progress Notes (Signed)
Rehab admissions - I have secured a rehab bed and will admit patient to inpatient rehab today.  Call me for questions.  #[phone]

## 2015-05-24 NOTE — Progress Notes (Signed)
Rehab admissions - Evaluated for possible admission.  I met with patient and I spoke to dad by phone.  Dad would like inpatient rehab admission.  However, beds are limited today.  If a bed becomes available later today, I will contact social worker to let her know.  Call me for questions.  #[phone]

## 2015-05-24 NOTE — Discharge Summary (Addendum)
Jamie [name], is a 59 y.o. female  DOB 12/24/1955  MRN [id number].  Admission date:  05/20/2015  Admitting Physician  Toy Baker, MD  Discharge Date:  05/24/2015   Primary MD  Olga Millers, MD  Recommendations for primary care physician for things to follow:   Needs outpt    Admission Diagnosis  Urinary retention [R33.9] UTI (lower urinary tract infection) [N39.0] Leg weakness [R29.898] Sacral fracture, closed, initial encounter [S32.10XA]   Discharge Diagnosis  Urinary retention [R33.9] UTI (lower urinary tract infection) [N39.0] Leg weakness [R29.898] Sacral fracture, closed, initial encounter [S32.10XA]    Active Problems:   Sacral fracture, closed   Neurosarcoidosis   UTI (lower urinary tract infection)   Urinary retention   Leg weakness      Past Medical History  Diagnosis Date  . 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 Sarcoidosis     History reviewed. No pertinent past surgical history.     History of present illness and  Hospital Course:     Kindly see H&P for history of present illness and admission details, please review complete Labs, Consult reports and Test reports for all details in brief  HPI  from the history and physical done on the day of admission  Jamie [name] is a 59 y.o. 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   Patient originally from [facility] she moved here 1 week ago she has hx of Neurosarcoidosis.  Patient has trouble with balance for many years. She has had frequent falls. Following a bad fall on her buttocks on 5/23 she developed trouble with voiding when she stands up. She also noted some numbness of her lower extremities. She felt that her buttocks felt numb.  She reports burning pain radiating down her  legs. Patient was in a process of moving to [place]. For the past week she needed help with daily ADL's. Her elderly parents were trying to take care of the patient but were unable to do so. She presented to ER.   MRI lumbar spine was done showing Fracture of the sacrum with some retropulsion. MRI of the cervical spine showed C4-5 buldge with mild spinal stenosis and minimal cord contact. NS has been consulted. ER discussed case with Dr. Diamantina Monks who felt there was no indication for operative intervention. Dr. Lorin Mercy with [facility] was also consulted and will see patient in AM.  She was noted to have evidence of urinary retention and foley catheter was placed. UA worrisome for UTI,  Hospitalist was called for admission for UTI, sacral fracture    Hospital Course    1. 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 Now recurrent falls with evidence of sacral fracture and L4-L5 disc G generation along with questionable hemorrhage/edema.  Neurosurgeon Dr. Kathyrn Sheriff has been consulted by ER he reviewed the MRI images and thought there is no surgical process. Orthopedic surgeon Dr. Lorin Mercy has also been consulted in the ER and he has nothing to offer as  well. I have consulted neurology who evaluated all the scans and recommend medical management as being done.   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 Wear TLSO brace when out of bed, continue supportive care with pain control. PT evaluation done, CIR vs SNF today.    2. Urinary retention. Due to #1 above. Had Foley with Flomax. She failed a trial of removing Foley catheter on 05/23/2015,have replaced Foley + Flomax will require outpatient urology follow-up.   3. Depression. Continue Prozac.   4. Hypothyroidism on Synthroid and TSH stable.   5. UTI. Treated with IV Rocephin. This was prior to Foley placement.   6.  Sarcoidosis with blindness and sarcoid-related neuropathy. Supportive care    Discharge Condition: Stable  Follow UP  Follow-up Information    Follow up with Olga Millers, MD. Schedule an appointment as soon as possible for a visit in 1 week.   Specialty:  Internal Medicine   Contact information:   [place] [id number] [phone]       Follow up with [facility], MATTHEW, MD. Schedule an appointment as soon as possible for a visit in 1 week.   Specialty:  Urology   Why:  Bladder outlet obstruction   Contact information:   [place] [address] [phone]       Follow up with [facility]. Schedule an appointment as soon as possible for a visit in 1 week.   Why:  Diffuse weakness due to neurosarcoidosis   Contact information:   [address] [place]  [id number] [phone]       Consults obtained -  Neurology consulted,   Orthopedics Dr Lorin Mercy consulted by EDP -  nothing to offer    Neurosurgery and Dr. Kathyrn Sheriff called by ER physician no surgical need per him.    Diet and Activity recommendation: See Discharge Instructions below  Discharge Instructions           Discharge Instructions    Diet - low sodium heart healthy    Complete by:  As directed      Discharge instructions    Complete by:  As directed   Follow with Primary MD Olga Millers, MD and recommended Urologist in 7 days   Get CBC, CMP, 2 view Chest X ray checked  by Primary MD next visit.    Activity: As tolerated with Full fall precautions use walker/cane & assistance as needed   Disposition SNF   Diet: Heart Healthy  with feeding assistance and aspiration precautions.  For Heart failure patients - Check your Weight same time everyday, if you gain over 2 pounds, or you develop in leg swelling, experience more shortness of breath or chest pain, call your Primary MD immediately. Follow Cardiac Low Salt  Diet and 1.5 lit/day fluid restriction.   On your next visit with your primary care physician please Get Medicines reviewed and adjusted.   Please request your Prim.MD to go over all Hospital Tests and Procedure/Radiological results at the follow up, please get all Hospital records sent to your Prim MD by signing hospital release before you go home.   If you experience worsening of your admission symptoms, develop shortness of breath, life threatening emergency, suicidal or homicidal thoughts you must seek medical attention immediately by calling 911 or calling your MD immediately  if symptoms less severe.  You Must read complete  instructions/literature along with all the possible adverse reactions/side effects for all the Medicines you take and that have been prescribed to you. Take any new Medicines after you have completely understood and accpet all the possible adverse reactions/side effects.   Do not drive, operating heavy machinery, perform activities at heights, swimming or participation in water activities or provide baby sitting services if your were admitted for syncope or siezures until you have seen by Primary MD or a Neurologist and advised to do so again.  Do not drive when taking Pain medications.    Do not take more than prescribed Pain, Sleep and Anxiety Medications  Special Instructions: If you have smoked or chewed Tobacco  in the last 2 yrs please stop smoking, stop any regular Alcohol  and or any Recreational drug use.  Wear Seat belts while driving.   Please note  You were cared for by a hospitalist during your hospital stay. If you have any questions about your discharge medications or the care you received while you were in the hospital after you are discharged, you can call the unit and asked to speak with the hospitalist on call if the hospitalist that took care of you is not available. Once you are discharged, your primary care physician will handle any further  medical issues. Please note that NO REFILLS for any discharge medications will be authorized once you are discharged, as it is imperative that you return to your primary care physician (or establish a relationship with a primary care physician if you do not have one) for your aftercare needs so that they can reassess your need for medications and monitor your lab values.     Increase activity slowly    Complete by:  As directed            Discharge Medications     Medication List    TAKE these medications        FLUoxetine 40 MG capsule  Commonly known as:  PROZAC  Take 40 mg by mouth daily.     furosemide 40 MG tablet  Commonly known as:  LASIX  Take 40 mg by mouth daily as needed for fluid or edema.     HYDROcodone-acetaminophen 5-325 MG per tablet  Commonly known as:  NORCO/VICODIN  Take 1 tablet by mouth every 6 (six) hours as needed for moderate pain.     levothyroxine 100 MCG tablet  Commonly known as:  SYNTHROID, LEVOTHROID  Take 100 mcg by mouth daily.     polyethylene glycol powder powder  Commonly known as:  MIRALAX  Take 17 g by mouth daily.     potassium chloride 10 MEQ tablet  Commonly known as:  K-DUR  Take 10 mEq by mouth daily.     tamsulosin 0.4 MG Caps capsule  Commonly known as:  FLOMAX  Take 1 capsule (0.4 mg total) by mouth daily.        Major procedures and Radiology Reports - PLEASE review detailed and final reports for all details, in brief -       Dg Lumbar Spine Complete  05/12/2015   CLINICAL DATA:  Low back pain following recent falls, initial encounter  EXAM: LUMBAR SPINE - COMPLETE 4+ VIEW  COMPARISON:  None.  FINDINGS: There is no evidence of lumbar spine fracture. Alignment is normal. Intervertebral disc spaces are maintained.  IMPRESSION: No acute abnormality noted.   Electronically Signed   By: Inez Catalina M.D.   On: 05/12/2015 09:43  Dg Pelvis 1-2 Views  05/12/2015   CLINICAL DATA:  Pain following fall  EXAM: PELVIS - 1-2  VIEW  COMPARISON:  None.  FINDINGS: There is no evidence of fracture or dislocation. Joint spaces appear intact. No erosive change.  IMPRESSION: No fracture or dislocation.  No appreciable arthropathy.   Electronically Signed   By: Lowella Grip III M.D.   On: 05/12/2015 09:43   Ct Head Wo Contrast  05/20/2015   CLINICAL DATA:  Several falls during the past 2 weeks. No loss of consciousness. Generalized weakness.  EXAM: CT HEAD WITHOUT CONTRAST  TECHNIQUE: Contiguous axial images were obtained from the base of the skull through the vertex without intravenous contrast.  COMPARISON:  05/12/2015  FINDINGS: Similar findings of age advanced centralized atrophy with ex vacuo dilatation of the ventricular system, most conspicuously involving the fourth ventricle. Age advanced cerebellar volume loss with sulcal prominence. Scattered periventricular hypodensities compatible microvascular ischemic disease. Given background parenchymal abnormalities, there is no CT evidence of superimposed acute large territory infarct. No definite intraparenchymal or extra-axial mass or hemorrhage. Unchanged size and configuration of the ventricles and basilar cisterns. No midline shift. There is underpneumatization the bilateral frontal sinuses. Polypoid mucosal thickening of the right maxillary sinus. The remaining paranasal sinuses and mastoid air cells are normally aerated. No air-fluid levels. Regional soft tissues appear normal. No displaced calvarial fracture.  IMPRESSION: 1. Microvascular ischemic disease without definite acute intracranial process. 2. Similar findings of age advanced cerebellar and centralized atrophy with commensurate ex vacuo dilatation of the ventricular system most conspicuously involving the fourth ventricle. Further evaluation could be performed with brain MRI as clinically indicated.   Electronically Signed   By: Sandi Mariscal M.D.   On: 05/20/2015 16:53   Ct Head Wo Contrast  05/12/2015   CLINICAL  DATA:  Multiple falls  EXAM: CT HEAD WITHOUT CONTRAST  TECHNIQUE: Contiguous axial images were obtained from the base of the skull through the vertex without intravenous contrast.  COMPARISON:  None.  FINDINGS: The bony calvarium is intact. No gross soft tissue abnormality is noted. Lateral ventricles are prominent without significant cortical atrophy. Few scattered areas of decreased attenuation consistent chronic white matter ischemic change are noted. Prominence of the fourth ventricle is noted as well. No space-occupying mass lesion or acute hemorrhage is noted.  IMPRESSION: Chronic ischemic changes.  Prominence of the lateral ventricles and fourth ventricle. This may be due to a component of normal pressure hydrocephalus.   Electronically Signed   By: Inez Catalina M.D.   On: 05/12/2015 10:05   Mr Cervical Spine Wo Contrast  05/20/2015   CLINICAL DATA:  59 year old female with weakness and falling for the past 14 days. History of neurosarcoidosis. Numbness buttock region. Symptoms worsening over the past 3 days. Subsequent encounter.  EXAM: MRI CERVICAL SPINE WITHOUT CONTRAST  TECHNIQUE: Multiplanar, multisequence MR imaging of the cervical spine was performed. No intravenous contrast was administered.  COMPARISON:  05/20/2015 and 05/12/2015 head CT.  FINDINGS: Exam is motion degraded.  Abnormal appearance of the cerebellum with marked atrophy. Possibility of primary neurodegenerative disorder of the cerebellum is raised. Similar type findings can be seen in this setting of seizures, treatment of seizures, malnutrition or excess alcohol use. On CT, patient is also noted to have prominent lateral ventricles. Patient may benefit from contrast-enhanced MR for further delineation (would help evaluate for intracranial neurosarcoidosis changes).  Curvature of the cervical spine. No obvious osseous edema or soft tissue edema to suggest bony injury  or soft tissue injury. If bony injury is of high clinical concern, CT  may then be considered for further delineation.  No focal cord signal abnormality.  Evaluation is otherwise limited secondary to the motion.  C2-3:  Negative.  C3-4:  Negative.  C4-5:  Bulge with mild spinal stenosis and minimal cord contact.  C5-6: Minimal to mild bulge. Minimal narrowing ventral aspect of the thecal sac.  C6-7:  Minimal bulge.  C7-T1:  Negative.  T1-2:  Negative.  T2-3:  Negative.  T3-4: Small Schmorl's node deformity superior endplate T4.  L2-4:  Limited imaging without obvious abnormality.  IMPRESSION: Exam is motion degraded.  Abnormal appearance of the cerebellum with marked atrophy. Possibility of primary neurodegenerative disorder of the cerebellum is raised. Similar type findings can be seen in this setting of seizures, treatment of seizures, malnutrition or excess alcohol use. On CT, patient is also noted to have prominent lateral ventricles. Patient may benefit from contrast-enhanced MR for further delineation (would help evaluate for intracranial neurosarcoidosis changes).  Curvature of the cervical spine. No obvious osseous edema or soft tissue edema to suggest bony injury or soft tissue injury. If bony injury is of high clinical concern, CT may then be considered for further delineation.  No focal cord signal abnormality.  Evaluation is otherwise limited secondary to the motion.  C4-5 bulge with mild spinal stenosis and minimal cord contact.   Electronically Signed   By: Genia Del M.D.   On: 05/20/2015 21:33   Mr Lumbar Spine Wo Contrast  05/20/2015   CLINICAL DATA:  59 year old female with history of neuro sarcoidosis. Recent falls. Numbness buttock region. Subsequent encounter.  EXAM: MRI LUMBAR SPINE WITHOUT CONTRAST  TECHNIQUE: Multiplanar, multisequence MR imaging of the lumbar spine was performed. No intravenous contrast was administered.  COMPARISON:  05/12/2015 plain film exam.  FINDINGS: Last fully open disk space is labeled L5-S1. Present examination incorporates from  T11-12 disc space through lower sacrum.  Exam is motion degraded.  Conus L1-2 level.  Fracture of the sacrum. This is centered at the S1-2 level with mild retropulsion and angulation at this level contributing to narrowing of the canal and contained nerve roots. Surrounding edema/hemorrhage.  T11-12 through L3-4 unremarkable.  L4-5: Mild disc degeneration. Bulge with small central disc protrusion. Minimal caudal extension. Slight impression ventral aspect of the thecal sac. Mild facet joint degenerative changes.  L5-S1:  Negative.  The bladder is full which may be related to patient not urinating as no compression of the conus is noted although other causes for urinary retention are not excluded.  Multiple gallstones.  IMPRESSION: Exam is motion degraded.  Fracture of the sacrum. This is centered at the S1-2 level with mild retropulsion and angulation at this level contributing to narrowing of the canal and contained nerve roots. Surrounding edema/hemorrhage.  L4-5 mild disc degeneration. Bulge with small central disc protrusion. Minimal caudal extension. Slight impression ventral aspect of the thecal sac. Mild facet joint degenerative changes.  The bladder is full which may be related to patient not urinating as no compression of the conus is noted although other causes for urinary retention are not excluded.  Multiple gallstones.   Electronically Signed   By: Genia Del M.D.   On: 05/20/2015 22:03   Mr Thoracic Spine W Wo Contrast  05/21/2015   CLINICAL DATA:  [place].  Back pain.  EXAM: MRI THORACIC SPINE WITHOUT AND WITH CONTRAST  TECHNIQUE: Multiplanar and multiecho pulse sequences of the thoracic spine were obtained  without and with intravenous contrast.  CONTRAST:  61mL MULTIHANCE GADOBENATE DIMEGLUMINE 529 MG/ML IV SOLN  COMPARISON:  None.  FINDINGS: Normal alignment of the thoracic vertebral bodies. There is a mild superior endplate fracture of the T10 vertebral body. No retropulsion or canal compromise.  The other thoracic vertebral bodies are normal. The thoracic spinal cord is normal. No cord lesions or syrinx. The facets are normally aligned. No foraminal lesions. No thoracic disc protrusions or canal stenosis.  IMPRESSION: Mild superior endplate fracture of I71 without retropulsion or canal compromise.   Electronically Signed   By: Marijo Sanes M.D.   On: 05/21/2015 13:49    Micro Results      Recent Results (from the past 240 hour(s))  Urine culture     Status: None   Collection Time: 05/20/15 11:15 PM  Result Value Ref Range Status   Specimen Description URINE, CATHETERIZED  Final   Special Requests Normal  Final   Colony Count   Final    >=100,000 COLONIES/ML Performed at [place]   Final    ESCHERICHIA COLI Performed at [facility]    Report Status 05/23/2015 FINAL  Final   Organism ID, Bacteria ESCHERICHIA COLI  Final      Susceptibility   Escherichia coli - MIC*    AMPICILLIN >=32 RESISTANT Resistant     CEFAZOLIN <=4 SENSITIVE Sensitive     CEFTRIAXONE <=1 SENSITIVE Sensitive     CIPROFLOXACIN <=0.25 SENSITIVE Sensitive     GENTAMICIN <=1 SENSITIVE Sensitive     LEVOFLOXACIN <=0.12 SENSITIVE Sensitive     NITROFURANTOIN <=16 SENSITIVE Sensitive     TOBRAMYCIN <=1 SENSITIVE Sensitive     TRIMETH/SULFA >=320 RESISTANT Resistant     PIP/TAZO <=4 SENSITIVE Sensitive     * ESCHERICHIA COLI       Today   Subjective:   Cuma Polyakov today has no headache,no chest abdominal pain,no new weakness tingling or numbness, feels much better.  Objective:   Blood pressure 107/56, pulse 86, temperature 98.6 F (37 C), temperature source Oral, resp. rate 16, height 5' 4.5" (1.638 m), weight 54.432 kg (120 lb), SpO2 100 %.   Intake/Output Summary (Last 24 hours) at 05/24/15 0930 Last data filed at 05/24/15 0742  Gross per 24 hour  Intake   1260 ml  Output    250 ml  Net   1010 ml    Exam Awake Alert, Oriented x 3, No new F.N deficits,  strength 4/5 all 4 extremities, legally blind, Normal affect Micro.AT,PERRAL Supple Neck,No JVD, No cervical lymphadenopathy appriciated.  Symmetrical Chest wall movement, Good air movement bilaterally, CTAB RRR,No Gallops,Rubs or new Murmurs, No Parasternal Heave +ve B.Sounds, Abd Soft, Non tender, No organomegaly appriciated, No rebound -guarding or rigidity. No Cyanosis, Clubbing or edema, No new Rash or bruise  Data Review   CBC w Diff:  Lab Results  Component Value Date   WBC 7.8 05/21/2015   HGB 9.9* 05/21/2015   HCT 29.0* 05/21/2015   PLT 342 05/21/2015   LYMPHOPCT 27 05/20/2015   MONOPCT 6 05/20/2015   EOSPCT 7* 05/20/2015   BASOPCT 1 05/20/2015    CMP:  Lab Results  Component Value Date   NA 141 05/21/2015   K 3.6 05/21/2015   CL 107 05/21/2015   CO2 25 05/21/2015   BUN 17 05/21/2015   CREATININE 0.48 05/21/2015   PROT 6.0* 05/21/2015   ALBUMIN 2.9* 05/21/2015   BILITOT 0.2* 05/21/2015  ALKPHOS 134* 05/21/2015   AST 22 05/21/2015   ALT 22 05/21/2015  .   Total Time in preparing paper work, data evaluation and todays exam - 35 minutes  Thurnell Lose M.D on 05/24/2015 at 9:30 AM  [facility]   Office  [phone]

## 2015-05-24 NOTE — Progress Notes (Signed)
Physical Therapy Treatment Patient Details Name: Jamie [name] MRN: [id number] DOB: 1956/07/04 Today's Date: 05/24/2015    History of Present Illness Jamie [name] is a 59 y.o. female with history of neurosarcoidosis and blindness who presented with weakness and urinary retention, falls.    PT Comments    Assisted pt from supine to EOB via "log roll" with increased time.  Limited sitting EOB due to pain.  Assisted with amb a limited distance.  Assisted back to bed.  Pain limits pt's activity level.  Follow Up Recommendations  SNF;CIR     Equipment Recommendations       Recommendations for Other Services       Precautions / Restrictions Precautions Precautions: Fall;Back Required Braces or Orthoses: Spinal Brace Spinal Brace: Thoracolumbosacral orthotic Other Brace/Splint: TLSO ordered by hospitalist, LPT spoke with Dr. Nicole Kindred, neurologist  and he did not recommend bracing and reported to PT that it was ok to begin mobility, with fx's being "stable"    Mobility  Bed Mobility Overal bed mobility: Needs Assistance Bed Mobility: Sidelying to Sit;Rolling Rolling: Max assist Sidelying to sit: Total assist       General bed mobility comments: assisted with rolling to R side then sidelying to sit EOB with use of rail and 75% VC's on proper tech.   Transfers Overall transfer level: Needs assistance Equipment used: Rolling walker (2 wheeled) Transfers: Sit to/from Stand Sit to Stand: Mod assist         General transfer comment: 50% VC's on proper hand placement and increased time due to pain  Ambulation/Gait Ambulation/Gait assistance: Mod assist Ambulation Distance (Feet): 10 Feet Assistive device: Rolling walker (2 wheeled)       General Gait Details: very short steps with increased time.  assist advancing walker due to decreased vision.  Amb from elevated bed to doorway, then turned around to amb back to bed.  Very limited activity tolerance due to pain.    Stairs             Wheelchair Mobility    Modified Rankin (Stroke Patients Only)       Balance                                    Cognition Arousal/Alertness: Awake/alert Behavior During Therapy: WFL for tasks assessed/performed Overall Cognitive Status: Within Functional Limits for tasks assessed                 General Comments: appeared at base    Exercises      General Comments        Pertinent Vitals/Pain Pain Assessment: 0-10 Pain Score: 9  Pain Location: Butt sitting EOB Pain Descriptors / Indicators: Constant;Throbbing Pain Intervention(s): Monitored during session;Premedicated before session;Repositioned    Home Living                      Prior Function            PT Goals (current goals can now be found in the care plan section) Progress towards PT goals: Progressing toward goals    Frequency  Min 3X/week    PT Plan      Co-evaluation             End of Session Equipment Utilized During Treatment: Gait belt Activity Tolerance: Patient limited by pain Patient left: in bed;with call bell/phone within reach     Time:  9450-3888 PT Time Calculation (min) (ACUTE ONLY): 25 min  Charges:  $Gait Training: 8-22 mins $Therapeutic Activity: 8-22 mins                    G Codes:      Rica Koyanagi  PTA [facility]  Acute  Rehab Pager      [phone]

## 2015-05-24 NOTE — PMR Pre-admission (Signed)
PMR Admission Coordinator Pre-Admission Assessment  Patient: Jamie [name] is an 59 y.o., female MRN: [id number] DOB: Feb 16, 1956 Height: 5' 4.5" (163.8 cm) Weight: 54.432 kg (120 lb)              Insurance Information HMO:     PPO:       PCP:       IPA:       80/20:       OTHER:   PRIMARY: Medicare A/B      Policy#: [id number]      Subscriber: [name] CM Name:        Phone#:       Fax#:   Pre-Cert#:        Employer: Not employed/ disabled Benefits:  Phone #:       Name: Checked in [place]. 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 right handed female with history of legally blind, sarcoidosis. Patient originally from [place] moved to the [place] area approximately 1 week ago he closer to her family. Her parents are elderly and live in [place].  Patient has lived alone independently with occasional assistive device prior to admission and had a hired caregiver couple hours a day to assist with ADLs.Since moving to [facility] she fell on 05/12/2015 with complaints of low back pain. In ED cranial CT scan after a fall 5/26 showed prominence of the fourth ventricle X-rays showed no evidence of fracture. Presented 05/20/2015 with recent fall onto her buttocks as well as numerous falls in the past.. On one of her latest fall she fell struck her head on the toilet. Noted complaints of dull headaches. Complains of sacral pain and numbness as well as bouts of incontinence of bowel  bladder. . Repeat cranial CT scan 05/20/2015 shows microvascular ischemic disease without definite acute intracranial process. MRI lumbar spine showed sacral fracture S1-S2 with mild angulation. Thoracic spine MRI demonstrated a small T10 superior endplate compression which appeared acute. Cervical spine MRI showed no compressive lesions no evidence of fractures. Orthopedic services Dr. Lorin Mercy consulted with advice of conservative care.TLSO ordered.  Neurology consulted for instability of gait with workup felt to be suspect nonfocal concussion and again advise conservative care. Flomax has been added for urinary retention.  Urinary catheter is currently in place.  Occupational therapy evaluation completed 05/22/2015 with recommendations of physical medicine rehabilitation consult.  Past Medical History  Past Medical History  Diagnosis Date  . 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 Sarcoidosis     Family History  family history includes Hypertension in her father.  Prior Rehab/Hospitalizations: Had PT on an outpatient basis previously in [place].  Has the patient had major surgery during 100 days prior to admission? No  Current Medications   Current facility-administered medications:  .  acetaminophen (TYLENOL) tablet 650 mg, 650 mg, Oral, Q6H PRN, 650 mg at  05/21/15 1522 **OR** [DISCONTINUED] acetaminophen (TYLENOL) suppository 650 mg, 650 mg, Rectal, Q6H PRN, Toy Baker, MD .  bisacodyl (DULCOLAX) EC tablet 10 mg, 10 mg, Oral, Daily, Thurnell Lose, MD, 10 mg at 05/24/15 0844 .  cefpodoxime (VANTIN) tablet 200 mg, 200 mg, Oral, Q12H, Rhetta Mura Schorr, NP, 200 mg at 05/24/15 0357 .  FLUoxetine (PROZAC) capsule 40 mg, 40 mg, Oral, Daily, Toy Baker, MD, 40 mg at 05/24/15 0844 .  HYDROcodone-acetaminophen (NORCO/VICODIN) 5-325 MG per tablet 1 tablet, 1 tablet, Oral, Q6H PRN, Thurnell Lose, MD, 1 tablet at 05/24/15 0844 .  levothyroxine (SYNTHROID, LEVOTHROID) tablet  100 mcg, 100 mcg, Oral, Daily, Toy Baker, MD, 100 mcg at 05/24/15 0844 .  [DISCONTINUED] ondansetron (ZOFRAN) tablet 4 mg, 4 mg, Oral, Q6H PRN **OR** ondansetron (ZOFRAN) injection 4 mg, 4 mg, Intravenous, Q6H PRN, Toy Baker, MD .  oxyCODONE (Oxy IR/ROXICODONE) immediate release tablet 5 mg, 5 mg, Oral, Q4H PRN, Toy Baker, MD, 5 mg at 05/22/15 2139 .  polyethylene glycol (MIRALAX / GLYCOLAX) packet 17 g, 17 g, Oral, BID, Thurnell Lose, MD, 17 g at 05/24/15 0843 .  tamsulosin (FLOMAX) capsule 0.4 mg, 0.4 mg, Oral, Daily, Thurnell Lose, MD, 0.4 mg at 05/24/15 0844  Patients Current Diet: Diet regular Room service appropriate?: No; Fluid consistency:: Thin Diet - low sodium heart healthy  Precautions / Restrictions Precautions Precautions: Fall, Back Spinal Brace: Thoracolumbosacral orthotic (not arrived yet) Other Brace/Splint: TLSO ordered by hospitalist, spoke with Dr. Nicole Kindred, neurologist today and he did not recommend bracing and reported to PT that it was ok to begin mobility, with fx's being "stable"   Has the patient had 2 or more falls or a fall with injury in the past year?Yes.  Patient has now had several falls with resultant injury now.  Has had several falls since 05/09/15.  Prior Activity Level Community (5-7x/wk): Went to adult day care 9 am to 1 pm M-F and enjoyed making wreaths.  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  Home Assistive Devices / Equipment Home Assistive Devices/Equipment: Bedside commode/3-in-1, Environmental consultant (specify type) Home Equipment: Bedside commode, Grab bars - tub/shower, Walker - 2 wheels, Shower seat, Cane - single point  Prior Device Use: Indicate devices/aids used by the patient prior to current illness, exacerbation or injury? Walker and None of the above.  Patient used a straight cane mostly outside.  Recently started using rolling walker after emergency room visit after a fall.  Prior  Functional Level Prior Function Level of Independence: Independent with assistive device(s) Comments: per parent's report  Self Care: Did the patient need help bathing, dressing, using the toilet or eating?  Independent, but she had a caregiver for a few hours each day.  Indoor Mobility: Did the patient need assistance with walking from room to room (with or without device)? Independent.  Patient says she walked very, very slowly.  Stairs: Did the patient need assistance with internal or external stairs (with or without device)? Independent  Functional Cognition: Did the patient need help planning regular tasks such as shopping or remembering to take medications? Needed some help.  Caregvier picked up medications and took her to appointments.  Current Functional Level Cognition  Overall Cognitive Status: Difficult to assess Following Commands: Follows one step commands with increased time    Extremity Assessment (includes Sensation/Coordination)  Upper Extremity Assessment: Defer to OT evaluation RUE Deficits / Details: very limited shoulder AROM bilaterally; PROM to 90 degrees shoulder flexion.  Able to perform elbow flex/ext and squeeze hand LUE Deficits / Details: very limited shoulder AROM bilaterally; PROM to 90 degrees shoulder flexion. Able to perform elbow flex/ext and squeeze hand  Lower Extremity Assessment: RLE deficits/detail, LLE deficits/detail RLE Deficits / Details: AAROM hip flexion to 90*, knee flexion to 80*, ankle AROM WFL; strength 2+/5;  RLE: Unable to fully assess due to pain    ADLs  Overall ADL's : Needs assistance/impaired Eating/Feeding: Total assistance Grooming: Maximal assistance Upper Body Dressing : Total assistance, Sitting Lower Body Dressing: Total assistance, Bed level Functional mobility during ADLs: Total assistance, +2 for physical assistance, +2 for safety/equipment (bed mobility, supine to sit only) General ADL Comments: Patient is blind. Has  had to have someone feed her in the hospital. Basically total care at this time. She reports, and her parents confirm, that she was independent  and living alone previously. Worked on bed mobility. Max A +2 roll to right, total A +2 supine to sit. Sat EOB total A initially, with brief period of min guard A. Total A +2 sit to supine. Total A +2 roll to left. Dependent to change gowns and change bed pad. Positioned comfortably in bed at end of session. Pain limits her.     Mobility  Overal bed mobility: Needs Assistance Bed Mobility: Rolling, Sidelying to Sit Rolling: +2 for physical assistance, Max assist, Total assist (max to R, Total to L) Sidelying to sit: +2 for physical assistance, Total assist General bed mobility comments: pt +0%; multi-modal  cues for     Transfers  General transfer comment: unable to test due to pain    Ambulation / Gait / Stairs / Wheelchair Mobility       Posture / Balance Dynamic Sitting Balance Sitting balance - Comments: pt varied total assist to brief min/guard;  Balance Overall balance assessment: Needs assistance Sitting-balance support: Feet supported, Feet unsupported, Bilateral upper extremity supported, Single extremity supported Sitting balance-Leahy Scale: Zero Sitting balance - Comments: pt varied total assist to brief min/guard;  Postural control: Posterior lean, Right lateral lean    Special needs/care consideration BiPAP/CPAP No CPM No Continuous Drip IV No Dialysis No        Life Vest No Oxygen No Special Bed No Trach Size No Wound Vac (area) No      Skin NO                               Bowel mgmt: Last BM 05/23/15 with problems of loose stool versus constipation.  Will need a bowel program Bladder mgmt: Urinary retention with urinary catheter inserted Diabetic mgmt No    Previous Home Environment Living Arrangements: Alone (per neuro note-pt had an "aide" ) Type of Home: Apartment Home Layout: One level Home Access: Level  entry Bathroom Shower/Tub: Multimedia programmer: Standard Bathroom Accessibility: Yes How Accessible: Accessible via walker Home Care Services: No Additional Comments: pt's elderly but very active and independent parents present for eval and  answered most questions, gave hx; They live in [place] but are planning a move to [place] in the fall of this year;  Discharge Living Setting Plans for Discharge Living Setting: Alone, Apartment (Lives alone.) Type of Home at Discharge: Apartment (Apt on 2nd level with elevator in the building.) Discharge Home Layout: One level Discharge Home Access: Level entry, Elevator Does the patient have any problems obtaining your medications?: No  Social/Family/Support Systems Patient Roles: Other (Comment) (  Has Dad, Mom and no children.) Contact Information: Geraldyne Barraclough - father (h) [phone] (c) [phone] Anticipated Caregiver: Can hire help as needed per parents.  Has had hired caregivers previously. Ability/Limitations of Caregiver: Parents live in [place] but are planning to Move to [place] in the fall. Caregiver Availability: Other (Comment) (Dad says will hire caregivers.) Discharge Plan Discussed with Primary Caregiver: Yes Is Caregiver In Agreement with Plan?: Yes Does Caregiver/Family have Issues with Lodging/Transportation while Pt is in Rehab?: No  Goals/Additional Needs Patient/Family Goal for Rehab: PT/OT min assist goals Expected length of stay: 16-21 days Cultural Considerations: Hosie Poisson Dietary Needs: Regular diet, thin liquids Equipment Needs: TBD Pt/Family Agrees to Admission and willing to participate: Yes Program Orientation Provided & Reviewed with Pt/Caregiver Including Roles  & Responsibilities: Yes  Decrease burden of Care through IP rehab admission: N/A  Possible need for SNF placement upon discharge: Yes, if she does not progress to where a caregiver can assist at home with patient.  Patient  Condition: This patient's condition remains as documented in the consult dated 05/23/15, in which the Rehabilitation Physician determined and documented that the patient's condition is appropriate for intensive rehabilitative care in an inpatient rehabilitation facility. Will admit to inpatient rehab today.  Preadmission Screen Completed By:  Retta Diones, 05/24/2015 12:47 PM ______________________________________________________________________   Discussed status with Dr. Letta Pate on 05/24/15 at 1247 and received telephone approval for admission today.  Admission Coordinator:  Retta Diones, time1247/Date06/07/16

## 2015-05-24 NOTE — Discharge Instructions (Signed)
Follow with Primary MD Olga Millers, MD and recommended Urologist in 7 days   Get CBC, CMP, 2 view Chest X ray checked  by Primary MD next visit.    Activity: As tolerated with Full fall precautions use walker/cane & assistance as needed   Disposition SNF   Diet: Heart Healthy  with feeding assistance and aspiration precautions.  For Heart failure patients - Check your Weight same time everyday, if you gain over 2 pounds, or you develop in leg swelling, experience more shortness of breath or chest pain, call your Primary MD immediately. Follow Cardiac Low Salt Diet and 1.5 lit/day fluid restriction.   On your next visit with your primary care physician please Get Medicines reviewed and adjusted.   Please request your Prim.MD to go over all Hospital Tests and Procedure/Radiological results at the follow up, please get all Hospital records sent to your Prim MD by signing hospital release before you go home.   If you experience worsening of your admission symptoms, develop shortness of breath, life threatening emergency, suicidal or homicidal thoughts you must seek medical attention immediately by calling 911 or calling your MD immediately  if symptoms less severe.  You Must read complete instructions/literature along with all the possible adverse reactions/side effects for all the Medicines you take and that have been prescribed to you. Take any new Medicines after you have completely understood and accpet all the possible adverse reactions/side effects.   Do not drive, operating heavy machinery, perform activities at heights, swimming or participation in water activities or provide baby sitting services if your were admitted for syncope or siezures until you have seen by Primary MD or a Neurologist and advised to do so again.  Do not drive when taking Pain medications.    Do not take more than prescribed Pain, Sleep and Anxiety Medications  Special Instructions: If you have  smoked or chewed Tobacco  in the last 2 yrs please stop smoking, stop any regular Alcohol  and or any Recreational drug use.  Wear Seat belts while driving.   Please note  You were cared for by a hospitalist during your hospital stay. If you have any questions about your discharge medications or the care you received while you were in the hospital after you are discharged, you can call the unit and asked to speak with the hospitalist on call if the hospitalist that took care of you is not available. Once you are discharged, your primary care physician will handle any further medical issues. Please note that NO REFILLS for any discharge medications will be authorized once you are discharged, as it is imperative that you return to your primary care physician (or establish a relationship with a primary care physician if you do not have one) for your aftercare needs so that they can reassess your need for medications and monitor your lab values.

## 2015-05-25 ENCOUNTER — Inpatient Hospital Stay (HOSPITAL_COMMUNITY): Payer: Medicare Other | Admitting: Occupational Therapy

## 2015-05-25 ENCOUNTER — Inpatient Hospital Stay (HOSPITAL_COMMUNITY): Payer: Medicare Other

## 2015-05-25 ENCOUNTER — Inpatient Hospital Stay (HOSPITAL_COMMUNITY): Payer: Medicare Other | Admitting: Physical Therapy

## 2015-05-25 LAB — CBC WITH DIFFERENTIAL/PLATELET
BASOS ABS: 0.1 10*3/uL (ref 0.0–0.1)
Basophils Relative: 2 % — ABNORMAL HIGH (ref 0–1)
Eosinophils Absolute: 0.5 10*3/uL (ref 0.0–0.7)
Eosinophils Relative: 8 % — ABNORMAL HIGH (ref 0–5)
HEMATOCRIT: 30.7 % — AB (ref 36.0–46.0)
Hemoglobin: 10.6 g/dL — ABNORMAL LOW (ref 12.0–15.0)
LYMPHS PCT: 27 % (ref 12–46)
Lymphs Abs: 1.7 10*3/uL (ref 0.7–4.0)
MCH: 28.4 pg (ref 26.0–34.0)
MCHC: 34.5 g/dL (ref 30.0–36.0)
MCV: 82.3 fL (ref 78.0–100.0)
MONO ABS: 0.3 10*3/uL (ref 0.1–1.0)
MONOS PCT: 5 % (ref 3–12)
NEUTROS PCT: 58 % (ref 43–77)
Neutro Abs: 3.8 10*3/uL (ref 1.7–7.7)
PLATELETS: 337 10*3/uL (ref 150–400)
RBC: 3.73 MIL/uL — ABNORMAL LOW (ref 3.87–5.11)
RDW: 15 % (ref 11.5–15.5)
WBC: 6.4 10*3/uL (ref 4.0–10.5)

## 2015-05-25 LAB — COMPREHENSIVE METABOLIC PANEL
ALK PHOS: 218 U/L — AB (ref 38–126)
ALT: 15 U/L (ref 14–54)
AST: 21 U/L (ref 15–41)
Albumin: 2.7 g/dL — ABNORMAL LOW (ref 3.5–5.0)
Anion gap: 10 (ref 5–15)
BUN: 13 mg/dL (ref 6–20)
CO2: 26 mmol/L (ref 22–32)
Calcium: 8.9 mg/dL (ref 8.9–10.3)
Chloride: 104 mmol/L (ref 101–111)
Creatinine, Ser: 0.51 mg/dL (ref 0.44–1.00)
GFR calc Af Amer: >60 mL/min (ref >60)
GFR calc non Af Amer: >60 mL/min (ref >60)
Glucose, Bld: 83 mg/dL (ref 65–99)
Potassium: 4 mmol/L (ref 3.5–5.1)
SODIUM: 140 mmol/L (ref 135–145)
Total Bilirubin: 0.3 mg/dL (ref 0.3–1.2)
Total Protein: 6 g/dL — ABNORMAL LOW (ref 6.5–8.1)

## 2015-05-25 MED ORDER — OXYCODONE HCL 5 MG PO TABS
10.0000 mg | ORAL_TABLET | ORAL | Status: DC | PRN
  Administered 2015-05-25 – 2015-06-10 (×22): 10 mg via ORAL
  Filled 2015-05-25 (×23): qty 2

## 2015-05-25 NOTE — Progress Notes (Signed)
Physical Therapy Session Note  Patient Details  Name: Jamie [name] MRN: [id number] Date of Birth: May 27, 1956  Today's Date: 05/25/2015 PT Individual Time: 1515-1600 PT Individual Time Calculation (min): 45 min   Short Term Goals: Week 1:  PT Short Term Goal 1 (Week 1): Pt will perform bed mobility (R/L rolling, scooting to [facility]) with supervision and min cues PT Short Term Goal 2 (Week 1): Pt will perform bed <-> w/c transfer with supervision and min cues for positioning/placement due to visual impairments PT Short Term Goal 3 (Week 1): Pt will perform ambulation with RW x75 ft with min guard and min cues for navigation PT Short Term Goal 4 (Week 1): Pt will perform ascent/descent of 6 3-inch stair with minA and min verbal cues PT Short Term Goal 5 (Week 1): Pt will improve sitting tolerance in tilt-in-space w/c to 30 min in reclined position  Skilled Therapeutic Interventions/Progress Updates:   Pt already in bed for evening and rated bil IT pain 8/10.  Pt seen bedside for for therapeutic exercise performedin supine with LEs to increase strength for functional mobility: 10 x 1 R/L straight leg raises, L/R short arc quad knee ext, L/R hip abd/add moving LE off/on bed;  bil hip adduction against resistance for core activation, pelvic tilts.  Pt requested sips of drink, while HOB elevated.  Pt coughed Q sip.  Pt positioned on R side and was able to produce slightly stronger cough and clear throat.  +2 assist to reposition pt toward [facility], with pt assisting slighlty pushing with bil feet.  Pt left resting in supine with soft call bell on chest, and radio on.  Pt unable to see red button on phone.  Will consult primary PT about phone and cough after liquids..  Therapy Documentation Precautions:  Precautions Precautions: Fall, Back Required Braces or Orthoses: Spinal Brace Spinal Brace: Thoracolumbosacral orthotic Other Brace/Splint: Ordered PRN Restrictions Weight Bearing Restrictions:  No Pain: Pain Assessment Pain Assessment: 0-10 Pain Score: 8  Pain Type: Neuropathic pain Pain Location: Buttocks (bil ) Pain Orientation: Mid Pain Descriptors / Indicators: Radiating;Pins and needles Pain Frequency: Intermittent Pain Onset: On-going Patients Stated Pain Goal: 4 Pain Intervention(s): Medication (See eMAR) Multiple Pain Sites: No    See FIM for current functional status  Therapy/Group: Individual Therapy  [name] 05/25/2015, 4:10 PM

## 2015-05-25 NOTE — Evaluation (Signed)
Physical Therapy Assessment and Plan  Patient Details  Name: Jamie [name] MRN: [id number] Date of Birth: 1956/10/05  PT Diagnosis: Abnormality of gait, Difficulty walking, Impaired sensation, Muscle weakness and Pain in low back/sacrum Rehab Potential: Fair ELOS: 3 weeks   Today's Date: 05/25/2015 PT Individual Time: 1515-1600 PT Individual Time Calculation (min): 45 min    Problem List:  Patient Active Problem List   Diagnosis Date Noted  . 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 Neurogenic bowel 05/24/2015  . 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 Hypotonic neurogenic bladder   . Leg weakness   . Sacral fracture, closed 05/21/2015  . 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 UTI (lower urinary tract infection) 05/21/2015  . Urinary retention 05/21/2015    Past Medical History:  Past Medical History  Diagnosis Date  . 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 Progressive gait disorder     Was treated for probable MS until diagnosed with Neurosarcoidosis about 10 years ago.    Past Surgical History: No past surgical history on file.  Assessment & Plan Clinical Impression: Patient is a 59 y.o. year old female with recent admission to the hospital on 05/20/15 with recurrent fall, HA, sacral pain, and bowel/bladder incontinence. Notable hx of neurosarcoidosis, progressive gait disorder, and significant visual impairments.  Patient transferred to CIR on 05/24/2015 .   Patient currently requires min with mobility secondary to muscle weakness, ataxia and decreased coordination, decreased visual acuity and decreased sitting balance, decreased standing balance, decreased postural control and decreased balance strategies.  Prior to hospitalization, patient was supervision with mobility and lived with Alone in a [place] home.  Home access is  Level entry, Elevator.  Patient will benefit from  skilled PT intervention to maximize safe functional mobility, minimize fall risk and decrease caregiver burden for planned discharge to home with 24 hour supervision.  Anticipate patient will benefit from follow up [place] at discharge.  PT - End of Session Activity Tolerance: Tolerates 30+ min activity with multiple rests Endurance Deficit: Yes Endurance Deficit Description: limited by pain PT Assessment Rehab Potential (ACUTE/IP ONLY): Fair Barriers to Discharge: Decreased caregiver support (parents planning to move nearby, however are currently unable to provide full time care. Would need to hire caregiver.) PT Patient demonstrates impairments in the following area(s): Balance;Safety;Sensory;Endurance;Motor;Pain PT Transfers Functional Problem(s): Bed Mobility;Bed to Chair;Car;Furniture PT Locomotion Functional Problem(s): Ambulation;Wheelchair Mobility;Stairs PT Plan PT Intensity: Minimum of 1-2 x/day ,45 to 90 minutes PT Frequency: 5 out of 7 days PT Duration Estimated Length of Stay: 3 weeks PT Treatment/Interventions: Ambulation/gait training;Balance/vestibular training;Community reintegration;DME/adaptive equipment instruction;Functional mobility training;Neuromuscular re-education;Patient/family education;Pain management;Stair training;Therapeutic Exercise;UE/LE Coordination activities;Wheelchair propulsion/positioning;UE/LE Strength taining/ROM;Therapeutic Activities;Psychosocial support PT Transfers Anticipated Outcome(s): supervision overall PT Locomotion Anticipated Outcome(s): supervision overall, min/mod for w/c mobility d/t visual impairments PT Recommendation Recommendations for Other Services: Neuropsych consult;Speech consult Follow Up Recommendations: Home health PT;24 hour supervision/assistance Patient destination: Home Equipment Recommended: To be determined  Skilled Therapeutic Intervention Pt received supine in bed with HOB elevated; reports pain 9/10 as described in  detail below; however is agreeable to treatment. Initial PT eval performed and completed with assessment below. Initiated transfer, gait, and stair training. Unable to assess w/c mobility at this time as pt is utilizing tilt-in-space w/c due to significant pain in seated position, somewhat relieved when reclined. Pt had one episode of incontinent bowel movement during session; instructed pt  in bed mobility to perform cleaning/hygiene with maxA. Pt demonstrates limited ability to participate maximally or perform functional mobility tasks due to pain from sacral fracture. Pt educated on role of rehab and physical therapy to improve safety and mobility for safe d/c. Pt stated goals include "walking better and faster, getting home".   PT Evaluation Precautions/Restrictions Precautions Precautions: Fall;Back Required Braces or Orthoses: Spinal Brace Spinal Brace: Thoracolumbosacral orthotic Other Brace/Splint: Ordered PRN Restrictions Weight Bearing Restrictions: No Pain Pain Assessment Pain Assessment: 0-10 Pain Score: 8  Pain Type: Neuropathic pain Pain Location: Buttocks (bil ) Pain Orientation: Mid Pain Descriptors / Indicators: Radiating;Pins and needles Pain Frequency: Intermittent Pain Onset: On-going Patients Stated Pain Goal: 4 Pain Intervention(s): Medication (See eMAR) Multiple Pain Sites: No Home Living/Prior Functioning Home Living Living Arrangements: Alone Available Help at Discharge: Personal care attendant;Available PRN/intermittently Type of Home: Apartment Home Access: Level entry;Elevator Home Layout: One level Additional Comments: pt's elderly but very active and independent parents live in [place] but are planning a move to [facility] in the fall of this year  Lives With: Alone Prior Function Level of Independence: Independent with transfers;Requires assistive device for independence;Independent with gait  Able to Take Stairs?: No Driving: No Vocation: On  disability Vision/Perception   Grossly impaired: legally blind at baseline  Cognition Overall Cognitive Status: Within Functional Limits for tasks assessed Arousal/Alertness: Awake/alert Orientation Level: Oriented X4 Attention: Selective Selective Attention: Appears intact Memory: Appears intact Awareness: Appears intact Safety/Judgment: Appears intact Sensation Sensation Light Touch: Impaired Detail Light Touch Impaired Details: Impaired RLE;Impaired LLE (Impaired BLEs along S1 dermatome; reports absent sensation in groin) Stereognosis: Not tested Hot/Cold: Not tested Proprioception: Not tested Coordination Gross Motor Movements are Fluid and Coordinated: No Fine Motor Movements are Fluid and Coordinated: Yes Heel Shin Test: Unable to formally assess due to pain with LE movement Motor  Motor Motor: Other (comment) (BLEs grossly 3+/5; further strength testing against resistance held at this time due to pain)  Mobility Bed Mobility Bed Mobility: Rolling Right;Rolling Left;Left Sidelying to Sit;Sit to Supine;Scooting to [facility] Rolling Right: 4: Min assist Rolling Right Details: Verbal cues for technique;Manual facilitation for placement Rolling Right Details (indicate cue type and reason): Use of bedrail Rolling Left: 4: Min assist Rolling Left Details: Verbal cues for technique;Manual facilitation for placement Rolling Left Details (indicate cue type and reason): use of bed rail Left Sidelying to Sit: 3: Mod assist;HOB flat Left Sidelying to Sit Details: Verbal cues for technique;Manual facilitation for placement Sit to Supine: 3: Mod assist Sit to Supine - Details: Verbal cues for sequencing;Manual facilitation for placement Scooting to HOB: 2: Max assist;1: +2 Total assist Scooting to [facility] Details: Verbal cues for sequencing;Tactile cues for placement Transfers Transfers: Yes Sit to Stand: 4: Min assist Sit to Stand Details: Tactile cues for placement;Verbal cues for safe  use of DME/AE Stand to Sit: 4: Min assist Stand to Sit Details (indicate cue type and reason): Tactile cues for placement;Verbal cues for safe use of DME/AE Stand Pivot Transfers: 4: Min assist Stand Pivot Transfer Details: Verbal cues for safe use of DME/AE;Tactile cues for placement Locomotion  Ambulation Ambulation: Yes Ambulation/Gait Assistance: 4: Min guard Ambulation Distance (Feet): 25 Feet Assistive device: Rolling walker Gait Gait: Yes Gait Pattern: Impaired Gait Pattern: Decreased step length - left;Decreased step length - right;Right foot flat;Left foot flat;Trunk flexed;Decreased trunk rotation Stairs / Additional Locomotion Stairs: Yes Stairs Assistance: 4: Min assist Stairs Assistance Details: Verbal cues for technique;Manual facilitation for placement Stairs Assistance Details (  indicate cue type and reason): verbal cues for sequencing and LE placement due to visual impairments and pt hesitancy to perform Stair Management Technique: Two rails Number of Stairs: 2 Height of Stairs: 3 Wheelchair Mobility Wheelchair Mobility: No (Pt in tilt-in-space w/c, unable to manually propel)  Trunk/Postural Assessment  Cervical Assessment Cervical Assessment: Within Functional Limits Thoracic Assessment Thoracic Assessment: Exceptions to [facility] thoracic kyphosis) Lumbar Assessment Lumbar Assessment: Within Functional Limits Postural Control Postural Control: Deficits on evaluation  Balance Balance Balance Assessed: Yes Static Sitting Balance Static Sitting - Balance Support: Bilateral upper extremity supported;Feet unsupported Static Sitting - Level of Assistance: 5: Stand by assistance Static Sitting - Comment/# of Minutes: approx 2 min prior to transfer Dynamic Sitting Balance Dynamic Sitting - Balance Support: Bilateral upper extremity supported Dynamic Sitting - Level of Assistance: 4: Min assist Sitting balance - Comments: Pt varies modA to min/guard Static  Standing Balance Static Standing - Balance Support: Bilateral upper extremity supported Static Standing - Level of Assistance: 5: Stand by assistance Dynamic Standing Balance Dynamic Standing - Balance Support: Bilateral upper extremity supported Dynamic Standing - Level of Assistance: 5: Stand by assistance Extremity Assessment  RUE Assessment RUE Assessment: Exceptions to [facility] RUE AROM (degrees) RUE Overall AROM Comments: shoulder flexion grossly 100 degrees before reports of pain; elbow, wrist, and fingers WFL RUE PROM (degrees) RUE Overall PROM Comments: shoulder flexion grossly 120 degrees before reports of pain RUE Strength RUE Overall Strength Comments: strength grossly 4+/5 at bicep/tricep LUE Assessment LUE Assessment: Exceptions to [facility] LUE AROM (degrees) LUE Overall AROM Comments: shoulder flexion approx 100 degrees before reports of pain; elbow, wrist, and fingers WFL LUE Strength LUE Overall Strength Comments: strength grossly 4+/5 at bicep/tricep RLE Assessment RLE Assessment: Exceptions to [facility] RLE AROM (degrees) Overall AROM Right Lower Extremity: Deficits;Due to pain RLE Overall AROM Comments: Grossly limited overall due to pain RLE Strength RLE Overall Strength Comments: BLEs grossly 3+/5, unable to test proximal LEs due to pain. B dorsiflexion 4+/5 LLE Assessment LLE Assessment: Exceptions to WFL LLE AROM (degrees) Overall AROM Left Lower Extremity: Deficits;Due to pain LLE Overall AROM Comments: Gossly limited due to pain LLE Strength LLE Overall Strength Comments: BLEs grossly 3+/5, unable to test proximal LEs due to pain. B dorsiflexion 4+/5  FIM:  FIM - Bed/Chair Transfer Bed/Chair Transfer Assistive Devices: Copy: 3: Supine > Sit: Mod A (lifting assist/Pt. 50-74%/lift 2 legs;3: Sit > Supine: Mod A (lifting assist/Pt. 50-74%/lift 2 legs);3: Bed > Chair or W/C: Mod A (lift or lower assist);4: Chair or W/C > Bed: Min A (steadying Pt. >  75%) FIM - Locomotion: Wheelchair Locomotion: Wheelchair: 0: Activity did not occur (Pt in tilt-in-space w/c, unable to manually propel) FIM - Locomotion: Ambulation Locomotion: Ambulation Assistive Devices: Administrator Ambulation/Gait Assistance: 4: Min guard Locomotion: Ambulation: 1: Travels less than 50 ft with minimal assistance (Pt.>75%) FIM - Locomotion: Stairs Locomotion: Scientist, physiological: Hand rail - 2 Locomotion: Stairs: 1: Up and Down < 4 stairs with minimal assistance (Pt.>75%)   Refer to Care Plan for Long Term Goals  Recommendations for other services: Neuropsych and Other: Speech therapy  Discharge Criteria: Patient will be discharged from PT if patient refuses treatment 3 consecutive times without medical reason, if treatment goals not met, if there is a change in medical status, if patient makes no progress towards goals or if patient is discharged from hospital.  The above assessment, treatment plan, treatment alternatives and goals were discussed and mutually  agreed upon: by patient (no family available during evaluation).  Benjiman Core Tygielski 05/25/2015, 4:10 PM

## 2015-05-25 NOTE — Progress Notes (Signed)
Social Work Assessment and Plan Social Work Assessment and Plan  Patient Details  Name: Jamie [name] MRN: [id number] Date of Birth: 1956/04/28  Today's Date: 05/25/2015  Problem List:  Patient Active Problem List   Diagnosis Date Noted  . 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 Neurogenic bowel 05/24/2015  . 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 Hypotonic neurogenic bladder   . Leg weakness   . Sacral fracture, closed 05/21/2015  . 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 UTI (lower urinary tract infection) 05/21/2015  . Urinary retention 05/21/2015   Past Medical History:  Past Medical History  Diagnosis Date  . 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 Progressive gait disorder     Was treated for probable MS until diagnosed with Neurosarcoidosis about 10 years ago.    Past Surgical History: No past surgical history on file. Social History:  reports that she has never smoked. She does not have any smokeless tobacco history on file. She reports that she does not drink alcohol or use illicit drugs.  Family / Support Systems Marital Status: Single Patient Roles: Other (Comment) (Child) Other Supports: Jamie [name]-father  [phone]  [phone]-cell   Anticipated Caregiver: Unsure at this time Ability/Limitations of Caregiver: has no discharge plan currently-recently moved here from [facility] Caregiver Availability: Other (Comment) (Needs to come up with a discharge plan) Family Dynamics: Parents are eldery 48 and 15 yo respectively and live in [place].  They are planning to move into [facility] this coming fall. Pt lives in [place]. which is a elderly senior apartment complex. She has a sister in [place] who is 31 years older.  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  According to MD pt is capable of making her own decisions while here.   Abuse/Neglect Physical Abuse: Denies Verbal Abuse: Denies Sexual Abuse: Denies Exploitation of patient/patient's resources: Denies Self-Neglect: Denies  Emotional Status Pt's affect, behavior adn adjustment status: Pt is motivated but limited by pain with her sacrum.  She wants to do well here, but will need care also at discharge.  She just moved to [facility] and has no services in place yet.  Unsure how she was managing before coming back into the hospital for the second time.  Her vision limits her also. Recent Psychosocial Issues: other health issues-vision, neuro-sarcidosis Pyschiatric History: No history would benefit form neuro-psych seeing while here to assist with coping and adjustment issues. Will also see to provide support too. Substance Abuse History: No issues  Patient / Family Perceptions, Expectations & Goals Pt/Family understanding of illness & functional limitations: Pt and father are able to explain her fracture and pain issues.  All are glad she is here on rehab and will get therapies to assist wiht her function.  She and father talk wiht MD's and feel their questions are being addressed. Premorbid pt/family roles/activities: Child, disabled, chiropractor, sibling, etc Anticipated changes in roles/activities/participation: resume Pt/family expectations/goals: Pt states: " I know it is a mess, I will need help.  If only the pain was not so bad I could do more."  Father states: " I could not help her I am glad she is here."  Community Resources [employer]: Other (Comment) ([facility] [address]-senior apt) Premorbid Home Care/DME Agencies: Other (Comment) [facility]- saw one time before admission) Transportation  available at discharge: Will need to hire caregiver to provide this-parents drive but visit weekly Resource referrals recommended: Neuropsychology, Support group (specify)  Discharge Planning Living Arrangements: Alone Support Systems: Parent Type of Residence: Private residence Insurance Resources: [employer] Financial Resources: SSD Financial Screen Referred: No Living Expenses: Rent Money Management: Patient Does the patient have any problems obtaining your medications?: No Home Management: Pt will need to hire someone to do meals and cleaning tasks Patient/Family Preliminary Plans: Unsure at this time, she will probably need 24 hr care at discharge from rehab.  She may need to go to a NH for short time then transition back to apartment when recovered. Do not think [facility] allows 24 hr caregivers, in order to live there you need to be independent.  Parents are currently living in [place] and have yet to move to [place], so not here often. Social Work Anticipated Follow Up Needs: HH/OP, SNF, Support Group  Clinical Impression Pleasant female who unfortunately fell and fractured her sacrum when moving here to [place].  She has yet to hire a caregiver to assist her with home management and transportation. She will require care at discharge from rehab and probably will require NHP until can return to apartment.  Will work with pt and parents on the safest discharge plan and provide support to pt while here. She will need much assist with discharge planning and community resources available here since it is all new to her.  Jamie [name] 05/25/2015, 3:26 PM

## 2015-05-25 NOTE — Progress Notes (Signed)
Patient information reviewed and entered into eRehab system by Shronda Boeh, RN, CRRN, PPS Coordinator.  Information including medical coding and functional independence measure will be reviewed and updated through discharge.     Per nursing patient was given "Data Collection Information Summary for Patients in Inpatient Rehabilitation Facilities with attached "Privacy Act Statement-Health Care Records" upon admission.  

## 2015-05-25 NOTE — Progress Notes (Signed)
Occupational Therapy Session Note  Patient Details  Name: Jamie [name] MRN: [id number] Date of Birth: 11/02/1956  Today's Date: 05/25/2015 OT Individual Time: 1003-1103 OT Individual Time Calculation (min): 60 min    Short Term Goals: Week 1:  OT Short Term Goal 1 (Week 1): Pt will complete bathing at mod assist level from EOB OT Short Term Goal 2 (Week 1): Pt will complete LB dressing at sit > stand level with max assist OT Short Term Goal 3 (Week 1): Pt will complete toilet transfer with RW with min assist OT Short Term Goal 4 (Week 1): Pt will complete shower transfer with RW with mod assist  Skilled Therapeutic Interventions/Progress Updates:   Pt worked on UB bathing and dressing sitting EOB during session.  Therapist left bed inflated in order for pt to get more relief and tolerate sitting longer period of time.  She was able to transfer to sitting on the right side with min assist and mod instructional cueing to follow back precautions.  She demonstrated slight decreased balance in sitting while engaged in bathing and dressing, requiring min guard assist for balance.  She tolerated sitting for 7-8 mins before needing to lay down secondary to the pain.  Therapist had to place trash can laid on its side for support under her feet while sitting secondary to height of elevated bed.  Therapist let her lay down for relief for 3-4 mins before returning back to sitting and transferring from bed to wheelchair with use of the RW.  She was able to transfer with mod assist using the RW to the wheelchair.  She completed grooming task of brushing her teeth from wheelchair level.  Pt's father and mother present for end of session.  They plan on bringing in pt's talking watch so she can work on sitting tolerance.  Pt left in wheelchair with soft touch call button within reach.    Therapy Documentation Precautions:  Precautions Precautions: Fall, Back Required Braces or Orthoses: Spinal Brace Spinal Brace:  Thoracolumbosacral orthotic Other Brace/Splint: Ordered PRN Restrictions Weight Bearing Restrictions: No  Pain: Pain Assessment Pain Assessment: 0-10 Pain Score: 8  Pain Type: Neuropathic pain Pain Location: Buttocks (bil ) Pain Orientation: Mid Pain Descriptors / Indicators: Radiating;Pins and needles Pain Frequency: Intermittent Pain Onset: On-going Patients Stated Pain Goal: 4 Pain Intervention(s): Medication (See eMAR) Multiple Pain Sites: No ADL: See FIM for current functional status  Therapy/Group: Individual Therapy  Janiya Millirons OTR/L 05/25/2015, 3:41 PM

## 2015-05-25 NOTE — Progress Notes (Signed)
Retta Diones, RN Rehab Admission Coordinator Signed Physical Medicine and Rehabilitation PMR Pre-admission 05/24/2015 12:31 PM  Related encounter: ED to Hosp-Admission (Discharged) from 05/20/2015 in [facility]   PMR Admission Coordinator Pre-Admission Assessment  Patient: Jamie [name] is an 59 y.o., female MRN: [id number] DOB: [birth year] Height: 5' 4.5" (163.8 cm) Weight: 54.432 kg (120 lb)  Insurance Information HMO: PPO: PCP: IPA: 80/20: OTHER:  PRIMARY: Medicare A/B Policy#: [id number] Subscriber: [name] CM Name: Phone#: Fax#:  Pre-Cert#: Employer: Not employed/ disabled Benefits: Phone #: Name: Checked in [place]. Date: 08/17/04 Deduct: $1288 Out of Pocket Max: none Life Max: unlimited CIR: 100% SNF: 100 days Outpatient: 80% Co-Pay: 20% Home Health: 100% Co-Pay: none DME: 80% Co-Pay: 20% Providers: patient's choice  Emergency Contact Information Contact Information    Name Relation Home Work Mobile   Pinkham,Leon Father [phone]  [phone]     Current Medical History  Patient Admitting Diagnosis: Sacral fracture with sacral nerve root injury causing neurogenic bowel and bladder  History of Present Illness: A 59 y.o. right handed female with history of legally blind, sarcoidosis. Patient originally from [place] moved to the [place] area approximately 1 week ago he closer to her family. Her parents are elderly and live in [place]. Patient has lived alone independently with occasional assistive device prior to admission and had a hired caregiver couple hours a day to assist with ADLs.Since moving to [facility] she fell on 05/12/2015 with  complaints of low back pain. In ED cranial CT scan after a fall 5/26 showed prominence of the fourth ventricle X-rays showed no evidence of fracture. Presented 05/20/2015 with recent fall onto her buttocks as well as numerous falls in the past.. On one of her latest fall she fell struck her head on the toilet. Noted complaints of dull headaches. Complains of sacral pain and numbness as well as bouts of incontinence of bowel bladder. . Repeat cranial CT scan 05/20/2015 shows microvascular ischemic disease without definite acute intracranial process. MRI lumbar spine showed sacral fracture S1-S2 with mild angulation. Thoracic spine MRI demonstrated a small T10 superior endplate compression which appeared acute. Cervical spine MRI showed no compressive lesions no evidence of fractures. Orthopedic services Dr. Lorin Mercy consulted with advice of conservative care.TLSO ordered. Neurology consulted for instability of gait with workup felt to be suspect nonfocal concussion and again advise conservative care. Flomax has been added for urinary retention. Urinary catheter is currently in place. Occupational therapy evaluation completed 05/22/2015 with recommendations of physical medicine rehabilitation consult.  Past Medical History  Past Medical History  Diagnosis Date  . 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 Sarcoidosis     Family History  family history includes Hypertension in her father.  Prior Rehab/Hospitalizations: Had PT on an outpatient basis previously in [place].  Has the patient had major surgery during 100 days prior to admission? No  Current Medications   Current facility-administered medications:  . acetaminophen (TYLENOL) tablet 650 mg, 650 mg, Oral, Q6H PRN, 650 mg at 05/21/15 1522 **OR** [DISCONTINUED] acetaminophen (TYLENOL) suppository 650 mg, 650 mg, Rectal, Q6H PRN, Toy Baker, MD . bisacodyl (DULCOLAX) EC tablet 10 mg, 10 mg, Oral, Daily, Thurnell Lose,  MD, 10 mg at 05/24/15 0844 . cefpodoxime (VANTIN) tablet 200 mg, 200 mg, Oral, Q12H, Rhetta Mura Schorr, NP, 200 mg at 05/24/15 0357 . FLUoxetine (PROZAC) capsule 40 mg, 40 mg, Oral, Daily, Toy Baker, MD, 40 mg at 05/24/15 0844 .  HYDROcodone-acetaminophen (NORCO/VICODIN) 5-325 MG per tablet 1 tablet, 1 tablet, Oral, Q6H PRN, Thurnell Lose, MD, 1 tablet at 05/24/15 0844 . levothyroxine (SYNTHROID, LEVOTHROID) tablet 100 mcg, 100 mcg, Oral, Daily, Toy Baker, MD, 100 mcg at 05/24/15 0844 . [DISCONTINUED] ondansetron (ZOFRAN) tablet 4 mg, 4 mg, Oral, Q6H PRN **OR** ondansetron (ZOFRAN) injection 4 mg, 4 mg, Intravenous, Q6H PRN, Toy Baker, MD . oxyCODONE (Oxy IR/ROXICODONE) immediate release tablet 5 mg, 5 mg, Oral, Q4H PRN, Toy Baker, MD, 5 mg at 05/22/15 2139 . polyethylene glycol (MIRALAX / GLYCOLAX) packet 17 g, 17 g, Oral, BID, Thurnell Lose, MD, 17 g at 05/24/15 0843 . tamsulosin (FLOMAX) capsule 0.4 mg, 0.4 mg, Oral, Daily, Thurnell Lose, MD, 0.4 mg at 05/24/15 0844  Patients Current Diet: Diet regular Room service appropriate?: No; Fluid consistency:: Thin Diet - low sodium heart healthy  Precautions / Restrictions Precautions Precautions: Fall, Back Spinal Brace: Thoracolumbosacral orthotic (not arrived yet) Other Brace/Splint: TLSO ordered by hospitalist, spoke with Dr. Nicole Kindred, neurologist today and he did not recommend bracing and reported to PT that it was ok to begin mobility, with fx's being "stable"   Has the patient had 2 or more falls or a fall with injury in the past year?Yes. Patient has now had several falls with resultant injury now. Has had several falls since 05/09/15.  Prior Activity Level Community (5-7x/wk): Went to adult day care 9 am to 1 pm M-F and enjoyed making wreaths. 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  Home Assistive Devices / Equipment Home Assistive Devices/Equipment:  Bedside commode/3-in-1, Environmental consultant (specify type) Home Equipment: Bedside commode, Grab bars - tub/shower, Walker - 2 wheels, Shower seat, Cane - single point  Prior Device Use: Indicate devices/aids used by the patient prior to current illness, exacerbation or injury? Walker and None of the above. Patient used a straight cane mostly outside. Recently started using rolling walker after emergency room visit after a fall.  Prior Functional Level Prior Function Level of Independence: Independent with assistive device(s) Comments: per parent's report  Self Care: Did the patient need help bathing, dressing, using the toilet or eating? Independent, but she had a caregiver for a few hours each day.  Indoor Mobility: Did the patient need assistance with walking from room to room (with or without device)? Independent. Patient says she walked very, very slowly.  Stairs: Did the patient need assistance with internal or external stairs (with or without device)? Independent  Functional Cognition: Did the patient need help planning regular tasks such as shopping or remembering to take medications? Needed some help. Caregvier picked up medications and took her to appointments.  Current Functional Level Cognition  Overall Cognitive Status: Difficult to assess Following Commands: Follows one step commands with increased time   Extremity Assessment (includes Sensation/Coordination)  Upper Extremity Assessment: Defer to OT evaluation RUE Deficits / Details: very limited shoulder AROM bilaterally; PROM to 90 degrees shoulder flexion. Able to perform elbow flex/ext and squeeze hand LUE Deficits / Details: very limited shoulder AROM bilaterally; PROM to 90 degrees shoulder flexion. Able to perform elbow flex/ext and squeeze hand  Lower Extremity Assessment: RLE deficits/detail, LLE deficits/detail RLE Deficits / Details: AAROM hip flexion to 90*, knee flexion to 80*, ankle AROM WFL; strength 2+/5;   RLE: Unable to fully assess due to pain    ADLs  Overall ADL's : Needs assistance/impaired Eating/Feeding: Total assistance Grooming: Maximal assistance Upper Body Dressing : Total assistance, Sitting Lower Body Dressing: Total  assistance, Bed level Functional mobility during ADLs: Total assistance, +2 for physical assistance, +2 for safety/equipment (bed mobility, supine to sit only) General ADL Comments: Patient is blind. Has had to have someone feed her in the hospital. Basically total care at this time. She reports, and her parents confirm, that she was independent and living alone previously. Worked on bed mobility. Max A +2 roll to right, total A +2 supine to sit. Sat EOB total A initially, with brief period of min guard A. Total A +2 sit to supine. Total A +2 roll to left. Dependent to change gowns and change bed pad. Positioned comfortably in bed at end of session. Pain limits her.     Mobility  Overal bed mobility: Needs Assistance Bed Mobility: Rolling, Sidelying to Sit Rolling: +2 for physical assistance, Max assist, Total assist (max to R, Total to L) Sidelying to sit: +2 for physical assistance, Total assist General bed mobility comments: pt +0%; multi-modal cues for     Transfers  General transfer comment: unable to test due to pain    Ambulation / Gait / Stairs / Wheelchair Mobility       Posture / Balance Dynamic Sitting Balance Sitting balance - Comments: pt varied total assist to brief min/guard;  Balance Overall balance assessment: Needs assistance Sitting-balance support: Feet supported, Feet unsupported, Bilateral upper extremity supported, Single extremity supported Sitting balance-Leahy Scale: Zero Sitting balance - Comments: pt varied total assist to brief min/guard;  Postural control: Posterior lean, Right lateral lean    Special needs/care consideration BiPAP/CPAP No CPM No Continuous Drip IV No Dialysis No  Life Vest  No Oxygen No Special Bed No Trach Size No Wound Vac (area) No  Skin NO  Bowel mgmt: Last BM 05/23/15 with problems of loose stool versus constipation. Will need a bowel program Bladder mgmt: Urinary retention with urinary catheter inserted Diabetic mgmt No    Previous Home Environment Living Arrangements: Alone (per neuro note-pt had an "aide" ) Type of Home: Apartment Home Layout: One level Home Access: Level entry Bathroom Shower/Tub: Multimedia programmer: Standard Bathroom Accessibility: Yes How Accessible: Accessible via walker Home Care Services: No Additional Comments: pt's elderly but very active and independent parents present for eval and answered most questions, gave hx; They live in [place] but are planning a move to [place] in the fall of this year;  Discharge Living Setting Plans for Discharge Living Setting: Alone, Apartment (Lives alone.) Type of Home at Discharge: Apartment (Apt on 2nd level with elevator in the building.) Discharge Home Layout: One level Discharge Home Access: Level entry, Elevator Does the patient have any problems obtaining your medications?: No  Social/Family/Support Systems Patient Roles: Other (Comment) (Has Dad, Mom and no children.) Contact Information: Francee Setzer - father (h) [phone] (c) [phone] Anticipated Caregiver: Can hire help as needed per parents. Has had hired caregivers previously. Ability/Limitations of Caregiver: Parents live in [place] but are planning to Move to [place] in the fall. Caregiver Availability: Other (Comment) (Dad says will hire caregivers.) Discharge Plan Discussed with Primary Caregiver: Yes Is Caregiver In Agreement with Plan?: Yes Does Caregiver/Family have Issues with Lodging/Transportation while Pt is in Rehab?: No  Goals/Additional Needs Patient/Family Goal for Rehab: PT/OT min assist goals Expected length of stay: 16-21  days Cultural Considerations: Hosie Poisson Dietary Needs: Regular diet, thin liquids Equipment Needs: TBD Pt/Family Agrees to Admission and willing to participate: Yes Program Orientation Provided & Reviewed with Pt/Caregiver Including Roles & Responsibilities: Yes  Decrease burden of  Care through IP rehab admission: N/A  Possible need for SNF placement upon discharge: Yes, if she does not progress to where a caregiver can assist at home with patient.  Patient Condition: This patient's condition remains as documented in the consult dated 05/23/15, in which the Rehabilitation Physician determined and documented that the patient's condition is appropriate for intensive rehabilitative care in an inpatient rehabilitation facility. Will admit to inpatient rehab today.  Preadmission Screen Completed By: Retta Diones, 05/24/2015 12:47 PM ______________________________________________________________________  Discussed status with Dr. Letta Pate on 05/24/15 at 1247 and received telephone approval for admission today.  Admission Coordinator: Retta Diones, time1247/Date06/07/16          Cosigned by: Charlett Blake, MD at 05/24/2015 12:53 PM  Revision History     Date/Time User Provider Type Action   05/24/2015 12:53 PM Charlett Blake, MD Physician Cosign   05/24/2015 12:52 PM Retta Diones, RN Rehab Admission Coordinator Sign

## 2015-05-25 NOTE — Evaluation (Signed)
Occupational Therapy Assessment and Plan  Patient Details  Name: Jamie [name] MRN: [id number] Date of Birth: 1956/08/14  OT Diagnosis: acute pain, blindness and low vision, lumbago (low back pain), muscle weakness (generalized) and pain in joint Rehab Potential: Rehab Potential (ACUTE ONLY): Fair ELOS: 3 weeks   Today's Date: 05/25/2015 OT Individual Time: 1517-6160 OT Individual Time Calculation (min): 65 min     Problem List:  Patient Active Problem List   Diagnosis Date Noted  . 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 Neurogenic bowel 05/24/2015  . 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 Hypotonic neurogenic bladder   . Leg weakness   . Sacral fracture, closed 05/21/2015  . 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 UTI (lower urinary tract infection) 05/21/2015  . Urinary retention 05/21/2015    Past Medical History:  Past Medical History  Diagnosis Date  . 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 Progressive gait disorder     Was treated for probable MS until diagnosed with Neurosarcoidosis about 10 years ago.    Past Surgical History: No past surgical history on file.  Assessment & Plan Clinical Impression: Patient is a 59 y.o. year old female with h/oNeurosarcoidosis with diffuse peripheral neuropathy, progressive gait disorder and significant visual impairment; who sustained a fall on 05/09/15 during process of moving to [place]. Since that episode she has had multiple falls with bladder and bowel incontinence and difficulty walking. She was evaluated in ED on 5/26 with negative work up. She was re-admitted on 05/20/15 recurrent fall, HA and sacral pain. CT head revealed advanced centralized atrophy with ex vacuo dilation of ventricular system and was negative for acute changes. MRI Lumbar spine showed sacral fracture S1-S2 with mild angulation and some edema and hemorrhage  in the area. No surgical intervention needed per Dr. Kathyrn Sheriff. Thoracic spine MRI demonstrated a small T10 superior endplate compression which appeared acute. Cervical spine MRI showed no compressive lesions no evidence of fractures. Dr. Lorin Mercy consulted and advised conservative care with TLSO for support. Neurology consulted for instability of gait with workup felt to be suspect nonfocal concussion like trauma to the spinal cord as no compressive lesions noted. She has had issues with neuropathy BLE as well as urinary retention requiring foley placement. She was started on rocephin for E coli UTI. She failed voiding trial on 06/06 therefore foley was replaced. She has had improvement in BLE strength but continue to be limited by significant sacral pain.  Patient transferred to CIR on 05/24/2015 .    Patient currently requires total with basic self-care skills secondary to muscle weakness, impaired timing and sequencing, unbalanced muscle activation and decreased coordination, progressive blindness and decreased sitting balance, decreased standing balance and decreased balance strategies.  Prior to hospitalization, patient could complete ADLs with modified independent .  Patient will benefit from skilled intervention to decrease level of assist with basic self-care skills prior to discharge recommend home with care partner.  Anticipate patient will require 24 hour supervision and minimal physical assistance and follow up home health.  OT - End of Session Activity Tolerance: Tolerates 30+ min activity with multiple rests Endurance Deficit: Yes (activity limited by pain) OT Assessment Rehab Potential (ACUTE ONLY): Fair Barriers to Discharge: Decreased caregiver support OT Patient demonstrates impairments in the following area(s): Balance;Endurance;Motor;Pain;Safety OT Basic ADL's Functional Problem(s): Eating;Grooming;Bathing;Dressing;Toileting OT Transfers Functional Problem(s): Toilet;Tub/Shower OT  Plan OT Intensity:  Minimum of 1-2 x/day, 45 to 90 minutes OT Frequency: 5 out of 7 days OT Duration/Estimated Length of Stay: 3 weeks OT Treatment/Interventions: Balance/vestibular training;Cognitive remediation/compensation;Discharge planning;DME/adaptive equipment instruction;Functional mobility training;Neuromuscular re-education;Pain management;Patient/family education;Psychosocial support;Self Care/advanced ADL retraining;Splinting/orthotics;Therapeutic Activities;Skin care/wound managment;Therapeutic Exercise;UE/LE Strength taining/ROM;UE/LE Coordination activities OT Self Feeding Anticipated Outcome(s): supervision/setup OT Basic Self-Care Anticipated Outcome(s): Min assist OT Toileting Anticipated Outcome(s): Min assist OT Bathroom Transfers Anticipated Outcome(s): Supervision OT Recommendation Recommendations for Other Services: Neuropsych consult Patient destination: Home Follow Up Recommendations: Home health OT;Skilled nursing facility;24 hour supervision/assistance Equipment Recommended: To be determined   Skilled Therapeutic Intervention OT eval completed with discussion of OT purpose, goals, and POC.  ADL assessment completed at bed level with plan to progress to EOB but out of time due to incontinent of BM.  Pt able to roll Rt and Lt in bed with use of bed rails and supervision but unable to roll without use of bed rails.  LB bathing and dressing completed at bed level with head of bed elevated to assist pt in reaching legs to assist in washing with pt unable to due to pain in sacrum and back with positioning.  Pt able to lift both feet to assist therapist with threading pants and then rolled to Rt and Lt while therapist pulled pants over hips.  Incontinent of bowel with no awareness requiring total assist to clean.   OT Evaluation Precautions/Restrictions  Precautions Precautions: Fall;Back Other Brace/Splint: TLSO ordered and in room, but after speaking with MD and PA report  use PRN for pain as fx's are "stable" Restrictions Weight Bearing Restrictions: No Pain Pain Assessment Pain Assessment: 0-10 Pain Score: 9  Pain Type: Acute pain Pain Location: Back Pain Orientation: Lower Pain Descriptors / Indicators: Aching Pain Frequency: Intermittent Pain Onset: On-going Patients Stated Pain Goal: 4 Pain Intervention(s): Medication (See eMAR) Home Living/Prior Functioning Home Living Available Help at Discharge: Personal care attendant, Available PRN/intermittently (has 3 hours/day hired caregiver) Type of Home: Apartment Home Access: Level entry, Elevator (on 2nd floor) Home Layout: One level Additional Comments: pt's elderly but very active and independent parents live in [place] but are planning a move to [place] in the fall of this year  Lives With: Alone IADL History Homemaking Responsibilities: No Current License: No Prior Function Level of Independence: Requires assistive device for independence, Needs assistance with homemaking Driving: No Vocation: On disability ADL  See FIM Vision/Perception  Vision- History Baseline Vision/History: Legally blind  Cognition Overall Cognitive Status: Within Functional Limits for tasks assessed Orientation Level: Person;Place;Situation Person: Oriented Place: Oriented Situation: Oriented Year: 2016 Month: June Day of Week: Correct Immediate Memory Recall: Sock;Blue;Bed Memory Recall: Sock;Blue;Bed Memory Recall Sock: Without Cue Memory Recall Blue: Without Cue Memory Recall Bed: Without Cue Attention: Selective Selective Attention: Appears intact Safety/Judgment: Appears intact Sensation Sensation Light Touch: Appears Intact (in BUE) Stereognosis: Not tested Hot/Cold: Not tested Proprioception: Not tested Coordination Fine Motor Movements are Fluid and Coordinated: Yes Extremity/Trunk Assessment RUE Assessment RUE Assessment: Exceptions to [facility] RUE AROM (degrees) RUE Overall AROM  Comments: shoulder flexion grossly 100 degrees before reports of pain; elbow, wrist, and fingers WFL RUE PROM (degrees) RUE Overall PROM Comments: shoulder flexion grossly 120 degrees before reports of pain RUE Strength RUE Overall Strength Comments: strength grossly 4+/5 at bicep/tricep LUE Assessment LUE Assessment: Exceptions to [facility] LUE AROM (degrees) LUE Overall AROM Comments: shoulder flexion approx 100 degrees before reports of pain; elbow, wrist, and fingers WFL LUE Strength LUE Overall Strength Comments: strength grossly 4+/5  at bicep/tricep  FIM:  FIM - Grooming Grooming Steps: Wash, rinse, dry face;Wash, rinse, dry hands;Oral care, brush teeth, clean dentures Grooming: 5: Set-up assist to apply toothpaste FIM - Bathing Bathing Steps Patient Completed: Chest;Right Arm;Left Arm;Abdomen Bathing: 2: Max-Patient completes 3-4 60f10 parts or 25-49% FIM - Upper Body Dressing/Undressing Upper body dressing/undressing: 4: Steadying assist FIM - Lower Body Dressing/Undressing Lower body dressing/undressing: 1: Total-Patient completed less than 25% of tasks FIM - Toileting Toileting: 0: No continent bowel/bladder events this shift   Refer to Care Plan for Long Term Goals  Recommendations for other services: Neuropsych  Discharge Criteria: Patient will be discharged from OT if patient refuses treatment 3 consecutive times without medical reason, if treatment goals not met, if there is a change in medical status, if patient makes no progress towards goals or if patient is discharged from hospital.  The above assessment, treatment plan, treatment alternatives and goals were discussed and mutually agreed upon: by patient  HSimonne Come6/07/2015, 12:41 PM

## 2015-05-25 NOTE — Progress Notes (Signed)
Contacted Dr. Kathyrn Sheriff for input on brace as this is causing additional discomfort. He evaluated films and indicates that brace if more for pain control as thoracic fracture is not unstable. Brace can be used on prn basis if is causing limitation

## 2015-05-25 NOTE — Progress Notes (Signed)
Jamie Blake, MD Physician Signed Physical Medicine and Rehabilitation Consult Note 05/23/2015 7:41 AM  Related encounter: ED to Hosp-Admission (Discharged) from 05/20/2015 in [place] Collapse All        Physical Medicine and Rehabilitation Consult Reason for Consult: Sacral fracture with history of sarcoidosis Referring Physician: [facility]    HPI: Jamie [name] is a 59 y.o. right handed female with history of legally blind, sarcoidosis. Patient originally from [place] moved to the [place] area approximately 1 week ago he closer to her family. Her parents are elderly and lives in [place] has lived alone independently with occasional assistive device prior to admission and had a hired caregiver couple hours a day to assist with ADLs.Since moving to [facility] she fell on 05/12/2015 with complaints of low back pain.In ED cranial CT scan after a fall 5/26 showed prominence of the fourth ventricle X-rays showed no evidence of fracture. Presented 05/20/2015 with recent fall onto her buttocks as well as numerous falls in the past.. On one of her latest fall she fell struck her head on the toilet. Noted complaints of dull headaches. Complains of sacral pain and numbness as well as bouts of incontinence of bowel bladder. . Repeat cranial CT scan 05/20/2015 shows microvascular ischemic disease without definite acute intracranial process. MRI lumbar spine showed sacral fracture S1-S2 with mild angulation, Thoracic spine MRI demonstrated a small T10 superior endplate compression which appeared acute. Cervical spine MRI showed no compressive lesions no evidence of fractures. Orthopedic services Dr. Lorin Mercy consulted advise conservative care.TLSO ordered Neurology consulted for instability of gait with workup felt to be suspect nonfocal concussion and again advise conservative care. Flomax has been added for urinary retention. Occupational  therapy evaluation completed 05/22/2015 with recommendations of physical medicine rehabilitation consult.  Patient complains of numbness on the bottom of the feet as well as difficulty urinating and poor sensation for bowel movements  Review of Systems  Constitutional: Negative for fever and chills.  Eyes:   Patient is legally blind  Respiratory: Negative for stridor.  Cardiovascular: Positive for leg swelling.  Gastrointestinal: Positive for nausea.   Bowel incontinence  Genitourinary:   Urinary incontinence  Musculoskeletal: Positive for back pain, joint pain and falls.  Neurological: Positive for sensory change, weakness and headaches.  Psychiatric/Behavioral: Positive for depression.   Past Medical History  Diagnosis Date  . 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 Sarcoidosis    History reviewed. No pertinent past surgical history. Family History  Problem Relation Age of Onset  . Hypertension Father    Social History:  reports that she has never smoked. She does not have any smokeless tobacco history on file. She reports that she does not drink alcohol or use illicit drugs. Allergies:  Allergies  Allergen Reactions  . Penicillins Rash   Medications Prior to Admission  Medication Sig Dispense Refill  . FLUoxetine (PROZAC) 40 MG capsule Take 40 mg by mouth daily.    . furosemide (LASIX) 40 MG tablet Take 40 mg by mouth daily as needed for fluid or edema.     Marland Kitchen levothyroxine (SYNTHROID, LEVOTHROID) 100 MCG tablet Take 100 mcg by mouth daily.    . polyethylene glycol powder (MIRALAX) powder Take 17 g by mouth daily. 255 g 0  . potassium chloride (K-DUR) 10 MEQ tablet Take 10 mEq by mouth daily.      Home: Home Living Family/patient expects to be discharged to:: Private residence Living Arrangements: Alone (  per neuro note-pt had an "aide" ) Type of Home: Apartment Home Access: Level entry Home  Layout: One level Home Equipment: Bedside commode, Grab bars - tub/shower, Walker - 2 wheels, Shower seat, Cane - single point Additional Comments: pt's elderly but very active and independent parents present for eval and answered most questions, gave hx; They live in [place] but are planning a move to [place] in the fall of this year;  Functional History: Prior Function Level of Independence: Independent with assistive device(s) Comments: per parent's report Functional Status:  Mobility: Bed Mobility Overal bed mobility: Needs Assistance Bed Mobility: Rolling, Sidelying to Sit Rolling: +2 for physical assistance, Max assist, Total assist (max to R, Total to L) Sidelying to sit: +2 for physical assistance, Total assist General bed mobility comments: pt +0%; multi-modal cues for  Transfers General transfer comment: unable to test due to pain      ADL: ADL Overall ADL's : Needs assistance/impaired Eating/Feeding: Total assistance Grooming: Maximal assistance Upper Body Dressing : Total assistance, Sitting Lower Body Dressing: Total assistance, Bed level Functional mobility during ADLs: Total assistance, +2 for physical assistance, +2 for safety/equipment (bed mobility, supine to sit only) General ADL Comments: Patient is blind. Has had to have someone feed her in the hospital. Basically total care at this time. She reports, and her parents confirm, that she was independent and living alone previously. Worked on bed mobility. Max A +2 roll to right, total A +2 supine to sit. Sat EOB total A initially, with brief period of min guard A. Total A +2 sit to supine. Total A +2 roll to left. Dependent to change gowns and change bed pad. Positioned comfortably in bed at end of session. Pain limits her.   Cognition: Cognition Overall Cognitive Status: Difficult to assess Cognition Arousal/Alertness: Lethargic Behavior During Therapy: Flat affect Overall Cognitive Status: Difficult  to assess Area of Impairment: Following commands, Problem solving Following Commands: Follows one step commands with increased time Problem Solving: Slow processing, Decreased initiation, Difficulty sequencing, Requires verbal cues, Requires tactile cues  Blood pressure 113/55, pulse 80, temperature 98.5 F (36.9 C), temperature source Oral, resp. rate 16, height 5' 4.5" (1.638 m), weight 54.432 kg (120 lb), SpO2 100 %. Physical Exam  Constitutional: She is oriented to person, place, and time. She appears well-developed.  HENT:  Head: Normocephalic.  Eyes:  Patient can see some shadows  Neck: Normal range of motion. Neck supple. No thyromegaly present.  Cardiovascular: Normal rate and regular rhythm.  Respiratory: Effort normal and breath sounds normal. No respiratory distress.  GI: Soft. Bowel sounds are normal. She exhibits no distension.  Neurological: She is alert and oriented to person, place, and time.  Skin: Skin is warm and dry.  Reduced sensation on the plantar surface of both feet intact sensation on the dorsal surface Motor strength is 4+ bilateral deltoid, biceps, triceps, grip 3 minus bilateral hip flexor and knee extensor 4 at the ankle dorsiflexor and plantar flexor Tenderness to palpation along the lower thoracic paraspinal and spinous process area as well as along the sacrum   Lab Results Last 24 Hours    No results found for this or any previous visit (from the past 24 hour(s)).    Imaging Results (Last 48 hours)    Mr Thoracic Spine W Wo Contrast  05/21/2015 CLINICAL DATA: [place]. Back pain. EXAM: MRI THORACIC SPINE WITHOUT AND WITH CONTRAST TECHNIQUE: Multiplanar and multiecho pulse sequences of the thoracic spine were obtained without and with intravenous  contrast. CONTRAST: 71mL MULTIHANCE GADOBENATE DIMEGLUMINE 529 MG/ML IV SOLN COMPARISON: None. FINDINGS: Normal alignment of the thoracic vertebral bodies. There is a mild superior endplate fracture of the  T10 vertebral body. No retropulsion or canal compromise. The other thoracic vertebral bodies are normal. The thoracic spinal cord is normal. No cord lesions or syrinx. The facets are normally aligned. No foraminal lesions. No thoracic disc protrusions or canal stenosis. IMPRESSION: Mild superior endplate fracture of I78 without retropulsion or canal compromise. Electronically Signed By: Marijo Sanes M.D. On: 05/21/2015 13:49     Assessment/Plan: Diagnosis: Sacral fracture with sacral nerve root injury causing neurogenic bowel and bladder 1. Does the need for close, 24 hr/day medical supervision in concert with the patient's rehab needs make it unreasonable for this patient to be served in a less intensive setting? Yes 2. Co-Morbidities requiring supervision/potential complications: Patient needs retraining of neurogenic bowel and bladder, also has T10 compression fracture, neurogenic pain in the lower extremities as well as musculoskeletal pain which inhibits motion 3. Due to bladder management, bowel management, safety, skin/wound care, disease management, medication administration, pain management and patient education, does the patient require 24 hr/day rehab nursing? Yes 4. Does the patient require coordinated care of a physician, rehab nurse, PT (1-2 hrs/day, 5 days/week) and OT (11-2 hrs/day, 5 days/week) to address physical and functional deficits in the context of the above medical diagnosis(es)? Yes Addressing deficits in the following areas: balance, endurance, locomotion, strength, transferring, bowel/bladder control, bathing, dressing, feeding, grooming and toileting 5. Can the patient actively participate in an intensive therapy program of at least 3 hrs of therapy per day at least 5 days per week? Yes and Potentially 6. The potential for patient to make measurable gains while on inpatient rehab is good 7. Anticipated functional outcomes upon discharge from inpatient rehab are min  assist with PT, min assist with OT, n/a with SLP. 8. Estimated rehab length of stay to reach the above functional goals is: 16-21 days 9. Does the patient have adequate social supports and living environment to accommodate these discharge functional goals? Potentially 10. Anticipated D/C setting: Home 11. Anticipated post D/C treatments: [place] therapy 12. Overall Rehab/Functional Prognosis: good  RECOMMENDATIONS: This patient's condition is appropriate for continued rehabilitative care in the following setting: CIR Patient has agreed to participate in recommended program. Yes Note that insurance prior authorization may be required for reimbursement for recommended care.  Comment: Need to clarify how much caregiver Time is available post discharge    05/23/2015       Revision History     Date/Time User Provider Type Action   05/23/2015 4:01 PM Jamie Blake, MD Physician Sign   05/23/2015 9:00 AM Cathlyn Parsons, PA-C Physician Assistant Pend   View Details Report       Routing History     Date/Time From To Method   05/23/2015 4:01 PM Jamie Blake, MD Jamie Blake, MD In Basket   05/23/2015 4:01 PM Jamie Blake, MD Olga Millers, MD In [facility]

## 2015-05-25 NOTE — Progress Notes (Deleted)
Contacted Dr. Kathyrn Sheriff for input on brace as this is causing additional discomfort.  He evaluated films and indicates that brace if more for pain control as thoracic fracture is not unstable. Brace can be used on prn basis if is causing limitation.

## 2015-05-25 NOTE — Care Management Note (Signed)
Inpatient [facility] Individual Statement of Services  Patient Name:  Jamie [name]  Date:  05/25/2015  Welcome to the [facility].  Our goal is to provide you with an individualized program based on your diagnosis and situation, designed to meet your specific needs.  With this comprehensive rehabilitation program, you will be expected to participate in at least 3 hours of rehabilitation therapies Monday-Friday, with modified therapy programming on the weekends.  Your rehabilitation program will include the following services:  Physical Therapy (PT), Occupational Therapy (OT), 24 hour per day rehabilitation nursing, Therapeutic Recreaction (TR), Neuropsychology, Case Management (Social Worker), Rehabilitation Medicine, Nutrition Services and Pharmacy Services  Weekly team conferences will be held on Wednesday to discuss your progress.  Your Social Worker will talk with you frequently to get your input and to update you on team discussions.  Team conferences with you and your family in attendance may also be held.  Expected length of stay: 3 weeks  Overall anticipated outcome: min level  Depending on your progress and recovery, your program may change. Your Social Worker will coordinate services and will keep you informed of any changes. Your Social Worker's name and contact numbers are listed  below.  The following services may also be recommended but are not provided by the [facility]:    [place] will be made to provide these services after discharge if needed.  Arrangements include referral to agencies that provide these services.  Your insurance has been verified to be:  Medicare A & B Your primary doctor is:  Wallace Cullens pt with has not seen yet  Pertinent information will be shared with your doctor and your insurance company.  Social Worker:   Ovidio Kin, [place] or ([phone]  Information discussed with and copy given to patient by: [name], 05/25/2015, 9:31 AM

## 2015-05-26 ENCOUNTER — Inpatient Hospital Stay (HOSPITAL_COMMUNITY): Payer: Medicare Other | Admitting: Physical Therapy

## 2015-05-26 ENCOUNTER — Inpatient Hospital Stay (HOSPITAL_COMMUNITY): Payer: Medicare Other | Admitting: Occupational Therapy

## 2015-05-26 DIAGNOSIS — D8689 Sarcoidosis of other sites: Secondary | ICD-10-CM

## 2015-05-26 DIAGNOSIS — S3210XS Unspecified fracture of sacrum, sequela: Secondary | ICD-10-CM

## 2015-05-26 MED ORDER — GABAPENTIN 100 MG PO CAPS
100.0000 mg | ORAL_CAPSULE | Freq: Three times a day (TID) | ORAL | Status: DC
  Administered 2015-05-26 – 2015-06-10 (×44): 100 mg via ORAL
  Filled 2015-05-26 (×49): qty 1

## 2015-05-26 NOTE — Progress Notes (Addendum)
Subjective/Complaints: Chief complaint of sacral pain as well as issue of pain 59 year old female with fall 523, eventual workup revealed sacral fracture S1-S2 in angulation and compression of sacral nerve roots. In addition there was a T10 superior endplate compression fracture.  She continues have problems with her bladder requiring intermittent catheterizations, She is also having problem with bowel incontinence and decreased sensation for bowel movements  She is taking narcotic analgesic pain medications  Objective: Vital Signs: Blood pressure 106/71, pulse 85, temperature 97.4 F (36.3 C), temperature source Oral, resp. rate 17, height 5' 4"  (1.626 m), weight 57.9 kg (127 lb 10.3 oz), SpO2 100 %. No results found. Results for orders placed or performed during the hospital encounter of 05/24/15 (from the past 72 hour(s))  CBC WITH DIFFERENTIAL     Status: Abnormal   Collection Time: 05/25/15  7:57 AM  Result Value Ref Range   WBC 6.4 4.0 - 10.5 K/uL   RBC 3.73 (L) 3.87 - 5.11 MIL/uL   Hemoglobin 10.6 (L) 12.0 - 15.0 g/dL   HCT 30.7 (L) 36.0 - 46.0 %   MCV 82.3 78.0 - 100.0 fL   MCH 28.4 26.0 - 34.0 pg   MCHC 34.5 30.0 - 36.0 g/dL   RDW 15.0 11.5 - 15.5 %   Platelets 337 150 - 400 K/uL   Neutrophils Relative % 58 43 - 77 %   Neutro Abs 3.8 1.7 - 7.7 K/uL   Lymphocytes Relative 27 12 - 46 %   Lymphs Abs 1.7 0.7 - 4.0 K/uL   Monocytes Relative 5 3 - 12 %   Monocytes Absolute 0.3 0.1 - 1.0 K/uL   Eosinophils Relative 8 (H) 0 - 5 %   Eosinophils Absolute 0.5 0.0 - 0.7 K/uL   Basophils Relative 2 (H) 0 - 1 %   Basophils Absolute 0.1 0.0 - 0.1 K/uL  Comprehensive metabolic panel     Status: Abnormal   Collection Time: 05/25/15  7:57 AM  Result Value Ref Range   Sodium 140 135 - 145 mmol/L   Potassium 4.0 3.5 - 5.1 mmol/L   Chloride 104 101 - 111 mmol/L   CO2 26 22 - 32 mmol/L   Glucose, Bld 83 65 - 99 mg/dL   BUN 13 6 - 20 mg/dL   Creatinine, Ser 0.51 0.44 - 1.00 mg/dL    Calcium 8.9 8.9 - 10.3 mg/dL   Total Protein 6.0 (L) 6.5 - 8.1 g/dL   Albumin 2.7 (L) 3.5 - 5.0 g/dL   AST 21 15 - 41 U/L   ALT 15 14 - 54 U/L   Alkaline Phosphatase 218 (H) 38 - 126 U/L   Total Bilirubin 0.3 0.3 - 1.2 mg/dL   GFR calc non Af Amer >60 >60 mL/min   GFR calc Af Amer >60 >60 mL/min    Comment: (NOTE) The eGFR has been calculated using the CKD EPI equation. This calculation has not been validated in all clinical situations. eGFR's persistently <60 mL/min signify possible Chronic Kidney Disease.    Anion gap 10 5 - 15     HEENT: normal Cardio: RRR and no murmur Resp: CTA B/L and unlabored GI: BS positive and nontender nondistended Extremity:  No Edema Skin:   Intact Neuro: Alert/Oriented, Abnormal Sensory reduced sensation in the  sacral dermatomes and Abnormal Motor decreased toe flexion and extension,  normal ankle plantar flexion dorsiflexion as well as hip flexion and knee extension Musc/Skel:  LB tender Gen. no acute distress  Assessment/Plan: 1. 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 Physiatrist is providing close team supervision and 24 hour management of active medical problems listed below. Physiatrist and rehab team continue to assess barriers to discharge/monitor patient progress toward functional and medical goals. FIM: FIM - Bathing Bathing Steps Patient Completed: Chest, Right Arm, Left Arm, Abdomen, Right upper leg, Left upper leg Bathing: 3: Mod-Patient completes 5-7 44f10 parts or 50-74%  FIM - Upper Body Dressing/Undressing Upper body dressing/undressing steps patient completed: Thread/unthread right bra strap, Thread/unthread left bra strap, Hook/unhook bra, Thread/unthread right sleeve of pullover  shirt/dresss, Thread/unthread left sleeve of pullover shirt/dress, Put head through opening of pull over shirt/dress, Pull shirt over trunk Upper body dressing/undressing: 4: Steadying assist FIM - Lower Body Dressing/Undressing Lower body dressing/undressing: 1: Total-Patient completed less than 25% of tasks  FIM - Toileting Toileting steps completed by patient: Adjust clothing prior to toileting Toileting Assistive Devices: Grab bar or rail for support Toileting: 2: Max-Patient completed 1 of 3 steps  FIM - TRadio producerDevices: Grab bars Toilet Transfers: 4-To toilet/BSC: Min A (steadying Pt. > 75%)  FIM - Bed/Chair Transfer Bed/Chair Transfer Assistive Devices: Arm rests Bed/Chair Transfer: 4: Supine > Sit: Min A (steadying Pt. > 75%/lift 1 leg), 4: Bed > Chair or W/C: Min A (steadying Pt. > 75%), 4: Chair or W/C > Bed: Min A (steadying Pt. > 75%)  FIM - Locomotion: Wheelchair Locomotion: Wheelchair: 1: Total Assistance/staff pushes wheelchair (Pt<25%) FIM - Locomotion: Ambulation Locomotion: Ambulation Assistive Devices: WAdministratorAmbulation/Gait Assistance: 1: +2 Total assist, 4: Min assist Locomotion: Ambulation: 1: Two helpers  Comprehension Comprehension Mode: Auditory Comprehension: 5-Understands complex 90% of the time/Cues < 10% of the time  Expression Expression Mode: Verbal Expression: 5-Expresses complex 90% of the time/cues < 10% of the time  Social Interaction Social Interaction: 5-Interacts appropriately 90% of the time - Needs monitoring or encouragement for participation or interaction.  Problem Solving Problem Solving: 4-Solves basic 75 - 89% of the time/requires cueing 10 - 24% of the time  Memory Memory: 6-More than reasonable amt of time  Medical Problem List and Plan: 1. 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  2. DVT  Prophylaxis/Anticoagulation: Pharmaceutical: Lovenox 3. Pain Management: Will resume Oxycodone as hydrocodone ineffective. Will add lidocaine patch also.  4. Mood: Continue prozac.  5. Neuropsych: This patient is capable of making decisions on her own behalf. 6. Skin/Wound Care: Routine pressure relief measure. Will order air mattress overlay to help with pressure relief as well as pain.  7. Fluids/Electrolytes/Nutrition: Monitor I/O. Check lytes in am.  8. Urinary retention: Continue foley till pain and mobility improves then attempt another voiding trial. Continue flomax.  9. E. Coli UTI: Antibiotic D 6/7.Rocephin changed to Vantin today. 10. Constipation: continue Miralax bid with suppository daily. 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   LOS (Days) 2 A FACE TO FACE EVALUATION WAS PERFORMED  KIRSTEINS,ANDREW E 05/26/2015, 2:13 PM

## 2015-05-26 NOTE — Progress Notes (Signed)
Occupational Therapy Session Note  Patient Details  Name: Jamie [name] MRN: [id number] Date of Birth: 06-Nov-1956  Today's Date: 05/26/2015 OT Individual Time: 0845-1000 OT Individual Time Calculation (min): 75 min    Short Term Goals: Week 1:  OT Short Term Goal 1 (Week 1): Pt will complete bathing at mod assist level from EOB OT Short Term Goal 2 (Week 1): Pt will complete LB dressing at sit > stand level with max assist OT Short Term Goal 3 (Week 1): Pt will complete toilet transfer with RW with min assist OT Short Term Goal 4 (Week 1): Pt will complete shower transfer with RW with mod assist  Skilled Therapeutic Interventions/Progress Updates:    Engaged in ADL retraining with focus on sitting tolerance, sitting balance, and UB strengthening.  Pt in bed upon arrival, engaged in perineal hygiene at bed level with pt rolling Rt and Lt with supervision with use of bed rails.  Pt incontinent of bowel, unaware, when questioned reports having suppository sometime before breakfast (RN reports 6 AM).  Bathing and dressing completed seated at EOB with trash can on side to provide support under BLE in sitting as bed too high, requested different air mattress bed from RN who planned to ask PA.  Provided steady assist with sitting balance at EOB due to air mattress and not optimal BLE support with pt able to complete bathing before requiring rest break due to pain in sacrum.  Pt tolerated sitting approx 9 minutes before 4-5 min rest break in bed.  Returned to sitting at EOB and completed UB dressing with setup assist and LB dressing with total due to fear of falling from high surface.  Sit > stand with min assist with use of RW for UE support in standing while therapist pulled pants over hips.  Stand step transfer to w/c with use of RW with min assist.  Completed grooming tasks seated in w/c at sink with setup assist.  In therapy gym, engaged in UE arm bike with pt completing 2 mins forward and 2 mins backward  on resistance level 2.0 for endurance and BUE strengthening.  Pt left tilted back in tilt-in-space w/c and encouraged to sit up for 30 mins if possible.  Therapy Documentation Precautions:  Precautions Precautions: Fall, Back Required Braces or Orthoses: Spinal Brace Spinal Brace: Thoracolumbosacral orthotic Other Brace/Splint: Ordered PRN Restrictions Weight Bearing Restrictions: No Pain: Pain Assessment Pain Assessment: 0-10 Pain Score: 7  Pain Type: Acute pain Pain Location: Sacrum Pain Orientation: Lower Pain Radiating Towards: lower back Pain Descriptors / Indicators: Aching Pain Frequency: Constant Pain Onset: On-going Patients Stated Pain Goal: 4 Pain Intervention(s): Medication (See eMAR)  See FIM for current functional status  Therapy/Group: Individual Therapy  Simonne Come 05/26/2015, 12:22 PM

## 2015-05-26 NOTE — Progress Notes (Signed)
Physical Therapy Session Note  Patient Details  Name: Jamie [name] MRN: [id number] Date of Birth: Aug 17, 1956  Today's Date: 05/26/2015 PT Individual Time:  - 1300-1400 Treatment Time: 60 min     Short Term Goals: Week 1:  PT Short Term Goal 1 (Week 1): Pt will perform bed mobility (R/L rolling, scooting to [facility]) with supervision and min cues PT Short Term Goal 2 (Week 1): Pt will perform bed <-> w/c transfer with supervision and min cues for positioning/placement due to visual impairments PT Short Term Goal 3 (Week 1): Pt will perform ambulation with RW x75 ft with min guard and min cues for navigation PT Short Term Goal 4 (Week 1): Pt will perform ascent/descent of 6 3-inch stair with minA and min verbal cues PT Short Term Goal 5 (Week 1): Pt will improve sitting tolerance in tilt-in-space w/c to 30 min in reclined position  Skilled Therapeutic Interventions/Progress Updates:    Therapeutic Activity: Pt received in partial R side lie on air mattress, agreeable to PT. PT instructs pt in rolling L in bed (apprehensive due to poor vision) in flat bed without rail req min A, then L side lie to/from sit transfer req min A, then sit to stand from EOB with w/c armrest req min A, then stand-step transfer with w/c armrest for support (and PT) req min A to/from w/c.   W/C Management: Pt c/o discomfort sitting on w/c cushion. PT exchanges Varilite cushion for a Roho air cell cushion.   Gait Training: PT instructs pt in ambualtion with L wall rail and R HHA req min-mod A for balance - slow gait, shuffle steps for tactile feedback to pt due to low vision.  PT instructs pt in ambulation with RW x 60' req min A from PT and 2nd assist for w/c follow for safety.   Neuromuscular Reeducation: PT instructs pt in TUG with RW and pt scores: 132 seconds and min A.   Pt is slowly increasing out of bed activity. PT is trialing Roho cushion to see if this improves pt sitting/oob tolerance. Continue per PT POC.     Therapy Documentation Precautions:  Precautions Precautions: Fall, Back Required Braces or Orthoses: Spinal Brace Spinal Brace: Thoracolumbosacral orthotic Other Brace/Splint: Ordered PRN Restrictions Weight Bearing Restrictions: No Pain: Pain Assessment Pain Assessment: 0-10 Pain Score: 7  Pain Type: Acute pain Pain Location: Sacrum Pain Orientation: Lower Pain Radiating Towards: lower back Pain Descriptors / Indicators: Aching Pain Frequency: Constant Pain Onset: On-going Patients Stated Pain Goal: 4 Pain Intervention(s): Rest Multiple Pain Sites: No  Balance: Balance Balance Assessed: Yes Standardized Balance Assessment Standardized Balance Assessment: Timed Up and Go Test Timed Up and Go Test TUG: Normal TUG Normal TUG (seconds): 132  See FIM for current functional status  Therapy/Group: Individual Therapy  [name] M 05/26/2015, 8:54 AM

## 2015-05-26 NOTE — Progress Notes (Signed)
Physical Therapy Session Note  Patient Details  Name: Jamie [name] MRN: [id number] Date of Birth: [birth year]  Today's Date: 05/26/2015 PT Individual Time: 1300-1400 PT Individual Time Calculation (min): 60 min   Short Term Goals: Week 1:  PT Short Term Goal 1 (Week 1): Pt will perform bed mobility (R/L rolling, scooting to [facility]) with supervision and min cues PT Short Term Goal 2 (Week 1): Pt will perform bed <-> w/c transfer with supervision and min cues for positioning/placement due to visual impairments PT Short Term Goal 3 (Week 1): Pt will perform ambulation with RW x75 ft with min guard and min cues for navigation PT Short Term Goal 4 (Week 1): Pt will perform ascent/descent of 6 3-inch stair with minA and min verbal cues PT Short Term Goal 5 (Week 1): Pt will improve sitting tolerance in tilt-in-space w/c to 30 min in reclined position  Skilled Therapeutic Interventions/Progress Updates:    Pt receive supine in bed with LE elevated; reports pain 7/10 in sacrum as noted below, otherwise agreeable to treatment. Pt instructed in bed mobility and supine -> sit with modA and bedrails. Pt instructed in gait training using RW and minA x40 ft. Therapist provided manual facilitation at hips to provide weight shifting and pt cued to increase step length. In therapy gym pt instructed in gait training activities including BLE toe taps onto 1" step, as well as BLE step-ups to step; performed to facilitate weight shifting, knee control, and dynamic single LE balance for carryover into functional gait tasks. Pt requires frequent sitting rest breaks due to discomfort in sacral region. Wheelchair cushion changed to provide increased support/stability and pt reports increased comfort as well as ability to tolerate reduction in tilt to more upright position. Pt performed stand step transfer w/c <-> toilet to perform hygiene following incontinent bowel movement. Pt performs doffing pants with supervision due to  balance impairments; all personal hygiene and donning pants required totalA due to poor balance, UE coordination and decreased perineal sensation. Pt returned to bed sit -> supine with minA/CGa, however required maxA for scooting to [facility]. All needs left within reach.  Therapy Documentation Precautions:  Precautions Precautions: Fall, Back Required Braces or Orthoses: Spinal Brace Spinal Brace: Thoracolumbosacral orthotic Other Brace/Splint: Ordered PRN Restrictions Weight Bearing Restrictions: No Pain: Pain Assessment Pain Assessment: 0-10 Pain Score: 7  Pain Type: Acute pain Pain Location: Sacrum Pain Orientation: Lower Pain Radiating Towards: lower back Pain Descriptors / Indicators: Aching Pain Frequency: Constant Pain Onset: On-going Patients Stated Pain Goal: 4 Pain Intervention(s): Repositioned;Rest Multiple Pain Sites: No   See FIM for current functional status  Therapy/Group: Individual Therapy  Luberta Mutter 05/26/2015, 2:24 PM

## 2015-05-26 NOTE — IPOC Note (Addendum)
Overall Plan of Care [facility]) Patient Details Name: Jamie [name] MRN: [id number] DOB: 08/18/1956  Admitting Diagnosis: sacral fracture with neuroenic bowel and bladder  Hospital Problems: Principal Problem:   Sacral nerve root injury Active Problems:   Sacral fracture, closed   Neurosarcoidosis   Neurogenic bowel   Neurogenic bladder, flaccid   Hypotonic neurogenic bladder     Functional Problem List: Nursing Bladder, Bowel, Safety, Sensory, Endurance, Medication Management, Motor, Pain, Perception  PT Balance, Safety, Sensory, Endurance, Motor, Pain  OT Balance, Endurance, Motor, Pain, Safety  SLP    TR Activity tolerance, functional mobility, balance, safety, pain,          Basic ADL's: OT Eating, Grooming, Bathing, Dressing, Toileting     Advanced  ADL's: OT       Transfers: PT Bed Mobility, Bed to Chair, Car, Manufacturing systems engineer, Metallurgist: PT Ambulation, Emergency planning/management officer, Stairs     Additional Impairments: OT    SLP        TR  community reintegration    Anticipated Outcomes Item Anticipated Outcome  Self Feeding supervision/setup  Swallowing      Basic self-care  Min assist  Energy manager Transfers Supervision  Bowel/Bladder  manage bladder with total assist and bowel with level 5 assist  Transfers  supervision overall  Locomotion  supervision overall, min/mod for w/c mobility d/t visual impairments  Communication     Cognition     Pain  Pain managed with prn med at or below level 8  Safety/Judgment  maintain safety with cues   Therapy Plan: PT Intensity: Minimum of 1-2 x/day ,45 to 90 minutes PT Frequency: 5 out of 7 days PT Duration Estimated Length of Stay: 3 weeks OT Intensity: Minimum of 1-2 x/day, 45 to 90 minutes OT Frequency: 5 out of 7 days OT Duration/Estimated Length of Stay: 3 weeks  TR Duration/ELOS:  2 weeks TR Frequency:  Min 1 time per week >20 minutes         Team  Interventions: Nursing Interventions Patient/Family Education, Bladder Management, Bowel Management, Medication Management, Psychosocial Support, Disease Management/Prevention, Pain Management, Discharge Planning  PT interventions Ambulation/gait training, Balance/vestibular training, Community reintegration, Engineer, drilling, Functional mobility training, Neuromuscular re-education, Patient/family education, Pain management, Stair training, Therapeutic Exercise, UE/LE Coordination activities, Wheelchair propulsion/positioning, UE/LE Strength taining/ROM, Therapeutic Activities, Psychosocial support  OT Interventions Balance/vestibular training, Cognitive remediation/compensation, Discharge planning, DME/adaptive equipment instruction, Functional mobility training, Neuromuscular re-education, Pain management, Patient/family education, Psychosocial support, Self Care/advanced ADL retraining, Splinting/orthotics, Therapeutic Activities, Skin care/wound managment, Therapeutic Exercise, UE/LE Strength taining/ROM, UE/LE Coordination activities  SLP Interventions    TR Interventions Recreation/leisure participation, Balance/Vestibular training, functional mobility, therapeutic activities, UE/LE strength/coordination, w/c mobility, community reintegration, pt/family education, adaptive equipment instruction/use, discharge planning, psychosocial support  SW/CM Interventions Discharge Planning, Barrister's clerk, Patient/Family Education    Team Discharge Planning: Destination: PT-Home ,OT- Home , SLP-  Projected Follow-up: PT-Home health PT, 24 hour supervision/assistance, OT-  Home health OT, Skilled nursing facility, 24 hour supervision/assistance, SLP-  Projected Equipment Needs: PT-To be determined, OT- To be determined, SLP-  Equipment Details: PT- , OT-  Patient/family involved in discharge planning: PT- Patient,  OT-Patient, SLP-   MD ELOS: 16-21 Medical Rehab Prognosis:   Good Assessment: 59 year old female with h/o Neurosarcoidosis with diffuse peripheral neuropathy, progressive gait disorder and significant visual impairment; who sustained a fall on 05/09/15 during process of moving to [place]. Since that episode she  has had multiple falls with bladder and bowel incontinence and difficulty walking. She was evaluated in ED on 5/26 with negative work up. She was re-admitted on 05/20/15 recurrent fall, HA and sacral pain. CT head revealed advanced centralized atrophy with ex vacuo dilation of ventricular system and was negative for acute changes. MRI Lumbar spine showed sacral fracture S1-S2 with mild angulation and some edema and hemorrhage in the area. No surgical intervention needed per Dr. Kathyrn Sheriff. Thoracic spine MRI demonstrated a small T10 superior endplate compression which appeared acute   Now requiring 24/7 Rehab RN,MD, as well as CIR level PT, OT .  Treatment team will focus on ADLs and mobility with goals set at [facility]   See Team Conference Notes for weekly updates to the plan of care

## 2015-05-27 ENCOUNTER — Inpatient Hospital Stay (HOSPITAL_COMMUNITY): Payer: Medicare Other | Admitting: Occupational Therapy

## 2015-05-27 ENCOUNTER — Inpatient Hospital Stay (HOSPITAL_COMMUNITY): Payer: Medicare Other | Admitting: Physical Therapy

## 2015-05-27 DIAGNOSIS — S3422XS Injury of nerve root of sacral spine, sequela: Secondary | ICD-10-CM

## 2015-05-27 DIAGNOSIS — N312 Flaccid neuropathic bladder, not elsewhere classified: Secondary | ICD-10-CM

## 2015-05-27 NOTE — Progress Notes (Signed)
Subjective/Complaints: Pain still present but she's willing to work through pain.  ROS: Pt denies fever, rash/itching, headache, blurred or double vision, nausea, vomiting, abdominal pain, diarrhea, chest pain, shortness of breath, palpitations, dysuria, dizziness, neck or back pain, bleeding, anxiety, or depression   Objective: Vital Signs: Blood pressure 100/46, pulse 100, temperature 98.6 F (37 C), temperature source Oral, resp. rate 16, height _0  (1.626 m), weight 57.9 kg (127 lb 10.3 oz), SpO2 100 %. No results found. Results for orders placed or performed during the hospital encounter of 05/24/15 (from the past 72 hour(s))  CBC WITH DIFFERENTIAL     Status: Abnormal   Collection Time: 05/25/15  7:57 AM  Result Value Ref Range   WBC 6.4 4.0 - 10.5 K/uL   RBC 3.73 (L) 3.87 - 5.11 MIL/uL   Hemoglobin 10.6 (L) 12.0 - 15.0 g/dL   HCT 30.7 (L) 36.0 - 46.0 %   MCV 82.3 78.0 - 100.0 fL   MCH 28.4 26.0 - 34.0 pg   MCHC 34.5 30.0 - 36.0 g/dL   RDW 15.0 11.5 - 15.5 %   Platelets 337 150 - 400 K/uL   Neutrophils Relative % 58 43 - 77 %   Neutro Abs 3.8 1.7 - 7.7 K/uL   Lymphocytes Relative 27 12 - 46 %   Lymphs Abs 1.7 0.7 - 4.0 K/uL   Monocytes Relative 5 3 - 12 %   Monocytes Absolute 0.3 0.1 - 1.0 K/uL   Eosinophils Relative 8 (H) 0 - 5 %   Eosinophils Absolute 0.5 0.0 - 0.7 K/uL   Basophils Relative 2 (H) 0 - 1 %   Basophils Absolute 0.1 0.0 - 0.1 K/uL  Comprehensive metabolic panel     Status: Abnormal   Collection Time: 05/25/15  7:57 AM  Result Value Ref Range   Sodium 140 135 - 145 mmol/L   Potassium 4.0 3.5 - 5.1 mmol/L   Chloride 104 101 - 111 mmol/L   CO2 26 22 - 32 mmol/L   Glucose, Bld 83 65 - 99 mg/dL   BUN 13 6 - 20 mg/dL   Creatinine, Ser 0.51 0.44 - 1.00 mg/dL   Calcium 8.9 8.9 - 10.3 mg/dL   Total Protein 6.0 (L) 6.5 - 8.1 g/dL   Albumin 2.7 (L) 3.5 - 5.0 g/dL   AST 21 15 - 41 U/L   ALT 15 14 - 54 U/L   Alkaline Phosphatase 218 (H) 38 - 126 U/L   Total Bilirubin 0.3 0.3 - 1.2 mg/dL   GFR calc non Af Amer >60 >60 mL/min   GFR calc Af Amer >60 >60 mL/min    Comment: (NOTE) The eGFR has been calculated using the CKD EPI equation. This calculation has not been validated in all clinical situations. eGFR's persistently <60 mL/min signify possible Chronic Kidney Disease.    Anion gap 10 5 - 15     HEENT: normal Cardio: RRR and no murmur Resp: CTA B/L and unlabored GI: BS positive and nontender nondistended Extremity:  No Edema Skin:   Intact Neuro: Alert/Oriented, Abnormal Sensory reduced sensation persistent in the  sacral dermatomes, especially lower sacral and Abnormal Motor decreased toe flexion and extension,  normal ankle plantar flexion dorsiflexion as well as hip flexion and knee extension.  Musc/Skel:  LB tender Gen. no acute distress   Assessment/Plan: 1. Functional deficits secondary to sacral fracture with sacral nerve root injury causing neurogenic bowel and bladder as well as distal lower extremity numbness and  weakness which require 3+ hours per day of interdisciplinary therapy in a comprehensive inpatient rehab setting. Physiatrist is providing close team supervision and 24 hour management of active medical problems listed below. Physiatrist and rehab team continue to assess barriers to discharge/monitor patient progress toward functional and medical goals. FIM: FIM - Bathing Bathing Steps Patient Completed: Chest, Right Arm, Left Arm, Abdomen, Right upper leg, Left upper leg, Front perineal area Bathing: 3: Mod-Patient completes 5-7 46f10 parts or 50-74%  FIM - Upper Body Dressing/Undressing Upper body dressing/undressing steps patient completed: Thread/unthread right bra strap, Thread/unthread left bra strap, Hook/unhook bra, Thread/unthread right sleeve of pullover shirt/dresss, Thread/unthread left sleeve of pullover shirt/dress, Put head through opening of pull over shirt/dress, Pull shirt over trunk Upper body  dressing/undressing: 5: Set-up assist to: Obtain clothing/put away FIM - Lower Body Dressing/Undressing Lower body dressing/undressing: 1: Total-Patient completed less than 25% of tasks  FIM - Toileting Toileting steps completed by patient: Adjust clothing prior to toileting Toileting Assistive Devices: Grab bar or rail for support Toileting: 2: Max-Patient completed 1 of 3 steps  FIM - TRadio producerDevices: Grab bars Toilet Transfers: 4-To toilet/BSC: Min A (steadying Pt. > 75%)  FIM - Bed/Chair Transfer Bed/Chair Transfer Assistive Devices: Walker, Bed rails Bed/Chair Transfer: 3: Supine > Sit: Mod A (lifting assist/Pt. 50-74%/lift 2 legs, 4: Bed > Chair or W/C: Min A (steadying Pt. > 75%)  FIM - Locomotion: Wheelchair Locomotion: Wheelchair: 1: Total Assistance/staff pushes wheelchair (Pt<25%) FIM - Locomotion: Ambulation Locomotion: Ambulation Assistive Devices: WAdministratorAmbulation/Gait Assistance: 1: +2 Total assist, 4: Min assist Locomotion: Ambulation: 1: Two helpers  Comprehension Comprehension Mode: Auditory Comprehension: 5-Understands complex 90% of the time/Cues < 10% of the time  Expression Expression Mode: Verbal Expression: 5-Expresses complex 90% of the time/cues < 10% of the time  Social Interaction Social Interaction: 5-Interacts appropriately 90% of the time - Needs monitoring or encouragement for participation or interaction.  Problem Solving Problem Solving: 4-Solves basic 75 - 89% of the time/requires cueing 10 - 24% of the time  Memory Memory: 6-More than reasonable amt of time  Medical Problem List and Plan: 1. 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  2. DVT Prophylaxis/Anticoagulation: Pharmaceutical: Lovenox 3. Pain Management: resumed Oxycodone. Continue lidoderm  -gabapentin  -follow for pain control 4. Mood: Continue prozac.   5. Neuropsych: This patient is capable of making decisions on her own behalf. 6. Skin/Wound Care: Routine pressure relief measure.   air mattress overlay to help with pressure relief as well as pain.  7. Fluids/Electrolytes/Nutrition: Monitor I/O. Check lytes in am.  8. Urinary retention: Continue foley till pain and mobility improves then attempt another voiding trial. Also Continue flomax.  9. E. Coli UTI: Antibiotic D 6/7.Rocephin changed to Vantin today. 10. Constipation: continue Miralax bid with suppository daily. 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   LOS (Days) 3 A FACE TO FACE EVALUATION WAS PERFORMED  SWARTZ,ZACHARY T 05/27/2015, 9:30 AM

## 2015-05-27 NOTE — Plan of Care (Signed)
Problem: SCI BLADDER ELIMINATION Goal: RH STG MANAGE BLADDER WITH EQUIPMENT WITH ASSISTANCE STG Manage Bladder With Equipment With mod. Assistance  Outcome: Not Progressing Foley with total care at present  Problem: RH PAIN MANAGEMENT Goal: RH STG PAIN MANAGED AT OR BELOW PT'S PAIN GOAL Patient will maintain a pain level at or below 3.  Outcome: Not Progressing Using oxy q 4 +lidocaine patch and neurontin with pain score of 8

## 2015-05-27 NOTE — Progress Notes (Signed)
Physical Therapy Session Note  Patient Details  Name: Jamie [name] MRN: [id number] Date of Birth: [birth year]  Today's Date: 05/27/2015 PT Individual Time: 0945-1100 PT Individual Time Calculation (min): 75 min   Short Term Goals: Week 1:  PT Short Term Goal 1 (Week 1): Pt will perform bed mobility (R/L rolling, scooting to [facility]) with supervision and min cues PT Short Term Goal 2 (Week 1): Pt will perform bed <-> w/c transfer with supervision and min cues for positioning/placement due to visual impairments PT Short Term Goal 3 (Week 1): Pt will perform ambulation with RW x75 ft with min guard and min cues for navigation PT Short Term Goal 4 (Week 1): Pt will perform ascent/descent of 6 3-inch stair with minA and min verbal cues PT Short Term Goal 5 (Week 1): Pt will improve sitting tolerance in tilt-in-space w/c to 30 min in reclined position  Skilled Therapeutic Interventions/Progress Updates:    Pt received supine in bed; reports of pain in sacrum as described below; otherwise agreeable to treatment. Pt instructed in bed mobility including rolling R/L, and scooting towards HOB, all performed with sup/minA and bedrails. Pt instructed in gait training using RW and minA x100 ft; utilized facilitation techniques to improve weight shifting for improved foot clearance as well as verbal cues to simulate kicking a ball to improve step length. In therapy gym pt instructed in ascent/descent of 6 3-inch stairs x 2 trials with minA. Initial trial performed with step-to pattern leading ascent with RLE; second trial pt instructed to perform reciprocal pattern and pt was able to perform minA with no LOB; facilitation techniques for increasing hip extension and upright posture. Pt again instructed in gait training in hallway using RW; assessment of gait speed performed with pt demonstrating significantly slow velocity of 0.43 ft/sec. Pt educated on role of gait speed on safety and independence, as well as goal of  measuring gait speed to compare progress over time in rehab. Pt returned to room and instructed in seated LE therex to improve LE strength for carryover into gait and balance. Pt states she is worried about remembering too many exercises because she has already learned some with OT and is unable to read written instructions to help remember. Pt instructed in 1 set x 20 reps of BLE hip flexion and knee extension in seated. Attempted performance of glute sets, however due to sensory changes pt has difficulty identifying if she is performing contraction; no palpable contraction noted. Pt remained seated in w/c with all needs within reach.   Therapy Documentation Precautions:  Precautions Precautions: Fall, Back Required Braces or Orthoses: Spinal Brace Spinal Brace: Thoracolumbosacral orthotic Other Brace/Splint: Ordered PRN Restrictions Weight Bearing Restrictions: No Pain: Pain Assessment Pain Assessment: 0-10 Pain Score: 8  Pain Type: Acute pain Pain Location: Sacrum Pain Orientation: Lower Pain Descriptors / Indicators: Aching Pain Onset: On-going Patients Stated Pain Goal: 6 Pain Intervention(s): Other (Comment);Repositioned (pre-medicated) Multiple Pain Sites: No Locomotion : Ambulation Ambulation/Gait Assistance: 4: Min assist   See FIM for current functional status  Therapy/Group: Individual Therapy  Luberta Mutter 05/27/2015, 12:33 PM

## 2015-05-27 NOTE — Progress Notes (Signed)
Occupational Therapy Session Note  Patient Details  Name: Jamie [name] MRN: [id number] Date of Birth: 1956-01-17  Today's Date: 05/27/2015 OT Individual Time: 0700-0800 OT Individual Time Calculation (min): 60 min    Short Term Goals: Week 1:  OT Short Term Goal 1 (Week 1): Pt will complete bathing at mod assist level from EOB OT Short Term Goal 2 (Week 1): Pt will complete LB dressing at sit > stand level with max assist OT Short Term Goal 3 (Week 1): Pt will complete toilet transfer with RW with min assist OT Short Term Goal 4 (Week 1): Pt will complete shower transfer with RW with mod assist  Skilled Therapeutic Interventions/Progress Updates:    Engaged in ADL retraining with focus on bed mobility, sitting balance, sit <> stand, and sitting tolerance.  Completed perineal hygiene at bed level with pt assisting with front perineal area and therapist washing buttocks.  Pt completed rolling at bed level with supervision and use of bed rails.  Rolled to Lt this session and completed supine to sit with mod assist.  Bathing and dressing completed seated at EOB with pt able to touch floor from lower bed demonstrating improved sitting balance.  Pt tolerated sitting EOB for 15 mins to complete bathing and dressing tasks.  Stand step transfer bed > w/c with use of RW and min assist.  Pt completed grooming at sink with setup to open and obtain items.  Setup for breakfast with therapist opening containers and describing setup of tray to increase independence with self-feeding due to impaired vision.  BUE exercises with orange (level 2) theraband.  Engaged in 1 set of 10 with chest pulls and PNF pattern reaching with pt demonstrating appropriate technique.  Encouraged pt to complete theraband exercises during downtime.  Therapy Documentation Precautions:  Precautions Precautions: Fall, Back Required Braces or Orthoses: Spinal Brace Spinal Brace: Thoracolumbosacral orthotic Other Brace/Splint: Ordered  PRN Restrictions Weight Bearing Restrictions: No General:   Vital Signs: Therapy Vitals Temp: 98.6 F (37 C) Temp Source: Oral Pulse Rate: 100 Resp: 16 BP: (!) 100/46 mmHg Patient Position (if appropriate): Lying Oxygen Therapy SpO2: 100 % O2 Device: Not Delivered Pain: Pain Assessment Pain Score: 2   See FIM for current functional status  Therapy/Group: Individual Therapy  Simonne Come 05/27/2015, 8:06 AM

## 2015-05-27 NOTE — Progress Notes (Signed)
Occupational Therapy Session Note  Patient Details  Name: Jamie [name] MRN: [id number] Date of Birth: 30-Oct-1956  Today's Date: 05/27/2015 OT Individual Time: 1500-1600 OT Individual Time Calculation (min): 60 min    Short Term Goals: Week 1:  OT Short Term Goal 1 (Week 1): Pt will complete bathing at mod assist level from EOB OT Short Term Goal 2 (Week 1): Pt will complete LB dressing at sit > stand level with max assist OT Short Term Goal 3 (Week 1): Pt will complete toilet transfer with RW with min assist OT Short Term Goal 4 (Week 1): Pt will complete shower transfer with RW with mod assist  Skilled Therapeutic Interventions/Progress Updates:  Upon entering the room, pt supine in bed with no c/o pain. Pt agreeable to participate in OT intervention.  OT session with focus on functional transfer, pt education, and B UE strengthening. Pt performed supine >sit with min A and min verbal cues for technique. Stand pivot transfer with min A to wheelchair from bed. Once seated, pt demonstrating 2 of 3 exercises taught in prior sessions of chest pull and shoulder diagonals. Pt was unable to remember shoulder flexion exercises and therefore exercises demonstrated via hand over hand assist for pt based on visual limitations. Pt performing 3 sets of 10 of each exercise with rest breaks for fatigue. Pt also educated on alternating punches which pt demonstrated 10 reps to show understanding. OT also discussed energy conservation for self care tasks as well as general concepts/principles. Pt verbalized understanding. She requested to return to bed at end of session. Min A stand pivot with RW from wheelchair >bed. Sit >supine with supervision. Bed alarm on and call bell within reach upon exiting the room.   Therapy Documentation Precautions:  Precautions Precautions: Fall, Back Required Braces or Orthoses: Spinal Brace Spinal Brace: Thoracolumbosacral orthotic Other Brace/Splint: Ordered  PRN Restrictions Weight Bearing Restrictions: No  See FIM for current functional status  Therapy/Group: Individual Therapy  Phineas Semen 05/27/2015, 4:25 PM

## 2015-05-28 ENCOUNTER — Inpatient Hospital Stay (HOSPITAL_COMMUNITY): Payer: Medicare Other | Admitting: Occupational Therapy

## 2015-05-28 ENCOUNTER — Inpatient Hospital Stay (HOSPITAL_COMMUNITY): Payer: Medicare Other | Admitting: Physical Therapy

## 2015-05-28 NOTE — Progress Notes (Addendum)
Physical Therapy Session Note  Patient Details  Name: Jamie [name] MRN: [id number] Date of Birth: 1956-10-09  Today's Date: 05/28/2015 PT Individual Time: 0900-1000 6063-0160 PT Individual Time Calculation (min): 60 min, 30 min  Short Term Goals: Week 1:  PT Short Term Goal 1 (Week 1): Pt will perform bed mobility (R/L rolling, scooting to [facility]) with supervision and min cues PT Short Term Goal 2 (Week 1): Pt will perform bed <-> w/c transfer with supervision and min cues for positioning/placement due to visual impairments PT Short Term Goal 3 (Week 1): Pt will perform ambulation with RW x75 ft with min guard and min cues for navigation PT Short Term Goal 4 (Week 1): Pt will perform ascent/descent of 6 3-inch stair with minA and min verbal cues PT Short Term Goal 5 (Week 1): Pt will improve sitting tolerance in tilt-in-space w/c to 30 min in reclined position  Skilled Therapeutic Interventions/Progress Updates:   Pt limited in session by complaints of pain, bowel movement, and verbosity. Pt reports sacrum feels better with unloading but still with BLE pain. Pt with fear avoidance beliefs regarding activity limiting progress. Pt amenable to education to address these deficits. Improved performance in pm session with pain less limiting. Pt would continue to benefit from skilled PT services to increase functional mobility.  Therapy Documentation Precautions:  Precautions Precautions: Fall, Back Required Braces or Orthoses: Spinal Brace Spinal Brace: Thoracolumbosacral orthotic Other Brace/Splint: Ordered PRN Restrictions Weight Bearing Restrictions: No Pain: Pain Assessment Pain Score: 10-Worst pain ever Pain Type: Acute pain Pain Location: Back Pain Orientation: Lower Mobility:  Pt performs bed mobility and transfers with Min A with cues for weight shift, technique Locomotion : Ambulation Ambulation/Gait Assistance: 4: Min assist  Other Treatments:  Tx 1: Pt performs transfers x5 in  session. Pt educated on rehab plan, OOB, positioning, safety in mobility, HEP of hip IR/ER and progressing mobility. Pt positioned into more neutral spine alignment. Supine therex including hip IR/ER AROM, heel slides, glute sets, AAROM SLR, hip abd AAROM, hip abd AROM, ankle pumps 2x10. Unsupported sitting as active rest. Pt performs static and dynamic sitting and standing balance with dual motor tasks including weight shifting and reaching within functional context.  Tx 2: Pt educated on rehab plan, safety in mobility, and pain science. Pt performs transfers x4 in session. Pt standing balance static 1'x2. Pt performs hip abd/add isometrics, seated knee flexion, hip ER/IR AROM, HS isometrics 2x10. Pt positioned in neutral spine.  See FIM for current functional status  Therapy/Group: Individual Therapy  Monia Pouch 05/28/2015, 12:45 PM

## 2015-05-28 NOTE — Plan of Care (Signed)
Problem: SCI BLADDER ELIMINATION Goal: RH STG MANAGE BLADDER WITH MEDICATION WITH ASSISTANCE STG Manage Bladder With Medication With min. Assistance.  Outcome: Not Progressing Foley catheter for acute urinary retention

## 2015-05-28 NOTE — Progress Notes (Signed)
Occupational Therapy Session Note  Patient Details  Name: Jamie [name] MRN: [id number] Date of Birth: [birth year]  Today's Date: 05/28/2015 OT Individual Time: 1300-1345 OT Individual Time Calculation (min): 45 min    Short Term Goals: Week 1:  OT Short Term Goal 1 (Week 1): Pt will complete bathing at mod assist level from EOB OT Short Term Goal 2 (Week 1): Pt will complete LB dressing at sit > stand level with max assist OT Short Term Goal 3 (Week 1): Pt will complete toilet transfer with RW with min assist OT Short Term Goal 4 (Week 1): Pt will complete shower transfer with RW with mod assist  Skilled Therapeutic Interventions/Progress Updates:   Patient seen for occupational therapy this afternoon with emphasis on functional mobility, standing balance, standing tolerance, activity tolerance, strength, and ADL performance.  Patient upright in wheelchair upon therapist arrival.  Reports pain level 5/10 in sitting, reclined position at rest.  Aspen brace donned for comfort at initation of session with setup/assist from therapist.  Patient completes wheelchair <> EOM with min assist stand pivot transfer.  Patient completes dynamic sitting balance tasks with emphasis on postural control and strength during task.  Patient completes BUE scapular protraction/retraction on inclined wedge placed on tabletop in front of patient, 5 sets x 10 reps.  Patient also completed horizontal abduction/adduction, 3 sets x 10 reps, on tabletop.  Reports pain level 8/10 with activity sitting EOM.  Patient returned to chair reporting she needed to use the bathroom.  Patient completes wheelchair > toilet via stand step transfer with use of rails with min assist.  Patient able to complete clothing management down with assist from therapist.  Patient reported she needed to continue toileting task when session time expired; RN in bathroom with patient at discontinuation of session.    Therapy Documentation Precautions:   Precautions Precautions: Fall, Back Required Braces or Orthoses: Spinal Brace Spinal Brace: Thoracolumbosacral orthotic Other Brace/Splint: Ordered PRN Restrictions Weight Bearing Restrictions: No Pain: Pain Assessment Pain Assessment: 0-10 Pain Score: 8  Pain Type: Acute pain Pain Location: Buttocks Pain Orientation: Lower  See FIM for current functional status  Therapy/Group: Individual Therapy  Osa Craver 05/28/2015, 4:00 PM

## 2015-05-28 NOTE — Progress Notes (Signed)
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    She was able to distinguish  white cups and other white items on the white sink.   Patient would benefit from more sit to stand and trunk stability activities and finding her way, given her blindness.     She was left in her w/c with call bell in place receiving assist from nurse to set her up for lunch at the end of the session.     Therapy Documentation Precautions:  Precautions Precautions: Fall, Back Required Braces or Orthoses: Spinal Brace Spinal Brace: Thoracolumbosacral orthotic Other Brace/Splint: Ordered PRN Restrictions Weight Bearing Restrictions: No General:   Vital Signs:  Pain: denied ASee FIM for current functional status  Therapy/Group: Individual Therapy  Alfredia Ferguson [facility] 05/28/2015, 2:31 PM

## 2015-05-28 NOTE — Progress Notes (Signed)
Subjective/Complaints: Pain persists. Otherwise working through therapies. Denies new problems. Foley still in ROS: Pt denies fever, rash/itching, headache, blurred or double vision, nausea, vomiting, abdominal pain, diarrhea, chest pain, shortness of breath, palpitations, dysuria, dizziness, neck or back pain, bleeding, anxiety, or depression   Objective: Vital Signs: Blood pressure 108/57, pulse 91, temperature 97.9 F (36.6 C), temperature source Oral, resp. rate 18, height 5\' 4"  (1.626 m), weight 57.9 kg (127 lb 10.3 oz), SpO2 100 %. No results found. No results found for this or any previous visit (from the past 72 hour(s)).   HEENT: normal Cardio: RRR and no murmur Resp: CTA B/L and unlabored GI: BS positive and nontender nondistended Extremity:  No Edema Skin:   Intact Neuro: Alert/Oriented, Abnormal Sensory reduced sensation persistent in the lower sacral dermatomes, especially lower sacral and Abnormal Motor decreased toe flexion and extension,  normal ankle plantar flexion dorsiflexion as well as hip flexion and knee extension.  Musc/Skel:  LB tender Gen. no acute distress, frail appearing   Assessment/Plan: 1. 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 Physiatrist is providing close team supervision and 24 hour management of active medical problems listed below. Physiatrist and rehab team continue to assess barriers to discharge/monitor patient progress toward functional and medical goals. FIM: FIM - Bathing Bathing Steps Patient Completed: Chest, Right Arm, Left Arm, Abdomen, Right upper leg, Left upper leg, Front perineal area Bathing: 3: Mod-Patient completes 5-7 5f 10 parts or 50-74%  FIM - Upper Body Dressing/Undressing Upper body  dressing/undressing steps patient completed: Thread/unthread right bra strap, Thread/unthread left bra strap, Hook/unhook bra, Thread/unthread right sleeve of pullover shirt/dresss, Thread/unthread left sleeve of pullover shirt/dress, Put head through opening of pull over shirt/dress, Pull shirt over trunk Upper body dressing/undressing: 5: Set-up assist to: Obtain clothing/put away FIM - Lower Body Dressing/Undressing Lower body dressing/undressing: 1: Total-Patient completed less than 25% of tasks  FIM - Toileting Toileting steps completed by patient: Adjust clothing prior to toileting Toileting Assistive Devices: Grab bar or rail for support Toileting: 2: Max-Patient completed 1 of 3 steps  FIM - Radio producer Devices: Grab bars Toilet Transfers: 4-To toilet/BSC: Min A (steadying Pt. > 75%)  FIM - Bed/Chair Transfer Bed/Chair Transfer Assistive Devices: Walker, Bed rails Bed/Chair Transfer: 4: Supine > Sit: Min A (steadying Pt. > 75%/lift 1 leg), 5: Sit > Supine: Supervision (verbal cues/safety issues), 4: Bed > Chair or W/C: Min A (steadying Pt. > 75%), 4: Chair or W/C > Bed: Min A (steadying Pt. > 75%)  FIM - Locomotion: Wheelchair Locomotion: Wheelchair: 1: Total Assistance/staff pushes wheelchair (Pt<25%) FIM - Locomotion: Ambulation Locomotion: Ambulation Assistive Devices: Administrator Ambulation/Gait Assistance: 4: Min assist Locomotion: Ambulation: 2: Travels 50 - 149 ft with minimal assistance (Pt.>75%)  Comprehension Comprehension Mode: Auditory Comprehension: 5-Understands basic 90% of the time/requires cueing < 10% of the time  Expression Expression Mode: Verbal Expression: 5-Expresses basic 90% of the time/requires cueing < 10% of the time.  Social Interaction Social Interaction: 5-Interacts appropriately 90% of the time - Needs monitoring or encouragement for participation or interaction.  Problem Solving Problem Solving:  4-Solves basic 75 - 89% of the time/requires cueing 10 - 24% of the time  Memory Memory: 6-More than reasonable amt of time  Medical Problem List and Plan:  1. 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  2. DVT Prophylaxis/Anticoagulation: Pharmaceutical: Lovenox 3. Pain Management: resumed Oxycodone. Continue lidoderm  -gabapentin  -patient aware that she will have to work through pain 4. Mood: Continue prozac.  5. Neuropsych: This patient is capable of making decisions on her own behalf. 6. Skin/Wound Care: Routine pressure relief measure.   air mattress overlay to help with pressure relief as well as pain.  7. Fluids/Electrolytes/Nutrition: intake fair at present  8. Urinary retention: consider voiding trial next week. Also Continue flomax.  9. E. Coli UTI: Antibiotics complete. 10. Constipation: continue Miralax bid with suppository daily. 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   LOS (Days) 4 A FACE TO FACE EVALUATION WAS PERFORMED  SWARTZ,ZACHARY T 05/28/2015, 8:36 AM

## 2015-05-29 ENCOUNTER — Inpatient Hospital Stay (HOSPITAL_COMMUNITY): Payer: Medicare Other | Admitting: Occupational Therapy

## 2015-05-29 NOTE — Progress Notes (Signed)
Occupational Therapy Session Note  Patient Details  Name: Jamie [name] MRN: [id number] Date of Birth: 27-Jul-1956  Today's Date: 05/29/2015 OT Individual Time: 8325-4982 OT Individual Time Calculation (min): 60 min    Short Term Goals: Week 1:  OT Short Term Goal 1 (Week 1): Pt will complete bathing at mod assist level from EOB OT Short Term Goal 2 (Week 1): Pt will complete LB dressing at sit > stand level with max assist OT Short Term Goal 3 (Week 1): Pt will complete toilet transfer with RW with min assist OT Short Term Goal 4 (Week 1): Pt will complete shower transfer with RW with mod assist  Skilled Therapeutic Interventions/Progress Updates:    Engaged in ADL retraining with focus on functional transfers, sit <> stand, and sitting tolerance.  Supervision bed mobility to EOB.  Ambulated to w/c with RW with min assist.  Oral hygiene completed in sitting with increased time and cues to locate items due to visual deficits.  Pt reports possible sensation to have BM, performed stand pivot transfer w/c > padded toilet seat with min assist with use of grab bars.  Already had BM in incontinence brief with no additional success on commode.  Returned to w/c stand pivot with min assist.  Completed bathing and dressing at sit > stand level at sink with pt requiring increased time and cues to locate items due to visual deficits and decreased memory with sequencing.  Min assist sit > stand to pull up pants with pt assisting but requiring total assist for LB dressing this session.  Pt left tilted back in tilt-in-space w/c with all needs in reach.  Therapy Documentation Precautions:  Precautions Precautions: Fall, Back Required Braces or Orthoses: Spinal Brace Spinal Brace: Thoracolumbosacral orthotic Other Brace/Splint: Ordered PRN Restrictions Weight Bearing Restrictions: No General:   Vital Signs: Therapy Vitals Pulse Rate: (!) 106 BP: (!) 102/34 mmHg Patient Position (if appropriate):  Sitting Pain: Pain Assessment Pain Assessment: 0-10 Pain Score: 7  Pain Type: Acute pain Pain Location: Buttocks  See FIM for current functional status  Therapy/Group: Individual Therapy  Simonne Come 05/29/2015, 9:56 AM

## 2015-05-29 NOTE — Progress Notes (Signed)
Subjective/Complaints: Able to deal with pain. Asked today if family could bring in cervical pillow and vitamins from home.  ROS: Pt denies fever, rash/itching, headache, blurred or double vision, nausea, vomiting, abdominal pain, diarrhea, chest pain, shortness of breath, palpitations, dysuria, dizziness,   bleeding, anxiety, or depression   Objective: Vital Signs: Blood pressure 99/49, pulse 110, temperature 98.4 F (36.9 C), temperature source Oral, resp. rate 20, height 5\' 4"  (1.626 m), weight 57.9 kg (127 lb 10.3 oz), SpO2 100 %. No results found. No results found for this or any previous visit (from the past 72 hour(s)).   HEENT: normal Cardio: RRR and no murmur Resp: CTA B/L and unlabored GI: BS positive and nontender nondistended Extremity:  No Edema Skin:   Intact Neuro: Alert/Oriented with good insight and awareness. Abnormal Sensory reduced sensation persistent in the lower sacral dermatomes. Weakness with toe flexion and extension,  normal ankle plantar flexion dorsiflexion as well as hip flexion and knee extension. Blind.  Musc/Skel:  LB tender Gen. no acute distress, frail appearing   Assessment/Plan: 1. 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 Physiatrist is providing close team supervision and 24 hour management of active medical problems listed below. Physiatrist and rehab team continue to assess barriers to discharge/monitor patient progress toward functional and medical goals. FIM: FIM - Bathing Bathing Steps Patient Completed: Chest, Right Arm, Left Arm, Abdomen, Front perineal area, Right upper leg, Left upper leg Bathing: 3: Mod-Patient completes 5-7 14f 10 parts or 50-74%  FIM - Upper Body Dressing/Undressing Upper body  dressing/undressing steps patient completed: Thread/unthread right sleeve of pullover shirt/dresss, Put head through opening of pull over shirt/dress, Thread/unthread left sleeve of pullover shirt/dress Upper body dressing/undressing: 4: Min-Patient completed 75 plus % of tasks FIM - Lower Body Dressing/Undressing Lower body dressing/undressing steps patient completed: Thread/unthread left pants leg, Thread/unthread right pants leg, Fasten/unfasten left shoe, Fasten/unfasten right shoe Lower body dressing/undressing: 2: Max-Patient completed 25-49% of tasks  FIM - Toileting Toileting steps completed by patient: Adjust clothing prior to toileting Toileting Assistive Devices: Grab bar or rail for support Toileting: 0: Activity did not occur  FIM - Radio producer Devices: Product manager Transfers: 0-Activity did not occur  FIM - Control and instrumentation engineer Devices: Environmental consultant, [facility] rails Bed/Chair Transfer: 4: Supine > Sit: Min A (steadying Pt. > 75%/lift 1 leg), 5: Sit > Supine: Supervision (verbal cues/safety issues), 4: Bed > Chair or W/C: Min A (steadying Pt. > 75%), 4: Chair or W/C > Bed: Min A (steadying Pt. > 75%)  FIM - Locomotion: Wheelchair Locomotion: Wheelchair: 1: Total Assistance/staff pushes wheelchair (Pt<25%) FIM - Locomotion: Ambulation Locomotion: Ambulation Assistive Devices: Administrator Ambulation/Gait Assistance: 4: Min assist Locomotion: Ambulation: 2: Travels 50 - 149 ft with minimal assistance (Pt.>75%)  Comprehension Comprehension Mode: Auditory Comprehension: 5-Understands basic 90% of the time/requires cueing < 10% of the time  Expression Expression Mode: Verbal Expression: 5-Expresses complex 90% of the time/cues < 10% of the time  Social Interaction Social Interaction: 5-Interacts appropriately 90% of the time - Needs monitoring or encouragement for participation or interaction.  Problem  Solving Problem Solving: 4-Solves basic 75 - 89% of the time/requires cueing 10 - 24% of the time  Memory Memory: 6-More than reasonable amt of time  Medical  Problem List and Plan: 1. 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  2. DVT Prophylaxis/Anticoagulation: Pharmaceutical: Lovenox 3. Pain Management: resumed Oxycodone. Continue lidoderm  -gabapentin  -patient aware that she will have to work through pain  -may bring in cervical pillow for comfort 4. Mood: Continue prozac.  5. Neuropsych: This patient is capable of making decisions on her own behalf. 6. Skin/Wound Care: Routine pressure relief measure.   air mattress overlay to help with pressure relief as well as pain.  7. Fluids/Electrolytes/Nutrition: intake fair at present   -told her we could review her vitamin/supplements and find something somewhat equivalent to use from hospital formulary 8. Urinary retention: consider voiding trial next week. Also Continue flomax.  9. E. Coli UTI: Antibiotics complete. 10. Constipation: continue Miralax bid with suppository daily. 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   LOS (Days) 5 A FACE TO FACE EVALUATION WAS PERFORMED  Kidus Delman T 05/29/2015, 8:54 AM

## 2015-05-29 NOTE — Progress Notes (Signed)
05/29/15 1000 nursing RN called patients father re: home med  multivitamins and cervical pillow per patient request. They will bring it next visit.

## 2015-05-29 NOTE — Plan of Care (Signed)
Problem: SCI BLADDER ELIMINATION Goal: RH STG MANAGE BLADDER WITH MEDICATION WITH ASSISTANCE STG Manage Bladder With Medication With min. Assistance.  Outcome: Not Progressing Foley cath for retention

## 2015-05-30 ENCOUNTER — Inpatient Hospital Stay (HOSPITAL_COMMUNITY): Payer: Medicare Other

## 2015-05-30 ENCOUNTER — Inpatient Hospital Stay (HOSPITAL_COMMUNITY): Payer: Medicare Other | Admitting: Physical Therapy

## 2015-05-30 ENCOUNTER — Inpatient Hospital Stay (HOSPITAL_COMMUNITY): Payer: Medicare Other | Admitting: Occupational Therapy

## 2015-05-30 MED ORDER — ADULT MULTIVITAMIN W/MINERALS CH
1.0000 | ORAL_TABLET | Freq: Every day | ORAL | Status: DC
  Administered 2015-05-30 – 2015-06-10 (×12): 1 via ORAL
  Filled 2015-05-30 (×14): qty 1

## 2015-05-30 NOTE — Progress Notes (Signed)
Occupational Therapy Session Note  Patient Details  Name: Jamie [name] MRN: [id number] Date of Birth: [birth year]  Today's Date: 05/30/2015 OT Individual Time: 1100-1200 OT Individual Time Calculation (min): 60 min    Short Term Goals: Week 1:  OT Short Term Goal 1 (Week 1): Pt will complete bathing at mod assist level from EOB OT Short Term Goal 2 (Week 1): Pt will complete LB dressing at sit > stand level with max assist OT Short Term Goal 3 (Week 1): Pt will complete toilet transfer with RW with min assist OT Short Term Goal 4 (Week 1): Pt will complete shower transfer with RW with mod assist  Skilled Therapeutic Interventions/Progress Updates:    Pt resting in w/c upon arrival and agreeable to participating in therapy.  Pt initially engaged in sit<>stand and standing balance tasks with steady A. Pt requested use of toilet.  Pt performed stand step transfer using grab bars with min A.  Pt required assistance with hygiene and pulling up pants.  Pt stood at sink to wash hands.  Focus on functional transfers, sit<>stand, standing balance, safety awareness, and activity tolerance.  Therapy Documentation Precautions:  Precautions Precautions: Fall, Back Required Braces or Orthoses: Spinal Brace Spinal Brace: Thoracolumbosacral orthotic Other Brace/Splint: Ordered PRN Restrictions Weight Bearing Restrictions: No  Pain: Pain Assessment Pain Assessment: 0-10 Pain Score: 7  Pain Type: Acute pain Pain Location: Buttocks Pain Orientation: Right;Left Pain Descriptors / Indicators: Aching Pain Onset: On-going Pain Intervention(s): Repositioned;RN made aware  See FIM for current functional status  Therapy/Group: Individual Therapy  Leroy Libman 05/30/2015, 12:04 PM

## 2015-05-30 NOTE — Progress Notes (Signed)
Recreational Therapy Assessment and Plan  Patient Details  Name: Jamie [name] MRN: [id number] Date of Birth: 05/24/1956 Today's Date: 05/30/2015  Rehab Potential: Good ELOS: 2 weeks   Assessment Clinical Impression:  Problem List:  Patient Active Problem List   Diagnosis Date Noted  . 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 Neurogenic bowel 05/24/2015  . 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 Hypotonic neurogenic bladder   . Leg weakness   . Sacral fracture, closed 05/21/2015  . 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 UTI (lower urinary tract infection) 05/21/2015  . Urinary retention 05/21/2015    Past Medical History:  Past Medical History  Diagnosis Date  . 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 Neurosarcoidosis in adult     followed at [facility]  . 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 Progressive gait disorder     Was treated for probable MS until diagnosed with Neurosarcoidosis about 10 years ago.    Past Surgical History: No past surgical history on file.  Assessment & Plan Clinical Impression: Patient is a 59 y.o. year old female with h/oNeurosarcoidosis with diffuse peripheral neuropathy, progressive gait disorder and significant visual impairment; who sustained a fall on 05/09/15 during process of moving to [place]. Since that episode she has had multiple falls with bladder and bowel incontinence and difficulty walking. She was evaluated in ED on 5/26 with negative work up. She was re-admitted on 05/20/15 recurrent fall, HA and sacral pain. CT head revealed advanced centralized atrophy with ex vacuo dilation of ventricular system and was negative for acute changes. MRI Lumbar spine showed sacral fracture S1-S2 with mild angulation and some edema and hemorrhage in the area. No surgical intervention needed per Dr. Nundkumar. Thoracic spine MRI demonstrated a small T10 superior endplate compression which  appeared acute. Cervical spine MRI showed no compressive lesions no evidence of fractures. Dr. Yates consulted and advised conservative care with TLSO for support. Neurology consulted for instability of gait with workup felt to be suspect nonfocal concussion like trauma to the spinal cord as no compressive lesions noted. She has had issues with neuropathy BLE as well as urinary retention requiring foley placement. She was started on rocephin for E coli UTI. She failed voiding trial on 06/06 therefore foley was replaced. She has had improvement in BLE strength but continue to be limited by significant sacral pain. Patient transferred to CIR on 05/24/2015 .  .    Pt presents with decreased activity tolerance, decreased functional mobility, decreased balance, decreased coordination & progressive blindness Limiting pt's independence with leisure/community pursuits.   Leisure History/Participation Premorbid leisure interest/current participation: Crafts - Painting;Crafts - Knitting/Crocheting;Community - Grocery store;Community - Shopping mall;Community - Travel (Comment) Expression Interests: Music (Comment);Play instrument (Comment) (drums) Other Leisure Interests: Reading ("talking books") Leisure Participation Style: Alone;With Family/Friends Awareness of Community Resources: Good-identify 3 post discharge leisure resources Psychosocial / Spiritual Social interaction - Mood/Behavior: Cooperative Community Reintegration Appropriate for Education?: Yes Recreational Therapy Orientation Orientation -Reviewed with patient: Available activity resources Strengths/Weaknesses Patient Strengths/Abilities: Willingness to participate;Active premorbidly Patient weaknesses: Physical limitations TR Patient demonstrates impairments in the following area(s): Endurance;Motor;Pain;Safety  Plan Rec Therapy Plan Is patient appropriate for Therapeutic Recreation?: Yes Rehab Potential: Good Treatment times per  week: Min 1 time per week >20 minutes Estimated Length of Stay: 2 weeks TR Treatment/Interventions: Adaptive equipment instruction;1:1 session;Balance/vestibular training;Functional mobility training;Community reintegration;Patient/family education;Therapeutic activities;Recreation/leisure participation;Therapeutic exercise;UE/LE Coordination activities;Visual/perceptual remediation/compensation;Wheelchair propulsion/positioning  Recommendations for other services: None  Discharge Criteria: Patient will be discharged from   TR if patient refuses treatment 3 consecutive times without medical reason.  If treatment goals not met, if there is a change in medical status, if patient makes no progress towards goals or if patient is discharged from hospital.  The above assessment, treatment plan, treatment alternatives and goals were discussed and mutually agreed upon: by patient  SIMPSON,Taylia 05/30/2015, 10:15 AM  

## 2015-05-30 NOTE — Progress Notes (Signed)
Subjective/Complaints: Patient ambulating with therapy, some sleeping issues but no other severe pain complaints at the current time.  ROS: Pt denies fever, rash/itching, headache, blurred or double vision, nausea, vomiting, abdominal pain, diarrhea, chest pain, shortness of breath, palpitations, dysuria, dizziness,   bleeding, anxiety, or depression   Objective: Vital Signs: Blood pressure 107/61, pulse 95, temperature 98.2 F (36.8 C), temperature source Oral, resp. rate 18, height 5\' 4"  (1.626 m), weight 57.9 kg (127 lb 10.3 oz), SpO2 100 %. No results found. No results found for this or any previous visit (from the past 72 hour(s)).   HEENT: normal Cardio: RRR and no murmur Resp: CTA B/L and unlabored GI: BS positive and nontender nondistended Extremity:  No Edema Skin:   Intact Neuro: Alert/Oriented with good insight and awareness. Abnormal Sensory reduced sensation persistent in the lower sacral dermatomes. Weakness with toe flexion and extension,  normal ankle plantar flexion dorsiflexion as well as hip flexion and knee extension. Blind.  Musc/Skel:  LB tender Gen. no acute distress, frail appearing   Assessment/Plan: 1. 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 Physiatrist is providing close team supervision and 24 hour management of active medical problems listed below. Physiatrist and rehab team continue to assess barriers to discharge/monitor patient progress toward functional and medical goals. FIM: FIM - Bathing Bathing Steps Patient Completed: Chest, Right Arm, Left Arm, Abdomen, Front perineal area, Right upper leg, Left upper leg Bathing: 3: Mod-Patient completes 5-7 2f 10 parts or 50-74%  FIM - Upper Body Dressing/Undressing Upper  body dressing/undressing steps patient completed: Thread/unthread right bra strap, Thread/unthread left bra strap, Hook/unhook bra, Thread/unthread right sleeve of pullover shirt/dresss, Thread/unthread left sleeve of pullover shirt/dress, Put head through opening of pull over shirt/dress, Pull shirt over trunk Upper body dressing/undressing: 5: Set-up assist to: Obtain clothing/put away FIM - Lower Body Dressing/Undressing Lower body dressing/undressing steps patient completed: Thread/unthread left pants leg, Thread/unthread right pants leg, Fasten/unfasten left shoe, Fasten/unfasten right shoe Lower body dressing/undressing: 1: Total-Patient completed less than 25% of tasks  FIM - Toileting Toileting steps completed by patient: Adjust clothing prior to toileting, Performs perineal hygiene Toileting Assistive Devices: Grab bar or rail for support Toileting: 3: Mod-Patient completed 2 of 3 steps  FIM - Radio producer Devices: Grab bars Toilet Transfers: 4-To toilet/BSC: Min A (steadying Pt. > 75%), 4-From toilet/BSC: Min A (steadying Pt. > 75%)  FIM - Bed/Chair Transfer Bed/Chair Transfer Assistive Devices: Walker, HOB elevated Bed/Chair Transfer: 5: Supine > Sit: Supervision (verbal cues/safety issues), 5: Sit > Supine: Supervision (verbal cues/safety issues), 4: Bed > Chair or W/C: Min A (steadying Pt. > 75%), 4: Chair or W/C > Bed: Min A (steadying Pt. > 75%)  FIM - Locomotion: Wheelchair Locomotion: Wheelchair: 1: Total Assistance/staff pushes wheelchair (Pt<25%) FIM - Locomotion: Ambulation Locomotion: Ambulation Assistive Devices: Administrator Ambulation/Gait Assistance: 4: Min assist Locomotion: Ambulation: 2: Travels 50 - 149 ft with minimal assistance (Pt.>75%)  Comprehension Comprehension Mode: Auditory Comprehension: 5-Understands basic 90% of the time/requires cueing < 10% of the time  Expression Expression Mode: Verbal Expression:  5-Expresses basic 90% of the time/requires cueing < 10% of the time.  Social Interaction Social Interaction: 5-Interacts appropriately 90% of the time - Needs monitoring or encouragement for participation or interaction.  Problem Solving  Problem Solving: 4-Solves basic 75 - 89% of the time/requires cueing 10 - 24% of the time  Memory Memory: 5-Recognizes or recalls 90% of the time/requires cueing < 10% of the time  Medical Problem List and Plan: 1. 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  2. DVT Prophylaxis/Anticoagulation: Pharmaceutical: Lovenox 3. Pain Management: resumed Oxycodone. Continue lidoderm  -gabapentin    -may bring in cervical pillow for comfort 4. Mood: Continue prozac.  5. Neuropsych: This patient is capable of making decisions on her own behalf. 6. Skin/Wound Care: Routine pressure relief measure.   air mattress overlay to help with pressure relief as well as pain.  7. Fluids/Electrolytes/Nutrition: intake fair at present   -told her we could review her vitamin/supplements and find something somewhat equivalent to use from hospital formulary 8. Urinary retention: consider voiding trial next week. Also Continue flomax.  9. E. Coli UTI: Antibiotics complete. 10. Constipation: continue Miralax bid with suppository daily. 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   LOS (Days) 6 A FACE TO FACE EVALUATION WAS PERFORMED  KIRSTEINS,ANDREW E 05/30/2015, 5:21 PM

## 2015-05-30 NOTE — Progress Notes (Signed)
Occupational Therapy Session Note  Patient Details  Name: Jamie [name] MRN: [id number] Date of Birth: 28-Mar-1956  Today's Date: 05/30/2015 OT Individual Time: 1433-1530 OT Individual Time Calculation (min): 57 min   Skilled Therapeutic Interventions/Progress Updates:    Pt ambulated to the commode bench positioned over the toilet in her with min assist.  Min assist to for therapist to assist with guiding the walker and pt with noted very small short steps throughout transfer.  Min assist for balance while performing clothing management and hygiene in standing as well.  She needed max assist for donning new brief and for pulling pants over her hips.  Once she finished she ambulated out to the sink for washing her hands, once again in standing.  Min assist for support.  Pt with decreased balance noted when attempting to stand without UE support during grooming task.  Transferred back to tilt in space wheelchair at end of session with min assist.  Pt tolerating being upright much better this session.  Pt left in chair with call button and nursing tech present.    Therapy Documentation Precautions:  Precautions Precautions: Fall, Back Required Braces or Orthoses: Spinal Brace Spinal Brace: Thoracolumbosacral orthotic Other Brace/Splint: Ordered PRN Restrictions Weight Bearing Restrictions: No  Vital Signs: Therapy Vitals Temp: 98.2 F (36.8 C) Temp Source: Oral Pulse Rate: 95 Resp: 18 BP: 107/61 mmHg Patient Position (if appropriate): Sitting Oxygen Therapy SpO2: 100 % O2 Device: Not Delivered Pain: Pain Assessment Pain Assessment: 0-10 Pain Score: 8  Faces Pain Scale: Hurts a little bit Pain Type: Acute pain Pain Location: Sacrum Pain Orientation: Mid Pain Descriptors / Indicators: Aching Pain Onset: On-going Patients Stated Pain Goal: 6 Pain Intervention(s): Repositioned;Other (Comment) (pre-medicated) Multiple Pain Sites: No ADL: See FIM for current functional  status  Therapy/Group: Individual Therapy  Nyiesha Beever OTR/L 05/30/2015, 4:38 PM

## 2015-05-30 NOTE — Progress Notes (Signed)
Physical Therapy Session Note  Patient Details  Name: Jamie [name] MRN: [id number] Date of Birth: 1956-07-01  Today's Date: 05/30/2015 PT Individual Time:  -      Short Term Goals: Week 1:  PT Short Term Goal 1 (Week 1): Pt will perform bed mobility (R/L rolling, scooting to [facility]) with supervision and min cues PT Short Term Goal 2 (Week 1): Pt will perform bed <-> w/c transfer with supervision and min cues for positioning/placement due to visual impairments PT Short Term Goal 3 (Week 1): Pt will perform ambulation with RW x75 ft with min guard and min cues for navigation PT Short Term Goal 4 (Week 1): Pt will perform ascent/descent of 6 3-inch stair with minA and min verbal cues PT Short Term Goal 5 (Week 1): Pt will improve sitting tolerance in tilt-in-space w/c to 30 min in reclined position  Skilled Therapeutic Interventions/Progress Updates:    Pt received supine in bed with HOB elevated; pt reports sacral pain 8/10 as described below, otherwise agreeable to treatment. Pt instructed in bed mobility including rolling L, L sidelying to supine and sit to stand all with sup/minA; performed to A with UE/LE dressing and personal hygiene. Requires increased time to perform due to slow performance of movements, fear avoidance behaviors, frequent rest breaks/position changes due to pain, and pt's hyper verbal nature. Pt instructed in gait training x3 trials of 30-40' using RW and minA for tactile cues on B hips for weight shifting, and verbal cues for increased step length and navigation. Pt reports increased discomfort and fatigue with gait, reporting she is able to tolerate less than previous session. Pt instructed in LE strengthening exercise of 6 reps of sit <-> stand on EOB. Requires repetitive verbal/tactile cues for increased forward trunk lean and progression of weight over LEs to A with standing; pt fearful of moving COM over feet however was able to improve performance and reduce reliance on UEs  pushing on EOB and therapist assistance by last 2 reps. Pt highly fatigued during this activity; unable to determine if physically challenging or if pt is requesting rest breaks due to nervousness. Pt instructed in standing balance activities with dynamic UE reaching activity to table at chest height; requires fluctuating max/totalA due to poor standing balance and postural alignment consistent with BLE neuropathy and visual impairments. Pt very fearful of standing unsupported and frequently reaches for nearby objects even when object is not stable to support weight. Pt seated in w/c semi-reclined with all needs within reach.    Therapy Documentation Precautions:  Precautions Precautions: Fall, Back Required Braces or Orthoses: Spinal Brace Spinal Brace: Thoracolumbosacral orthotic Other Brace/Splint: Ordered PRN Restrictions Weight Bearing Restrictions: No Pain: Pain Assessment Pain Assessment: 0-10 Pain Score: 8  Pain Type: Acute pain Pain Location: Sacrum Pain Orientation: Mid Pain Descriptors / Indicators: Aching Pain Onset: On-going Patients Stated Pain Goal: 6 Pain Intervention(s): Repositioned;Other (Comment) (pre-medicated) Multiple Pain Sites: No  See FIM for current functional status  Therapy/Group: Individual Therapy  Luberta Mutter 05/30/2015, 3:10 PM

## 2015-05-31 ENCOUNTER — Inpatient Hospital Stay (HOSPITAL_COMMUNITY): Payer: Medicare Other | Admitting: Occupational Therapy

## 2015-05-31 ENCOUNTER — Inpatient Hospital Stay (HOSPITAL_COMMUNITY): Payer: Medicare Other

## 2015-05-31 ENCOUNTER — Inpatient Hospital Stay (HOSPITAL_COMMUNITY): Payer: Medicare Other | Admitting: Physical Therapy

## 2015-05-31 LAB — CREATININE, SERUM
Creatinine, Ser: 0.48 mg/dL (ref 0.44–1.00)
GFR calc Af Amer: >60 mL/min (ref >60)

## 2015-05-31 NOTE — Discharge Instructions (Signed)
Inpatient Rehab Discharge Instructions  Jamie [name] Discharge date and time:    Activities/Precautions/ Functional Status: Activity: activity as tolerated Diet: regular diet Wound Care: none needed Functional status:  ___ No restrictions     ___ Walk up steps independently ___ 24/7 supervision/assistance   ___ Walk up steps with assistance ___ Intermittent supervision/assistance  ___ Bathe/dress independently ___ Walk with walker     ___ Bathe/dress with assistance ___ Walk Independently    ___ Shower independently ___ Walk with assistance    ___ Shower with assistance ___ No alcohol     ___ Return to work/school ________  Special Instructions:    My questions have been answered and I understand these instructions. I will adhere to these goals and the provided educational materials after my discharge from the hospital.  Patient/Caregiver Signature _______________________________ Date __________  Clinician Signature _______________________________________ Date __________  Please bring this form and your medication list with you to all your follow-up doctor's appointments.

## 2015-05-31 NOTE — Progress Notes (Signed)
Occupational Therapy Session Note  Patient Details  Name: Jamie [name] MRN: [id number] Date of Birth: 12-09-1956  Today's Date: 05/31/2015 OT Individual Time: 0950-1050 OT Individual Time Calculation (min): 60 min    Short Term Goals: Week 1:  OT Short Term Goal 1 (Week 1): Pt will complete bathing at mod assist level from EOB OT Short Term Goal 2 (Week 1): Pt will complete LB dressing at sit > stand level with max assist OT Short Term Goal 3 (Week 1): Pt will complete toilet transfer with RW with min assist OT Short Term Goal 4 (Week 1): Pt will complete shower transfer with RW with mod assist  Skilled Therapeutic Interventions/Progress Updates:    Engaged in ADL retraining with focus on sit <> stand, standing tolerance, and increased participation in dressing tasks.  Pt in bed upon arrival requiring increased time and cues to encourage pt to get OOB for therapy session.  Stand step transfer with RW with min assist and cues for larger steps as pt continues to have short shuffling gait.  Grooming completed in sitting and sink with cues to locate items on sink due to pt requesting to attempt to locate items but requiring increased time due to visual impairments and ?cognitive impairments with inability to recall where she had just placed item.  Bathing completed at sit > stand level with therapist providing assist to wash buttocks after incontinent BM with no awareness.  Pt able to complete UB dressing with setup assist and min cues for orientation of bra.  LB dressing pt able to pull pants over hips with max encouragement as pt reports fearfulness in standing.  Pt left tilted back in tilt-in-space w/c to promote increased OOB tolerance.  Therapy Documentation Precautions:  Precautions Precautions: Fall, Back Required Braces or Orthoses: Spinal Brace Spinal Brace: Thoracolumbosacral orthotic Other Brace/Splint: Ordered PRN Restrictions Weight Bearing Restrictions: No Pain: Pain  Assessment Pain Assessment: 0-10 Pain Score:  (patient reports better) Pain Type: Acute pain Pain Location: Sacrum Pain Orientation: Mid Pain Descriptors / Indicators: Aching Pain Onset: On-going Pain Intervention(s): Medication (See eMAR)  See FIM for current functional status  Therapy/Group: Individual Therapy  Simonne Come 05/31/2015, 12:28 PM

## 2015-05-31 NOTE — Plan of Care (Signed)
Problem: SCI BOWEL ELIMINATION Goal: RH STG MANAGE BOWEL WITH ASSISTANCE STG Manage Bowel with mod. Assistance.  Outcome: Not Progressing Report can feel at times. May be incont- require brief change

## 2015-05-31 NOTE — Progress Notes (Signed)
Occupational Therapy Session Note  Patient Details  Name: Macrina Lehnert MRN: [id number] Date of Birth: 20-Jun-1956  Today's Date: 05/31/2015 OT Individual Time: 1410-1505 OT Individual Time Calculation (min): 55 min    Short Term Goals: Week 1:  OT Short Term Goal 1 (Week 1): Pt will complete bathing at mod assist level from EOB OT Short Term Goal 2 (Week 1): Pt will complete LB dressing at sit > stand level with max assist OT Short Term Goal 3 (Week 1): Pt will complete toilet transfer with RW with min assist OT Short Term Goal 4 (Week 1): Pt will complete shower transfer with RW with mod assist  Skilled Therapeutic Interventions/Progress Updates:    Engaged in therapeutic activity with focus on activity tolerance, BUE strengthening, and components of LB dressing.  Pt in regular w/c upon arrival reporting walking approx 140 feet during PT session.  Propelled w/c approx 100 feet for BUE strengthening with mod cues for technique.  In therapy gym engaged in forward reaching task as needed to increase tolerance of reaching to complete LB dressing tasks.  Progressed to simulate LB dressing with theraband, having pt thread BLE then stand to pull up over hips.  Min steady assist for standing balance during task.  Pt able to recall 2 of 3 BUE strengthening exercises and with initial cue able to recall 3rd.  Pt completed 1 set of 10 each exercise to promote recall of proper technique.  Pt returned to room via w/c and encouraged to remain seated in standard w/c until next session.  Therapy Documentation Precautions:  Precautions Precautions: Fall, Back Required Braces or Orthoses: Spinal Brace Spinal Brace: Thoracolumbosacral orthotic Other Brace/Splint: Ordered PRN Restrictions Weight Bearing Restrictions: No Pain: Pain Assessment Pain Assessment: 0-10 Pain Score: 7  Pain Type: Acute pain Pain Location: Sacrum Pain Orientation: Mid Pain Descriptors / Indicators: Aching Pain Onset:  On-going Patients Stated Pain Goal: 5 Pain Intervention(s): Repositioned;Ambulation/increased activity Multiple Pain Sites: No  See FIM for current functional status  Therapy/Group: Individual Therapy  Simonne Come 05/31/2015, 3:09 PM

## 2015-05-31 NOTE — Progress Notes (Signed)
Physical Therapy Session Note  Patient Details  Name: Jamie [name] MRN: [id number] Date of Birth: May 28, 1956  Today's Date: 05/31/2015 PT Individual Time: 1300-1400 PT Individual Time Calculation (min): 60 min   Short Term Goals: Week 1:  PT Short Term Goal 1 (Week 1): Pt will perform bed mobility (R/L rolling, scooting to [facility]) with supervision and min cues PT Short Term Goal 2 (Week 1): Pt will perform bed <-> w/c transfer with supervision and min cues for positioning/placement due to visual impairments PT Short Term Goal 3 (Week 1): Pt will perform ambulation with RW x75 ft with min guard and min cues for navigation PT Short Term Goal 4 (Week 1): Pt will perform ascent/descent of 6 3-inch stair with minA and min verbal cues PT Short Term Goal 5 (Week 1): Pt will improve sitting tolerance in tilt-in-space w/c to 30 min in reclined position  Skilled Therapeutic Interventions/Progress Updates:    Pt received sitting semi-reclined in tilt-in-space w/c with c/o pain as described below; pt agreeable to treatment. Pt instructed in standing balance/endurance activity while standing at sink and brushing teeth; pt requires UE support on sink to maintain balance. Pt instructed in gait training for total 225 ft with seated rest break due to fatigue after 1103ft; performed x2 trials walking to and back from therapy gym. Pt demonstrating improved gait speed and step length with tactile facilitation for weight shifting and trunk rotation as well as verbal cues to step over imaginary objects. Gait quality returns to prior performance during turns, as patient has difficulty managing RW and coordinating UE/LE movements during turns or prior to sitting. In therapy gym pt instructed on Nustep with BUEs and BLEs on level 5 for 11 min at average 30 steps/min; activity performed to improve LE strength, ROM and activity tolerance for carryover into gait. Pt reports enjoying this activity and is surprised she is not more  fatigued after finishing. Pt returned to room (second gait trial described above) and seated in standard w/c. Pt educated on importance of increasing tolerance to upright sitting to improve core stability, breathing, and independence with mobility; pt verbalizes understanding. Pt remained in w/c and all needs within reach.   Therapy Documentation Precautions:  Precautions Precautions: Fall, Back Required Braces or Orthoses: Spinal Brace Spinal Brace: Thoracolumbosacral orthotic Other Brace/Splint: Ordered PRN Restrictions Weight Bearing Restrictions: No Pain: Pain Assessment Pain Assessment: 0-10 Pain Score: 7  Pain Type: Acute pain Pain Location: Sacrum Pain Orientation: Mid Pain Descriptors / Indicators: Aching Pain Onset: On-going Patients Stated Pain Goal: 5 Pain Intervention(s): Repositioned;Ambulation/increased activity Multiple Pain Sites: No Locomotion : Ambulation Ambulation/Gait Assistance: 4: Min assist   See FIM for current functional status  Therapy/Group: Individual Therapy  Luberta Mutter 05/31/2015, 2:19 PM

## 2015-05-31 NOTE — Progress Notes (Signed)
Physical Therapy Session Note  Patient Details  Name: Jamie [name] MRN: [id number] Date of Birth: Feb 18, 1956  Today's Date: 05/31/2015 PT Individual Time: 1532-1609 PT Individual Time Calculation (min): 37 min   Short Term Goals: Week 1:  PT Short Term Goal 1 (Week 1): Pt will perform bed mobility (R/L rolling, scooting to [facility]) with supervision and min cues PT Short Term Goal 2 (Week 1): Pt will perform bed <-> w/c transfer with supervision and min cues for positioning/placement due to visual impairments PT Short Term Goal 3 (Week 1): Pt will perform ambulation with RW x75 ft with min guard and min cues for navigation PT Short Term Goal 4 (Week 1): Pt will perform ascent/descent of 6 3-inch stair with minA and min verbal cues PT Short Term Goal 5 (Week 1): Pt will improve sitting tolerance in tilt-in-space w/c to 30 min in reclined position  Skilled Therapeutic Interventions/Progress Updates:    Patient in wheelchair in room.  Sit to stand minguard assist to walker.  Ambulated to dayroom 110' min assist cues for stride length, foot clearance on carpet due to shuffling pattern.  Seated in armchair in dayroom with RT drumming on table working on sitting tolerance with forward weight shift.  Patient performed standing hip flexion with walker support x 20, toe raises x 20 in standing.  Unable to do heel raises in standing so performed seated in w/c x 20 reps.  Patient assisted in wheelchair back to room.  Stand pivot to recliner wheelchair at her request and reclined with all needs in reach and RN in room for meds.  Therapy Documentation Precautions:  Precautions Precautions: Fall, Back Required Braces or Orthoses: Spinal Brace Spinal Brace: Thoracolumbosacral orthotic Other Brace/Splint: Ordered PRN Restrictions Weight Bearing Restrictions: No Pain: Pain Assessment Pain Assessment: 0-10 Pain Score: 7  Faces Pain Scale: Hurts little more Pain Type: Acute pain Pain Location: Sacrum Pain  Orientation: Mid Pain Descriptors / Indicators: Aching Pain Onset: On-going Patients Stated Pain Goal: 5 Pain Intervention(s): Splinting;Repositioned Multiple Pain Sites: No Locomotion : Ambulation Ambulation/Gait Assistance: 4: Min assist   See FIM for current functional status  Therapy/Group: Individual Therapy  Sayed Apostol,CYNDI 05/31/2015, 4:40 PM

## 2015-05-31 NOTE — Progress Notes (Signed)
Subjective/Complaints: Patient without severe pain complaints at the current time. She has not tried voiding without full catheter.  Still has poor sensation for bowel movements. Does get good results with Dulcolax suppository ROS: Pt denies fever, rash/itching, headache, blurred or double vision, nausea, vomiting, abdominal pain, diarrhea, chest pain, shortness of breath, palpitations, dysuria, dizziness,   bleeding, anxiety, or depression   Objective: Vital Signs: Blood pressure 112/69, pulse 89, temperature 98.3 F (36.8 C), temperature source Oral, resp. rate 18, height 5' 4"  (1.626 m), weight 57.9 kg (127 lb 10.3 oz), SpO2 100 %. No results found. Results for orders placed or performed during the hospital encounter of 05/24/15 (from the past 72 hour(s))  Creatinine, serum     Status: None   Collection Time: 05/31/15  7:00 AM  Result Value Ref Range   Creatinine, Ser 0.48 0.44 - 1.00 mg/dL   GFR calc non Af Amer >60 >60 mL/min   GFR calc Af Amer >60 >60 mL/min    Comment: (NOTE) The eGFR has been calculated using the CKD EPI equation. This calculation has not been validated in all clinical situations. eGFR's persistently <60 mL/min signify possible Chronic Kidney Disease.      HEENT: normal Cardio: RRR and no murmur Resp: CTA B/L and unlabored GI: BS positive and nontender nondistended Extremity:  No Edema Skin:   Intact Neuro: Alert/Oriented with good insight and awareness. Abnormal Sensory reduced sensation persistent in the lower sacral dermatomes. Weakness with toe flexion and extension,  normal ankle plantar flexion dorsiflexion as well as hip flexion and knee extension. Blind.  Musc/Skel:  LB tender Gen. no acute distress, frail appearing   Assessment/Plan: 1. Functional deficits secondary to sacral fracture with sacral nerve root injury causing neurogenic bowel and bladder as well as distal lower extremity numbness and weakness which require 3+ hours per day of  interdisciplinary therapy in a comprehensive inpatient rehab setting. Physiatrist is providing close team supervision and 24 hour management of active medical problems listed below. Physiatrist and rehab team continue to assess barriers to discharge/monitor patient progress toward functional and medical goals. FIM: FIM - Bathing Bathing Steps Patient Completed: Chest, Right Arm, Left Arm, Abdomen, Front perineal area, Right upper leg, Left upper leg Bathing: 3: Mod-Patient completes 5-7 43f10 parts or 50-74%  FIM - Upper Body Dressing/Undressing Upper body dressing/undressing steps patient completed: Thread/unthread right bra strap, Thread/unthread left bra strap, Hook/unhook bra, Thread/unthread right sleeve of pullover shirt/dresss, Thread/unthread left sleeve of pullover shirt/dress, Put head through opening of pull over shirt/dress, Pull shirt over trunk Upper body dressing/undressing: 5: Set-up assist to: Obtain clothing/put away FIM - Lower Body Dressing/Undressing Lower body dressing/undressing steps patient completed: Thread/unthread left pants leg, Thread/unthread right pants leg, Fasten/unfasten left shoe, Fasten/unfasten right shoe Lower body dressing/undressing: 1: Total-Patient completed less than 25% of tasks  FIM - Toileting Toileting steps completed by patient: Adjust clothing prior to toileting, Performs perineal hygiene Toileting Assistive Devices: Grab bar or rail for support Toileting: 3: Mod-Patient completed 2 of 3 steps  FIM - TRadio producerDevices: Grab bars Toilet Transfers: 4-To toilet/BSC: Min A (steadying Pt. > 75%), 4-From toilet/BSC: Min A (steadying Pt. > 75%)  FIM - Bed/Chair Transfer Bed/Chair Transfer Assistive Devices: Walker, HOB elevated Bed/Chair Transfer: 5: Supine > Sit: Supervision (verbal cues/safety issues), 5: Sit > Supine: Supervision (verbal cues/safety issues), 4: Bed > Chair or W/C: Min A (steadying Pt. > 75%), 4:  Chair or W/C > Bed: Min A (  steadying Pt. > 75%)  FIM - Locomotion: Wheelchair Locomotion: Wheelchair: 1: Total Assistance/staff pushes wheelchair (Pt<25%) FIM - Locomotion: Ambulation Locomotion: Ambulation Assistive Devices: Administrator Ambulation/Gait Assistance: 4: Min assist Locomotion: Ambulation: 2: Travels 50 - 149 ft with minimal assistance (Pt.>75%)  Comprehension Comprehension Mode: Auditory Comprehension: 5-Understands basic 90% of the time/requires cueing < 10% of the time  Expression Expression Mode: Verbal Expression: 5-Expresses basic 90% of the time/requires cueing < 10% of the time.  Social Interaction Social Interaction: 5-Interacts appropriately 90% of the time - Needs monitoring or encouragement for participation or interaction.  Problem Solving Problem Solving: 4-Solves basic 75 - 89% of the time/requires cueing 10 - 24% of the time  Memory Memory: 5-Recognizes or recalls 90% of the time/requires cueing < 10% of the time  Medical Problem List and Plan: 1. 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  2. DVT Prophylaxis/Anticoagulation: Pharmaceutical: Lovenox 3. Pain Management: resumed Oxycodone. Continue lidoderm  -gabapentin    -may bring in cervical pillow for comfort 4. Mood: Continue prozac.  5. Neuropsych: This patient is capable of making decisions on her own behalf. 6. Skin/Wound Care: Routine pressure relief measure.   air mattress overlay to help with pressure relief as well as pain.  7. Fluids/Electrolytes/Nutrition: intake fair at present   -told her we could review her vitamin/supplements and find something somewhat equivalent to use from hospital formulary 8. Urinary retention: consider voiding trial next week. Also Continue flomax.  9. E. Coli UTI: Antibiotics complete. 10. Constipation: continue Miralax bid with suppository daily. 11. Neurosarcoidosis:  Legally blind.   12. Neurogenic bladder recommend voiding trial 13. Neurogenic bowel continue Dulcolax suppositories LOS (Days) 7 A FACE TO FACE EVALUATION WAS PERFORMED  KIRSTEINS,ANDREW E 05/31/2015, 8:09 AM

## 2015-06-01 ENCOUNTER — Inpatient Hospital Stay (HOSPITAL_COMMUNITY): Payer: Medicare Other | Admitting: Occupational Therapy

## 2015-06-01 ENCOUNTER — Inpatient Hospital Stay (HOSPITAL_COMMUNITY): Payer: Medicare Other | Admitting: Physical Therapy

## 2015-06-01 ENCOUNTER — Inpatient Hospital Stay (HOSPITAL_COMMUNITY): Payer: Medicare Other | Admitting: Speech Pathology

## 2015-06-01 NOTE — Plan of Care (Signed)
Problem: RH Car Transfers Goal: LTG Patient will perform car transfers with assist (PT) LTG: Patient will perform car transfers with assistance (PT).  Outcome: Not Applicable Date Met:  40/34/74 Goal discharged due to change in d/c location from home to SNF  Problem: RH Ambulation Goal: LTG Patient will ambulate in home environment (PT) LTG: Patient will ambulate in home environment, # of feet with assistance (PT).  Outcome: Not Applicable Date Met:  25/95/63 Goal discharged due to change in d/c location from home to SNF  Problem: RH Wheelchair Mobility Goal: LTG Patient will propel w/c in community environment (PT) LTG: Patient will propel wheelchair in community environment, # of feet with assist (PT)  Outcome: Not Applicable Date Met:  87/56/43 Goal discharged due to change in d/c location from home to SNF.

## 2015-06-01 NOTE — Progress Notes (Signed)
Subjective/Complaints: Per PT no pain during therapy, ambulated with walker, feels funny on the bottom of the feet   Still has poor sensation for bowel movements. Does get good results with Dulcolax suppository ROS: Pt denies fever, rash/itching, headache, blurred or double vision, nausea, vomiting, abdominal pain, diarrhea, chest pain, shortness of breath, palpitations, dysuria, dizziness,   bleeding, anxiety, or depression   Objective: Vital Signs: Blood pressure 98/57, pulse 79, temperature 98.2 F (36.8 C), temperature source Oral, resp. rate 17, height 5' 4"  (1.626 m), weight 60.646 kg (133 lb 11.2 oz), SpO2 99 %. No results found. Results for orders placed or performed during the hospital encounter of 05/24/15 (from the past 72 hour(s))  Creatinine, serum     Status: None   Collection Time: 05/31/15  7:00 AM  Result Value Ref Range   Creatinine, Ser 0.48 0.44 - 1.00 mg/dL   GFR calc non Af Amer >60 >60 mL/min   GFR calc Af Amer >60 >60 mL/min    Comment: (NOTE) The eGFR has been calculated using the CKD EPI equation. This calculation has not been validated in all clinical situations. eGFR's persistently <60 mL/min signify possible Chronic Kidney Disease.      HEENT: normal Cardio: RRR and no murmur Resp: CTA B/L and unlabored GI: BS positive and nontender nondistended Extremity:  No Edema Skin:   Intact Neuro: Alert/Oriented with good insight and awareness. Abnormal Sensory reduced sensation persistent in the lower sacral dermatomes. Weakness with toe flexion and extension,  normal ankle plantar flexion dorsiflexion as well as hip flexion and knee extension.upper extremity strength is 4+ bilaterally Blind.  Musc/Skel:  LB tender Gen. no acute distress, frail appearing   Assessment/Plan: 1. Functional deficits secondary to sacral fracture with sacral nerve root injury causing neurogenic bowel and bladder as well as distal lower extremity numbness and weakness which  require 3+ hours per day of interdisciplinary therapy in a comprehensive inpatient rehab setting. Physiatrist is providing close team supervision and 24 hour management of active medical problems listed below. Physiatrist and rehab team continue to assess barriers to discharge/monitor patient progress toward functional and medical goals. Team conference today please see physician documentation under team conference tab, met with team face-to-face to discuss problems,progress, and goals. Formulized individual treatment plan based on medical history, underlying problem and comorbidities. FIM: FIM - Bathing Bathing Steps Patient Completed: Chest, Right Arm, Left Arm, Abdomen, Right upper leg, Left upper leg Bathing: 3: Mod-Patient completes 5-7 56f10 parts or 50-74%  FIM - Upper Body Dressing/Undressing Upper body dressing/undressing steps patient completed: Thread/unthread right bra strap, Thread/unthread left bra strap, Hook/unhook bra, Thread/unthread right sleeve of pullover shirt/dresss, Thread/unthread left sleeve of pullover shirt/dress, Put head through opening of pull over shirt/dress, Pull shirt over trunk Upper body dressing/undressing: 5: Set-up assist to: Obtain clothing/put away FIM - Lower Body Dressing/Undressing Lower body dressing/undressing steps patient completed: Pull pants up/down Lower body dressing/undressing: 1: Total-Patient completed less than 25% of tasks  FIM - Toileting Toileting steps completed by patient: Adjust clothing prior to toileting, Performs perineal hygiene Toileting Assistive Devices: Grab bar or rail for support Toileting: 3: Mod-Patient completed 2 of 3 steps  FIM - TRadio producerDevices: Grab bars Toilet Transfers: 4-To toilet/BSC: Min A (steadying Pt. > 75%), 4-From toilet/BSC: Min A (steadying Pt. > 75%)  FIM - Bed/Chair Transfer Bed/Chair Transfer Assistive Devices: Walker, Arm rests Bed/Chair Transfer: 4: Chair or  W/C > Bed: Min A (steadying Pt. > 75%),  4: Bed > Chair or W/C: Min A (steadying Pt. > 75%)  FIM - Locomotion: Wheelchair Locomotion: Wheelchair: 1: Total Assistance/staff pushes wheelchair (Pt<25%) FIM - Locomotion: Ambulation Locomotion: Ambulation Assistive Devices: Administrator Ambulation/Gait Assistance: 4: Min assist Locomotion: Ambulation: 2: Travels 50 - 149 ft with minimal assistance (Pt.>75%)  Comprehension Comprehension Mode: Auditory Comprehension: 5-Understands basic 90% of the time/requires cueing < 10% of the time  Expression Expression Mode: Verbal Expression: 5-Expresses basic 90% of the time/requires cueing < 10% of the time.  Social Interaction Social Interaction: 5-Interacts appropriately 90% of the time - Needs monitoring or encouragement for participation or interaction.  Problem Solving Problem Solving: 4-Solves basic 75 - 89% of the time/requires cueing 10 - 24% of the time  Memory Memory: 5-Recognizes or recalls 90% of the time/requires cueing < 10% of the time  Medical Problem List and Plan: 1. 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  2. DVT Prophylaxis/Anticoagulation: Pharmaceutical: Lovenox 3. Pain Management: resumed Oxycodone. Continue lidoderm  -gabapentin    -may bring in cervical pillow for comfort 4. Mood: Continue prozac.  5. Neuropsych: This patient is capable of making decisions on her own behalf. 6. Skin/Wound Care: Routine pressure relief measure.   air mattress overlay to help with pressure relief as well as pain.  7. Fluids/Electrolytes/Nutrition: intake fair at present   - 8. Urinary retention: consider voiding trial next week. Also Continue flomax.  9. E. Coli UTI: Antibiotics complete. 10. Constipation: continue Miralax bid with suppository daily. 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   12. Neurogenic bladder recommend voiding trial, start today-  d/w pt strong possiblity of I/O cath 13. Neurogenic bowel continue Dulcolax suppositories LOS (Days) 8 A FACE TO FACE EVALUATION WAS PERFORMED  Osker Ayoub E 06/01/2015, 9:25 AM

## 2015-06-01 NOTE — Progress Notes (Signed)
Occupational Therapy Session Note  Patient Details  Name: Jamie [name] MRN: [id number] Date of Birth: 12/25/1955  Today's Date: 06/01/2015 OT Individual Time: 6811-5726 OT Individual Time Calculation (min): 42 min    Short Term Goals: Week 2:  OT Short Term Goal 1 (Week 2): Pt will complete shower transfer with RW with min assist OT Short Term Goal 2 (Week 2): Pt will complete bathing with min assist at sit > stand level OT Short Term Goal 3 (Week 2): Pt will complete LB dressing with min assist at sit > stand level OT Short Term Goal 4 (Week 2): Pt will tolerate standing for 5 mins without rest break to increase participation in ADLs  Skilled Therapeutic Interventions/Progress Updates:  Upon entering the room, pt finishing pet therapy session. Pt with 3/10 c/o pain in sacral area. Pt performed 2 sets of 20 bicep curls and lateral pull downs while seated in wheelchair with green, level 3 resistance theraband. Pt requiring rest breaks after each set. Her parents arrived with SW to discuss discharge options. OT provided education regarding rehab in SNF setting as far as expectations and goals for this setting. Pt verbalizing understanding and requesting d/c to SNF for short term rehab before returning to home. OT also discussed progress towards OT goals with pt and family members during this session. Pt remained seated in wheelchair with family present and call bell within reach.   Therapy Documentation Precautions:  Precautions Precautions: Fall, Back Required Braces or Orthoses: Spinal Brace Spinal Brace: Thoracolumbosacral orthotic Other Brace/Splint: Ordered PRN Restrictions Weight Bearing Restrictions: No  Pain: Pain Assessment Pain Assessment: 0-10 Pain Score: 7  Pain Type: Acute pain Pain Location: Sacrum Pain Orientation: Mid Pain Descriptors / Indicators: Aching Pain Onset: On-going Patients Stated Pain Goal: 5 Pain Intervention(s): Ambulation/increased activity Multiple  Pain Sites: No  See FIM for current functional status  Therapy/Group: Individual Therapy  Phineas Semen 06/01/2015, 2:50 PM

## 2015-06-01 NOTE — Patient Care Conference (Signed)
Inpatient RehabilitationTeam Conference and Plan of Care Update Date: 06/01/2015   Time: 10;55 AM    Patient Name: Jamie [name]      Medical Record Number: [id number]  Date of Birth: May 20, 1956 Sex: Female         Room/Bed: 4W03C/4W03C-01 Payor Info: Payor: MEDICARE / Plan: MEDICARE PART A AND B / Product Type: *No Product type* /    Admitting Diagnosis: sacral fracture with neuroenic bowel and bladder  Admit Date/Time:  05/24/2015  2:59 PM Admission Comments: No comment available   Primary Diagnosis:  Sacral nerve root injury Principal Problem: Sacral nerve root injury  Patient Active Problem List   Diagnosis Date Noted  . 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 Neurogenic bowel 05/24/2015  . 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 Hypotonic neurogenic bladder   . Leg weakness   . Sacral fracture, closed 05/21/2015  . 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 UTI (lower urinary tract infection) 05/21/2015  . Urinary retention 05/21/2015    Expected Discharge Date: Expected Discharge Date: 06/10/15  Team Members Present: Physician leading conference: Dr. Alysia Penna Social Worker Present: Ovidio Kin, LCSW Nurse Present: Heather Roberts, RN PT Present: Raylene Everts, PT;Other (comment);Georjean Mode, PT (Lizzie Tygielski-PT) OT Present: Simonne Come, Maryella Shivers, OT SLP Present: Windell Moulding, SLP PPS Coordinator present : Daiva Nakayama, RN, CRRN     Current Status/Progress Goal Weekly Team Focus  Medical   cognitive deficits, ?NPH  reduce fall risk  NS eval   Bowel/Bladder   Foley for urineary retention. Pt inont of bowel; neurogenic bowel and bladder  bladder managed with total assist and bowel managed with level 4 assist  attempt voiding trials   Swallow/Nutrition/ Hydration     na        ADL's   min-mod assist bathing, max assist LB dressing, setup UB dressing, min-mod assist transfers  min-supervision  pain management, sitting balance, standing balance, transfers, self-care  retraining   Mobility   minA/CGA for transfers and gait, mod/maxA standing balance without UE support, reduced endurance and pain   Supervision all transfers, gait, stairs; minA w/c due to visual impairments  activity tolerance/endurance, LE strength, standing balance   Communication     na        Safety/Cognition/ Behavioral Observations    no unsafe behaviors        Pain   using oxycodone PRN and lidocaine patches  Pain managed with medications at or below level 6  assess need for pain medications. assess effectivness   Skin   abraisons to right knee  no further injury min assist  relef pressure to sacrum frequently. assess skin q shift      *See Care Plan and progress notes for long and short-term goals.  Barriers to Discharge: family unable to help    Possible Resolutions to Barriers:  see above may need SNF    Discharge Planning/Teaching Needs:  Probably short term NHP due to will need care at discharge and has no one to provide this. Will meet with pt and parents this afternoon to discuss options      Team Discussion:  Pt is making progress slowly,pain more managed. SP to eval-cognitive issues. Have neuro-check NPH. DC foley today and see if can void. Working on a bowel program. Endurance better. Will need 24 hr care at discharge  Revisions to Treatment Plan:  Probable NHP   Continued Need for Acute Rehabilitation Level of Care: The patient requires daily medical management by a physician with  specialized training in physical medicine and rehabilitation for the following conditions: Daily direction of a multidisciplinary physical rehabilitation program to ensure safe treatment while eliciting the highest outcome that is of practical value to the patient.: Yes Daily medical management of patient stability for increased activity during participation in an intensive rehabilitation regime.: Yes Daily analysis of laboratory values and/or radiology reports with any subsequent need  for medication adjustment of medical intervention for : Neurological problems;Other  [name] 06/02/2015, 8:44 AM

## 2015-06-01 NOTE — Progress Notes (Signed)
Recreational Therapy Session Note  Patient Details  Name: Jamie [name] MRN: [id number] Date of Birth: 08-20-1956 Today's Date: 06/01/2015  Pain: no c/o Skilled Therapeutic Interventions/Progress Updates: Pt participated in animal assisted activity/therapy seated w/c level with supervision/set up & verbal & tactile cues.  Pt stated appreciation for the interaction.   Therapy/Group: Individual Therapy Dottie Vaquerano,[name] 06/01/2015, 3:32 PM

## 2015-06-01 NOTE — Progress Notes (Addendum)
Physical Therapy Weekly Progress Note  Patient Details  Name: Jamie [name] MRN: [id number] Date of Birth: 1956-08-10  Beginning of progress report period: May 25, 2015 End of progress report period: June 01, 2015   Patient has met 4 of 5 short term goals.  Unmet goal includes performance of bed mobility with supervision; not met due to core strength deficits and poor initiation of rolling with UEs/LEs. Overall pt minA for mobility due to poor balance and activity tolerance, and requires min/mod cueing due to visual impairment.   Patient continues to demonstrate the following deficits: impaired standing balance, reduced activity tolerance, impaired coordination, ROM deficits, strength deficits, pain, and therefore will continue to benefit from skilled PT intervention to enhance overall performance with activity tolerance, balance, postural control, ability to compensate for deficits, awareness and coordination.  Patient progressing toward long term goals..  Plan of care revisions: Discharged home ambulation, car transfer, and community w/c mobility goals due to change in d/c location. .  PT Short Term Goals Week 1:  PT Short Term Goal 1 (Week 1): Pt will perform bed mobility (R/L rolling, scooting to [facility]) with supervision and min cues PT Short Term Goal 1 - Progress (Week 1): Not met PT Short Term Goal 2 (Week 1): Pt will perform bed <-> w/c transfer with supervision and min cues for positioning/placement due to visual impairments PT Short Term Goal 2 - Progress (Week 1): Met PT Short Term Goal 3 (Week 1): Pt will perform ambulation with RW x75 ft with min guard and min cues for navigation PT Short Term Goal 3 - Progress (Week 1): Met PT Short Term Goal 4 (Week 1): Pt will perform ascent/descent of 6 3-inch stair with minA and min verbal cues PT Short Term Goal 4 - Progress (Week 1): Met PT Short Term Goal 5 (Week 1): Pt will improve sitting tolerance in tilt-in-space w/c to 30 min in reclined  position PT Short Term Goal 5 - Progress (Week 1): Met  Week 2:  PT Short Term Goal 1 (Week 2): Pt will perform bed mobility (R/L rolling, scooting to [facility]) with supervision and min cues PT Short Term Goal 2 (Week 2): Pt will perform bed <> w/c transfer with supervision and min cues for navigation due to visual impairments PT Short Term Goal 3 (Week 2): Pt will perform gait with LRAD x250' with supervision and min cues for navigation due to visual impairments PT Short Term Goal 4 (Week 2): Pt will perform ascent/descent of 6 6-inch stairs with minA and min cues PT Short Term Goal 5 (Week 2): Pt will improve sitting tolerance in standard w/c to 2 hrs       Therapy Documentation Precautions:  Precautions Precautions: Fall, Back Required Braces or Orthoses: Spinal Brace Spinal Brace: Thoracolumbosacral orthotic Other Brace/Splint: Ordered PRN Restrictions Weight Bearing Restrictions: No  See FIM for current functional status   Luberta Mutter 06/01/2015, 4:31 PM

## 2015-06-01 NOTE — Progress Notes (Signed)
Social Work Patient ID: Jamie [name], female   DOB: 06-20-1956, 59 y.o.   MRN: [id number] Met with pt and her parents who were here to discuss team conference goals-supervision/min level of assist and discharge 6/24.  Discussed options of hiring assistance versus Going to a NH for a short time. Pt has decided to go to a NH for a short time then hopefully return to her apartment.  She will need to hire assist when she returns to her apartment. Parents are respectful of pt knowing it is her decision, but also unrealistic regarding long term goals.  Discussed she may ned assisted living after discharge from NH.  Both felt she will Be fine in her apartment. Have given them a NH list they have chosen [facility] and want worker to pursue this. Begin NH process.

## 2015-06-01 NOTE — Evaluation (Signed)
Speech Language Pathology Assessment and Plan  Patient Details  Name: Jamie [name] MRN: [id number] Date of Birth: Aug 11, 1956  SLP Diagnosis: Cognitive Impairments  Rehab Potential: Good ELOS: TBD due to SNF placement     Today's Date: 06/01/2015 SLP Individual Time: 3474-2595 SLP Individual Time Calculation (min): 60 min   Problem List:  Patient Active Problem List   Diagnosis Date Noted  . 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 Neurogenic bowel 05/24/2015  . 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 Hypotonic neurogenic bladder   . Leg weakness   . Sacral fracture, closed 05/21/2015  . 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 UTI (lower urinary tract infection) 05/21/2015  . Urinary retention 05/21/2015   Past Medical History:  Past Medical History  Diagnosis Date  . 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 Neurosarcoidosis in adult     followed at [facility]  . 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 Progressive gait disorder     Was treated for probable MS until diagnosed with Neurosarcoidosis about 10 years ago.    Past Surgical History: No past surgical history on file.  Assessment / Plan / Recommendation Clinical Impression Patient is a 59 year old female with h/o  neurosarcoidosis with diffuse peripheral neuropathy, progressive gait disorder and significant visual impairment;  who sustained a fall on 05/09/15 during process of moving to [place]. Since that episode she has had multiple falls with bladder and bowel incontinence and difficulty walking. She was evaluated in ED on 5/26 with negative work up. She was re-admitted on 05/20/15 recurrent fall, HA and sacral pain.  CT head revealed advanced centralized atrophy with ex vacuo dilation of ventricular system and was negative for acute changes. MRI Lumbar spine showed sacral fracture S1-S2 with mild angulation and some edema and hemorrhage in the area. No surgical intervention needed per Dr. Kathyrn Sheriff.  Thoracic spine  MRI demonstrated a small T10 superior endplate compression which appeared acute. Cervical spine MRI showed no compressive lesions no evidence of fractures. Dr. Lorin Mercy consulted and advised conservative care with TLSO for support. Neurology consulted for instability of gait with workup felt to be suspect nonfocal concussion like trauma to the spinal cord as no compressive lesions noted. She has had issues with neuropathy BLE as well as urinary retention requiring foley placement. She was started on rocephin for E coli UTI.  She failed voiding trial on 06/06 therefore foley was replaced. She has had improvement in BLE strength but continue to be limited by significant sacral pain. Patient was transferred to CIR on 05/24/15 and has been participating in therapy. PT/OT report cognitive impairments throughout sessions, therefore, a cognitive-linguistic evaluation was administered. Patient was administered the MoCA-Blind and scored a 20/22 with a score of 18 or above considered normal. However, patient demonstrated decreased sustained attention and thought organization with verbosity and tangents with limited awareness throughout the session. Patient would benefit from skilled SLP intervention to maximize her cognitive function and overall functional independence prior to discharge.   Skilled Therapeutic Interventions          Administered a cognitive-linguistic evaluation. Please see above for details. Educated patient in regards to her current cognitive-linguistic function and goals of skilled SLP intervention, she verbalized understanding.   SLP Assessment  Patient will need skilled [place] [facility] during CIR admission    Recommendations  Oral Care Recommendations: Oral care BID Recommendations for Other Services: Neuropsych consult Patient destination: [place] (SNF) Follow up Recommendations: 24 hour supervision/assistance;Skilled  Nursing facility Equipment Recommended: None  recommended by SLP    SLP Frequency 3 to 5 out of 7 days   SLP Treatment/Interventions Cognitive remediation/compensation;Cueing hierarchy;Functional tasks;Environmental controls;Internal/external aids;Patient/family education;Therapeutic Activities    Pain   Prior Functioning    Short Term Goals: Week 1: SLP Short Term Goal 1 (Week 1): Patient will demonstrate selective attention in a mildly distracting enviornment for 30 minutes with supervision verbal cues for redirection.  SLP Short Term Goal 2 (Week 1): Patient will self-monitor and correct verbosity during a functional conversation with supervision question cues.  SLP Short Term Goal 3 (Week 1): Patient will maintain a topic of conversation for ~3 turns with supervision verbal and question cues.  SLP Short Term Goal 4 (Week 1): Patient will perform basic problem solving for functional and familiar tasks with supervision verbal and question cues.   See FIM for current functional status Refer to Care Plan for Long Term Goals  Recommendations for other services: Neuropsych  Discharge Criteria: Patient will be discharged from SLP if patient refuses treatment 3 consecutive times without medical reason, if treatment goals not met, if there is a change in medical status, if patient makes no progress towards goals or if patient is discharged from hospital.  The above assessment, treatment plan, treatment alternatives and goals were discussed and mutually agreed upon: by patient  Markey Deady 06/01/2015, 4:56 PM

## 2015-06-01 NOTE — Progress Notes (Signed)
Occupational Therapy Weekly Progress Note  Patient Details  Name: Jamie [name] MRN: [id number] Date of Birth: 04-19-1956  Beginning of progress report period: May 25, 2015 End of progress report period: June 01, 2015  Today's Date: 06/01/2015 OT Individual Time: 0932-3557 OT Individual Time Calculation (min): 60 min    Patient has met 3 of 4 short term goals.  Pt is making steady progress towards goals.  She currently requires min assist with ambulation and transfers due to impairments in gait and visual impairments as well as pain.  Pt requires cues for initiation with mobility tasks secondary to pain and fearfulness of pain and falling. Pt has progressed from bed level tasks to tolerating sitting EOB to tolerating OOB in standard w/c for short periods of time.  Pt has not attempted shower transfer or bathing at shower level secondary to fearfulness.  Recommend speech eval secondary to cognitive impairments impacting participation in self-care tasks.  Patient continues to demonstrate the following deficits: pain in sacral area limiting mobility, decreased activity tolerance, muscle weakness, impaired timing and sequencing, progressive blindness and decreased sitting balance, decreased standing balance and decreased balance strategies and therefore will continue to benefit from skilled OT intervention to enhance overall performance with BADL and Reduce care partner burden.  Patient progressing toward long term goals..  Continue plan of care.  OT Short Term Goals Week 1:  OT Short Term Goal 1 (Week 1): Pt will complete bathing at mod assist level from EOB OT Short Term Goal 1 - Progress (Week 1): Met OT Short Term Goal 2 (Week 1): Pt will complete LB dressing at sit > stand level with max assist OT Short Term Goal 2 - Progress (Week 1): Met OT Short Term Goal 3 (Week 1): Pt will complete toilet transfer with RW with min assist OT Short Term Goal 3 - Progress (Week 1): Met OT Short Term Goal 4  (Week 1): Pt will complete shower transfer with RW with mod assist OT Short Term Goal 4 - Progress (Week 1): Not met Week 2:  OT Short Term Goal 1 (Week 2): Pt will complete shower transfer with RW with min assist OT Short Term Goal 2 (Week 2): Pt will complete bathing with min assist at sit > stand level OT Short Term Goal 3 (Week 2): Pt will complete LB dressing with min assist at sit > stand level OT Short Term Goal 4 (Week 2): Pt will tolerate standing for 5 mins without rest break to increase participation in ADLs  Skilled Therapeutic Interventions/Progress Updates:    Engaged in ADL retraining with focus on functional transfers, sit <> stand, standing tolerance, and increased participation in LB dressing.  Pt finishing breakfast upon arrival requiring increased time due to impaired vision.  Bathing and dressing completed seated at sink with focus on increased participation with LB dressing and increased standing tolerance.  Pt requires increased cues to locate items due to impaired vision and impaired sequencing.  Pt tolerated standing approx 3 mins while therapist assisted with perineal hygiene post incontinent BM.  Pt able to carryover education with LB dressing from prior sessions, still requiring cues for technique and assist to cross BLE over opposite knee to thread pants.    Therapy Documentation Precautions:  Precautions Precautions: Fall, Back Required Braces or Orthoses: Spinal Brace Spinal Brace: Thoracolumbosacral orthotic Other Brace/Splint: Ordered PRN Restrictions Weight Bearing Restrictions: No General:   Vital Signs: Therapy Vitals Temp: 98.2 F (36.8 C) Temp Source: Oral Pulse Rate: 79 Resp:  17 BP: (!) 98/57 mmHg Patient Position (if appropriate): Sitting Oxygen Therapy SpO2: 99 % O2 Device: Not Delivered Pain: Pain Assessment Pain Assessment: 0-10 Pain Score: 7  Pain Location: Sacrum Pain Orientation: Mid Pain Intervention(s):  Repositioned;Ambulation/increased activity   See FIM for current functional status  Therapy/Group: Individual Therapy  Simonne Come 06/01/2015, 8:09 AM

## 2015-06-01 NOTE — Progress Notes (Signed)
Physical Therapy Session Note  Patient Details  Name: Jamie [name] MRN: [id number] Date of Birth: [birth year]  Today's Date: 06/01/2015 PT Individual Time: 0900-0945 PT Individual Time Calculation (min): 45 min   Short Term Goals: Week 1:  PT Short Term Goal 1 (Week 1): Pt will perform bed mobility (R/L rolling, scooting to [facility]) with supervision and min cues PT Short Term Goal 2 (Week 1): Pt will perform bed <-> w/c transfer with supervision and min cues for positioning/placement due to visual impairments PT Short Term Goal 3 (Week 1): Pt will perform ambulation with RW x75 ft with min guard and min cues for navigation PT Short Term Goal 4 (Week 1): Pt will perform ascent/descent of 6 3-inch stair with minA and min verbal cues PT Short Term Goal 5 (Week 1): Pt will improve sitting tolerance in tilt-in-space w/c to 30 min in reclined position  Skilled Therapeutic Interventions/Progress Updates:    Pt received seated in w/c with reports of sacral pain as described below; agreeable to treatment. Pt instructed in gait training for approx 245ft using RW and supervision. Therapist provided variable manual facilitation to inc weight shifting to inc step length, with reduction of cues with mod carryover when performed independently. Pt instructed in gait training in parallel bars with min UE support on bars for 2 trials of forward, backward, sideways and grapevine walking. Pt requires repetitive cues for upright posture and to reduce forward trunk lean and hip flexion. Demonstrates reduced coordination and single leg balance during sideways and grapevine walking. Performed BLE hamstring stretch with contract/relax PNF pattern to improve muscle length for carryover into improved posture. Pt reports being too fatigued to ambulate back to room and requests to return to room in w/c. Pt transferred to w/c with minA. Handoff to speech therapist Loma Sousa.   Therapy Documentation Precautions:   Precautions Precautions: Fall, Back Required Braces or Orthoses: Spinal Brace Spinal Brace: Thoracolumbosacral orthotic Other Brace/Splint: Ordered PRN Restrictions Weight Bearing Restrictions: No Pain: Pain Assessment Pain Assessment: 0-10 Pain Score: 7  Pain Type: Acute pain Pain Location: Sacrum Pain Orientation: Mid Pain Descriptors / Indicators: Aching Pain Onset: On-going Patients Stated Pain Goal: 5 Pain Intervention(s): Ambulation/increased activity Multiple Pain Sites: No Locomotion : Ambulation Ambulation/Gait Assistance: 4: Min guard   See FIM for current functional status  Therapy/Group: Individual Therapy  Luberta Mutter 06/01/2015, 11:11 AM

## 2015-06-02 ENCOUNTER — Inpatient Hospital Stay (HOSPITAL_COMMUNITY): Payer: Medicare Other

## 2015-06-02 ENCOUNTER — Encounter (HOSPITAL_COMMUNITY): Payer: Medicare Other

## 2015-06-02 ENCOUNTER — Inpatient Hospital Stay (HOSPITAL_COMMUNITY): Payer: Medicare Other | Admitting: Physical Therapy

## 2015-06-02 ENCOUNTER — Inpatient Hospital Stay (HOSPITAL_COMMUNITY): Payer: Medicare Other | Admitting: Occupational Therapy

## 2015-06-02 ENCOUNTER — Inpatient Hospital Stay (HOSPITAL_COMMUNITY): Payer: Medicare Other | Admitting: Speech Pathology

## 2015-06-02 NOTE — Progress Notes (Signed)
Speech Language Pathology Daily Session Note  Patient Details  Name: Jamie [name] MRN: [id number] Date of Birth: 1956-01-10  Today's Date: 06/02/2015 SLP Individual Time: 8295-6213 SLP Individual Time Calculation (min): 29 min  Short Term Goals: Week 1: SLP Short Term Goal 1 (Week 1): Patient will demonstrate selective attention in a mildly distracting enviornment for 30 minutes with supervision verbal cues for redirection.  SLP Short Term Goal 2 (Week 1): Patient will self-monitor and correct verbosity during a functional conversation with supervision question cues.  SLP Short Term Goal 3 (Week 1): Patient will maintain a topic of conversation for ~3 turns with supervision verbal and question cues.  SLP Short Term Goal 4 (Week 1): Patient will perform basic problem solving for functional and familiar tasks with supervision verbal and question cues.   Skilled Therapeutic Interventions:  Pt was seen for skilled ST targeting cognitive goals.  Upon arrival, pt was seated upright in wheelchair, awake, alert, and agreeable to participate in [place].  SLP facilitated the session with functional conversations regarding pt's personal interests, during which pt required intermittent min assist verbal cues for redirection to conversation due to tangents.  When SLP discussed pt's verbosity and tangents, pt reports that that is part of her baseline personality.  SLP provided skilled education regarding current goals for ST to facilitate carryover of PT/OT recommendations in between therapy sessions as pt did not recall results from yesterday's ST evaluation.  SLP particularly emphasized how pt's tangentiality could limit her participation in other therapies.  Furthermore, pt required min assist verbal/question cues for recall of daily events (i.e. Sequence of therapies, names of therapists).  Pt was left upright in wheelchair with all needs left within reach.  Continue per current plan of care.      FIM:   Comprehension Comprehension Mode: Auditory Comprehension: 5-Understands basic 90% of the time/requires cueing < 10% of the time Expression Expression Mode: Verbal Expression: 5-Expresses complex 90% of the time/cues < 10% of the time Social Interaction Social Interaction: 5-Interacts appropriately 90% of the time - Needs monitoring or encouragement for participation or interaction. Problem Solving Problem Solving: 4-Solves basic 75 - 89% of the time/requires cueing 10 - 24% of the time Memory Memory: 4-Recognizes or recalls 75 - 89% of the time/requires cueing 10 - 24% of the time FIM - Eating Eating Activity: 5: Set-up assist for open containers  Pain Pain Assessment Pain Assessment: No/denies pain   Therapy/Group: Individual Therapy  Haileigh Pitz, Selinda Orion 06/02/2015, 4:18 PM

## 2015-06-02 NOTE — Progress Notes (Signed)
Social Work [name], LCSW Social Worker Signed  Patient Care Conference 06/01/2015  2:58 PM    Expand All Collapse All   Inpatient RehabilitationTeam Conference and Plan of Care Update Date: 06/01/2015   Time: 10;55 AM     Patient Name: Jamie [name]       Medical Record Number: [id number]  Date of Birth: [birth year] Sex: Female         Room/Bed: 4W03C/4W03C-01 Payor Info: Payor: MEDICARE / Plan: MEDICARE PART A AND B / Product Type: *No Product type* /    Admitting Diagnosis: sacral fracture with neuroenic bowel and bladder  Admit Date/Time:  05/24/2015  2:59 PM Admission Comments: No comment available   Primary Diagnosis:  Sacral nerve root injury Principal Problem: Sacral nerve root injury    Patient Active Problem List     Diagnosis  Date Noted   .  Sacral nerve root injury  05/24/2015   .  Neurogenic bowel  05/24/2015   .  Neurogenic bladder, flaccid  05/24/2015   .  Hypotonic neurogenic bladder     .  Leg weakness     .  Sacral fracture, closed  05/21/2015   .  Neurosarcoidosis  05/21/2015   .  UTI (lower urinary tract infection)  05/21/2015   .  Urinary retention  05/21/2015     Expected Discharge Date: Expected Discharge Date: 06/10/15  Team Members Present: Physician leading conference: Dr. Alysia Penna Social Worker Present: Ovidio Kin, LCSW Nurse Present: Heather Roberts, RN PT Present: Raylene Everts, PT;Other (comment);Georjean Mode, PT (Lizzie Tygielski-PT) OT Present: Simonne Come, Maryella Shivers, OT SLP Present: Windell Moulding, SLP PPS Coordinator present : Daiva Nakayama, RN, CRRN        Current Status/Progress  Goal  Weekly Team Focus   Medical     cognitive deficits, ?NPH  reduce fall risk  NS eval   Bowel/Bladder     Foley for urineary retention. Pt inont of bowel; neurogenic bowel and bladder  bladder managed with total assist and bowel managed with level 4 assist  attempt voiding trials   Swallow/Nutrition/ Hydration       na         ADL's    min-mod assist bathing, max assist LB dressing, setup UB dressing, min-mod assist transfers  min-supervision  pain management, sitting balance, standing balance, transfers, self-care retraining   Mobility     minA/CGA for transfers and gait, mod/maxA standing balance without UE support, reduced endurance and pain   Supervision all transfers, gait, stairs; minA w/c due to visual impairments  activity tolerance/endurance, LE strength, standing balance    Communication       na         Safety/Cognition/ Behavioral Observations      no unsafe behaviors         Pain     using oxycodone PRN and lidocaine patches   Pain managed with medications at or below level 6  assess need for pain medications. assess effectivness   Skin     abraisons to right knee  no further injury min assist  relef pressure to sacrum frequently. assess skin q shift       *See Care Plan and progress notes for long and short-term goals.    Barriers to Discharge:  family unable to help     Possible Resolutions to Barriers:   see above may need SNF     Discharge Planning/Teaching Needs:   Probably short term NHP due  to will need care at discharge and has no one to provide this. Will meet with pt and parents this afternoon to discuss options       Team Discussion:    Pt is making progress slowly,pain more managed. SP to eval-cognitive issues. Have neuro-check NPH. DC foley today and see if can void. Working on a bowel program. Endurance better. Will need 24 hr care at discharge   Revisions to Treatment Plan:    Probable NHP    Continued Need for Acute Rehabilitation Level of Care: The patient requires daily medical management by a physician with specialized training in physical medicine and rehabilitation for the following conditions: Daily direction of a multidisciplinary physical rehabilitation program to ensure safe treatment while eliciting the highest outcome that is of practical value to the patient.: Yes Daily  medical management of patient stability for increased activity during participation in an intensive rehabilitation regime.: Yes Daily analysis of laboratory values and/or radiology reports with any subsequent need for medication adjustment of medical intervention for : Neurological problems;Other  [name] 06/02/2015, 8:44 AM                  Patient ID: Jamie [name], female   DOB: 08-Nov-1956, 59 y.o.   MRN: [id number]

## 2015-06-02 NOTE — Consult Note (Signed)
NEUROCOGNITIVE [place]   [name] is a 59 year old woman, who was seen for a brief neurocognitive status examination to evaluate her emotional state and mental status in the setting of neurosarcoidosis.  According to her medical record, she was admitted on 05/20/15 after a fall on 05/09/15 that she sustained when moving to [place] from [place]. She also had multiple falls with bladder and bowel incontinence and difficulty walking following her first fall.   CT of her head revealed advanced centralized atrophy with ex vacuo dilation of the ventricular system but was negative of acute changes.    Emotional Functioning:  During the clinical interview, [name] reported that her mood has been "good" and she remarked that she enjoys therapy because it has been helping her to see her progress and work toward increasing her independence.  She proudly shared her most recent accomplishments regarding dressing independently.  She stated that she always "keeps hope" and that listening to radio and television is helpful as she likes to keep busy.  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  She was purportedly prescribed Prozac in the past for management of the aforementioned occasional depression and she said that she feels that the medication is helping, though she does not want to be on it for the rest of her life.  She also denied any major worries.  Of note, she enjoys drumming and played on her tray table a little bit.  This also seemed to help keep her spirits up.  Staff members may find that it improves rapport to ask her about her drumming and encourage her to give a demonstration.  [name] responses to a self-report measure of depressive symptoms were not  suggestive of the presence of clinically significant depression at this time.    Mental Status:  Ms.  Jamie [name] total score on a measure of mental status was not suggestive of clinically significant cognitive disruption, at the level of dementia (MoCA blind = 21/22).  Subjectively, she denied noticing cognitive changes.  The neuropsychologist observed stuttered speech and tangential/circumstantial thought processes.    Impressions and Recommendations:  [name]'s score on an overall measure of mental status was not suggestive of significant cognitive disruption.  Although stuttered speech was observed and her thought processes were frequently tangential, she was oriented and seemed to be able to understand and adequately engage in conversation. She could also remember information if given ample time.  However, given results from head CT, it is possible that she could have cognitive dysfunction in the future and if she notices cognitive disruption, it would be appropriate for her to seek a comprehensive neuropsychological evaluation as an outpatient.  From an emotional standpoint, [name] seems to be coping well with her current situation.  There were no indications of clinically significant mood disruption.  Still, information about risks for development of depression post-discharge were discussed with [name] and she was agreeable to informing her care team should her mood decline significantly.  Although she expressed a desire to discontinue Prozac at some point and despite the fact that her mood is currently upbeat, during her discharge transition would not likely be a good time to taper off antidepressants.  However, in a few months, once she has adjusted, if her mood remains elevated, this could be considered.  DIAGNOSIS:   Neurosarcoidosis  Jamie [name], Psy.D.  Clinical Neuropsychologist

## 2015-06-02 NOTE — Progress Notes (Signed)
Subjective/Complaints: Discussed CT results, reviewed chart , Neurosurgery Dr Kathyrn Sheriff lookedat CT on acute care no surgery for hydrocephalus ROS: + decreased sensation for BM nausea, vomiting, abdominal pain, diarrhea, chest pain, shortness of breath, palpitations, + urinary retention, dizziness,   bleeding, anxiety, or depression   Objective: Vital Signs: Blood pressure 104/57, pulse 80, temperature 98.1 F (36.7 C), temperature source Oral, resp. rate 17, height _0  (1.626 m), weight 60.646 kg (133 lb 11.2 oz), SpO2 99 %. No results found. Results for orders placed or performed during the hospital encounter of 05/24/15 (from the past 72 hour(s))  Creatinine, serum     Status: None   Collection Time: 05/31/15  7:00 AM  Result Value Ref Range   Creatinine, Ser 0.48 0.44 - 1.00 mg/dL   GFR calc non Af Amer >60 >60 mL/min   GFR calc Af Amer >60 >60 mL/min    Comment: (NOTE) The eGFR has been calculated using the CKD EPI equation. This calculation has not been validated in all clinical situations. eGFR's persistently <60 mL/min signify possible Chronic Kidney Disease.      HEENT: normal Cardio: RRR and no murmur Resp: CTA B/L and unlabored GI: BS positive and nontender nondistended Extremity:  No Edema Skin:   Intact Neuro: Alert/Oriented with good insight and awareness. Abnormal Sensory reduced sensation persistent in the lower sacral dermatomes. Weakness with toe flexion and extension,  normal ankle plantar flexion dorsiflexion as well as hip flexion and knee extension.upper extremity strength is 4+ bilaterally Blind.  Musc/Skel:  LB tender Gen. no acute distress, frail appearing   Assessment/Plan: 1. Functional deficits secondary to sacral fracture with sacral nerve root injury causing neurogenic bowel and bladder as well as distal lower extremity numbness and weakness which require 3+ hours per day of interdisciplinary therapy in a comprehensive inpatient rehab  setting. Physiatrist is providing close team supervision and 24 hour management of active medical problems listed below. Physiatrist and rehab team continue to assess barriers to discharge/monitor patient progress toward functional and medical goals. Cognitive deficits per pt started with falls , suspect post concussive +/- pain meds, expect improvement over time but may be months NPH most likely not the issue here Foley out, voiding trial FIM: FIM - Bathing Bathing Steps Patient Completed: Chest, Right Arm, Left Arm, Abdomen, Right upper leg, Left upper leg, Front perineal area Bathing: 3: Mod-Patient completes 5-7 32f10 parts or 50-74%  FIM - Upper Body Dressing/Undressing Upper body dressing/undressing steps patient completed: Thread/unthread right bra strap, Thread/unthread left bra strap, Hook/unhook bra, Thread/unthread right sleeve of pullover shirt/dresss, Thread/unthread left sleeve of pullover shirt/dress, Put head through opening of pull over shirt/dress, Pull shirt over trunk Upper body dressing/undressing: 5: Set-up assist to: Obtain clothing/put away FIM - Lower Body Dressing/Undressing Lower body dressing/undressing steps patient completed: Thread/unthread right pants leg, Thread/unthread left pants leg, Don/Doff right sock, Don/Doff left sock, Don/Doff right shoe, Don/Doff left shoe Lower body dressing/undressing: 4: Min-Patient completed 75 plus % of tasks  FIM - Toileting Toileting steps completed by patient: Adjust clothing prior to toileting, Performs perineal hygiene Toileting Assistive Devices: Grab bar or rail for support Toileting: 3: Mod-Patient completed 2 of 3 steps  FIM - TRadio producerDevices: Grab bars Toilet Transfers: 4-To toilet/BSC: Min A (steadying Pt. > 75%), 4-From toilet/BSC: Min A (steadying Pt. > 75%)  FIM - Bed/Chair Transfer Bed/Chair Transfer Assistive Devices: Arm rests, HOB elevated Bed/Chair Transfer: 5: Supine >  Sit: Supervision (verbal cues/safety  issues), 4: Bed > Chair or W/C: Min A (steadying Pt. > 75%)  FIM - Locomotion: Wheelchair Locomotion: Wheelchair: 1: Total Assistance/staff pushes wheelchair (Pt<25%) FIM - Locomotion: Ambulation Locomotion: Ambulation Assistive Devices: Administrator Ambulation/Gait Assistance: 4: Min guard Locomotion: Ambulation: 5: Travels 150 ft or more with supervision/safety issues  Comprehension Comprehension Mode: Auditory Comprehension: 5-Understands basic 90% of the time/requires cueing < 10% of the time  Expression Expression Mode: Verbal Expression: 5-Expresses basic 90% of the time/requires cueing < 10% of the time.  Social Interaction Social Interaction: 5-Interacts appropriately 90% of the time - Needs monitoring or encouragement for participation or interaction.  Problem Solving Problem Solving: 4-Solves basic 75 - 89% of the time/requires cueing 10 - 24% of the time  Memory Memory: 4-Recognizes or recalls 75 - 89% of the time/requires cueing 10 - 24% of the time  Medical Problem List and Plan: 1. 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  2. DVT Prophylaxis/Anticoagulation: Pharmaceutical: Lovenox 3. Pain Management: resumed Oxycodone. Continue lidoderm  -gabapentin    -may bring in cervical pillow for comfort 4. Mood: Continue prozac.  5. Neuropsych: This patient is capable of making decisions on her own behalf. 6. Skin/Wound Care: Routine pressure relief measure.   air mattress overlay to help with pressure relief as well as pain.  7. Fluids/Electrolytes/Nutrition: intake fair at present   - 8. Urinary retention: consider voiding trial next week. Also Continue flomax.  9. E. Coli UTI: Antibiotics complete. 10. Constipation: continue Miralax bid with suppository daily. 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   12. Neurogenic bladder recommend voiding trial,  start today- d/w pt strong possiblity of I/O cath 13. Neurogenic bowel continue Dulcolax suppositories LOS (Days) 9 A FACE TO FACE EVALUATION WAS PERFORMED  KIRSTEINS,ANDREW E 06/02/2015, 8:54 AM

## 2015-06-02 NOTE — Progress Notes (Signed)
Physical Therapy Session Note  Patient Details  Name: Jamie [name] MRN: [id number] Date of Birth: [birth year]  Today's Date: 06/02/2015 PT Individual Time: 1300-1330 PT Individual Time Calculation (min): 30 min   Short Term Goals: Week 1:  PT Short Term Goal 1 (Week 1): Pt will perform bed mobility (R/L rolling, scooting to [facility]) with supervision and min cues PT Short Term Goal 1 - Progress (Week 1): Not met PT Short Term Goal 2 (Week 1): Pt will perform bed <-> w/c transfer with supervision and min cues for positioning/placement due to visual impairments PT Short Term Goal 2 - Progress (Week 1): Met PT Short Term Goal 3 (Week 1): Pt will perform ambulation with RW x75 ft with min guard and min cues for navigation PT Short Term Goal 3 - Progress (Week 1): Met PT Short Term Goal 4 (Week 1): Pt will perform ascent/descent of 6 3-inch stair with minA and min verbal cues PT Short Term Goal 4 - Progress (Week 1): Met PT Short Term Goal 5 (Week 1): Pt will improve sitting tolerance in tilt-in-space w/c to 30 min in reclined position PT Short Term Goal 5 - Progress (Week 1): Met Week 2:  PT Short Term Goal 1 (Week 2): Pt will perform bed mobility (R/L rolling, scooting to [facility]) with supervision and min cues PT Short Term Goal 2 (Week 2): Pt will perform bed <> w/c transfer with supervision and min cues for navigation due to visual impairments PT Short Term Goal 3 (Week 2): Pt will perform gait with LRAD x250' with supervision and min cues for navigation due to visual impairments PT Short Term Goal 4 (Week 2): Pt will perform ascent/descent of 6 6-inch stairs with minA and min cues PT Short Term Goal 5 (Week 2): Pt will improve sitting tolerance in standard w/c to 2 hrs   Skilled Therapeutic Interventions/Progress Updates:   Session focused on functional gait with RW (100') with focus and cues for improved step length, posture, cadence and endurance at S to steady A level and Nustep on level 5-6 x 5 min  for functional strengthening, endurance, and reciprocal movement pattern re-training. Pt requires cues for redirections throughout session to stay on task. Left up in w/c at end of session with parents in room and all needs in reach.   Therapy Documentation Precautions:  Precautions Precautions: Fall, Back Required Braces or Orthoses: Spinal Brace Spinal Brace: Thoracolumbosacral orthotic Other Brace/Splint: Ordered PRN Restrictions Weight Bearing Restrictions: No  Pain: Denies pain but reports stiffness in BLE. Improved with mobility.  See FIM for current functional status  Therapy/Group: Individual Therapy  Canary Brim Ivory Broad, PT, DPT  06/02/2015, 3:38 PM

## 2015-06-02 NOTE — Plan of Care (Signed)
Problem: SCI BOWEL ELIMINATION Goal: RH STG MANAGE BOWEL WITH ASSISTANCE STG Manage Bowel with mod. Assistance.  Outcome: Not Progressing Bowel program  Problem: SCI BLADDER ELIMINATION Goal: RH STG MANAGE BLADDER WITH MEDICATION WITH ASSISTANCE STG Manage Bladder With Medication With min. Assistance.  Outcome: Not Progressing In and out cath q 8 hours

## 2015-06-02 NOTE — Progress Notes (Signed)
Physical Therapy Session Note  Patient Details  Name: Jamie [name] MRN: [id number] Date of Birth: 22-May-1956  Today's Date: 06/02/2015 PT Individual Time: 1030-1145 PT Individual Time Calculation (min): 75 min   Short Term Goals: Week 2:  PT Short Term Goal 1 (Week 2): Pt will perform bed mobility (R/L rolling, scooting to [facility]) with supervision and min cues PT Short Term Goal 2 (Week 2): Pt will perform bed <> w/c transfer with supervision and min cues for navigation due to visual impairments PT Short Term Goal 3 (Week 2): Pt will perform gait with LRAD x250' with supervision and min cues for navigation due to visual impairments PT Short Term Goal 4 (Week 2): Pt will perform ascent/descent of 6 6-inch stairs with minA and min cues PT Short Term Goal 5 (Week 2): Pt will improve sitting tolerance in standard w/c to 2 hrs   Skilled Therapeutic Interventions/Progress Updates:    Pt received in w/c with reports of sacral pain as described below; agreeable to treatment. Pt instructed in w/c propulsion for 144ft using BUEs and min cues for navigation. Performed with slow speed and UE fatigue at completion of trial. Pt instructed in gait outdoors on level surfaces for 2 trials of approximately 30'. Pt demonstrates decreased gait speed and step length as compared to previous treatment sessions; likely due to new environment outside and pts hyperverbal nature. Requires seated rest breaks due to fatigue. Pt instructed in lower body dressing due to urine leak through brief; performed in sitting/standing with minA due to poor balance and poor forward reaching towards feet to don shoes. Pt performed standing balance activity with dynamic UE tasks while engaged in drumming on tray table; pt requires mod/maxA to maintain standing balance and occasionally leans with UE support on rolling table even when cued not to d/t instability. In sitting position, B ankle flexion strength assessed and measured as 4/5. Pt  instructed in plantar flexion exercise with green theraband, however pt unable to orient theraband independently to allow for performance of exercise without assistance. In standing, assessed plantarflexion strength; pt unable to elevate greater than a few cm, indicative of severe PF strength deficits. Pt educated on role of PT strength to A with balance and prevent anterior LOB. Pt returned to w/c and left seated with all needs within reach.   Therapy Documentation Precautions:  Precautions Precautions: Fall, Back Required Braces or Orthoses: Spinal Brace Spinal Brace: Thoracolumbosacral orthotic Other Brace/Splint: Ordered PRN Restrictions Weight Bearing Restrictions: No Pain: Pain Assessment Pain Assessment: 0-10 Pain Score: 7  Pain Type: Acute pain Pain Location: Sacrum Pain Orientation: Mid Pain Descriptors / Indicators: Aching Patients Stated Pain Goal: 5 Pain Intervention(s): Ambulation/increased activity;Repositioned Multiple Pain Sites: No Locomotion : Ambulation Ambulation/Gait Assistance: 5: Supervision   See FIM for current functional status  Therapy/Group: Individual Therapy  Luberta Mutter 06/02/2015, 2:57 PM

## 2015-06-02 NOTE — Progress Notes (Signed)
Occupational Therapy Session Note  Patient Details  Name: Jamie [name] MRN: [id number] Date of Birth: 06/20/1956  Today's Date: 06/02/2015 OT Individual Time: 4492-0100 OT Individual Time Calculation (min): 75 min    Short Term Goals: Week 2:  OT Short Term Goal 1 (Week 2): Pt will complete shower transfer with RW with min assist OT Short Term Goal 2 (Week 2): Pt will complete bathing with min assist at sit > stand level OT Short Term Goal 3 (Week 2): Pt will complete LB dressing with min assist at sit > stand level OT Short Term Goal 4 (Week 2): Pt will tolerate standing for 5 mins without rest break to increase participation in ADLs  Skilled Therapeutic Interventions/Progress Updates:    Engaged in ADL retraining with focus on sit <> stand, standing tolerance, and increased participation in LB dressing. Pt in bed upon arrival, requesting to eat breakfast prior to bathing and dressing.  Performed stand step transfer bed > standard w/c with pt tolerating sitting in standard w/c for breakfast and bathing tasks. Discussed progress towards goals and plan to increase standing tolerance during functional tasks.  Bathing and dressing completed seated at sink with focus on increased participation with LB dressing and increased standing balance.  Pt stood approx 3 mins x2 while therapist assisted with washing buttocks followed by pt attempting to pull pants over hips, requiring assist due to bulky hospital brief.  Pt able to thread BLE through pants and don socks and shoes this session with cues for technique.  Pt left seated in w/c at sink completing grooming tasks.  Therapy Documentation Precautions:  Precautions Precautions: Fall, Back Required Braces or Orthoses: Spinal Brace Spinal Brace: Thoracolumbosacral orthotic Other Brace/Splint: Ordered PRN Restrictions Weight Bearing Restrictions: No General:   Vital Signs: Therapy Vitals Temp: 98.1 F (36.7 C) Temp Source: Oral Pulse Rate:  80 Resp: 17 BP: (!) 104/57 mmHg Patient Position (if appropriate): Lying Oxygen Therapy SpO2: 99 % O2 Device: Not Delivered Pain:  Pt with c/o pain in sacral area, not rated.  RN provided medication during session.  See FIM for current functional status  Therapy/Group: Individual Therapy  Simonne Come 06/02/2015, 9:02 AM

## 2015-06-03 ENCOUNTER — Inpatient Hospital Stay (HOSPITAL_COMMUNITY): Payer: Medicare Other | Admitting: Occupational Therapy

## 2015-06-03 ENCOUNTER — Ambulatory Visit: Payer: Medicare Other | Admitting: Internal Medicine

## 2015-06-03 ENCOUNTER — Inpatient Hospital Stay (HOSPITAL_COMMUNITY): Payer: Medicare Other

## 2015-06-03 ENCOUNTER — Inpatient Hospital Stay (HOSPITAL_COMMUNITY): Payer: Medicare Other | Admitting: Physical Therapy

## 2015-06-03 MED ORDER — POLYETHYLENE GLYCOL 3350 17 G PO PACK
17.0000 g | PACK | Freq: Every day | ORAL | Status: DC
  Administered 2015-06-04 – 2015-06-05 (×2): 17 g via ORAL
  Filled 2015-06-03 (×3): qty 1

## 2015-06-03 NOTE — Progress Notes (Signed)
Physical Therapy Session Note  Patient Details  Name: Jamie [name] MRN: [id number] Date of Birth: Mar 09, 1956  Today's Date: 06/03/2015 PT Individual Time: 0915-1030 PT Individual Time Calculation (min): 75 min   Short Term Goals: Week 2:  PT Short Term Goal 1 (Week 2): Pt will perform bed mobility (R/L rolling, scooting to [facility]) with supervision and min cues PT Short Term Goal 2 (Week 2): Pt will perform bed <> w/c transfer with supervision and min cues for navigation due to visual impairments PT Short Term Goal 3 (Week 2): Pt will perform gait with LRAD x250' with supervision and min cues for navigation due to visual impairments PT Short Term Goal 4 (Week 2): Pt will perform ascent/descent of 6 6-inch stairs with minA and min cues PT Short Term Goal 5 (Week 2): Pt will improve sitting tolerance in standard w/c to 2 hrs   Skilled Therapeutic Interventions/Progress Updates:    Pt received seated in w/c; reports c/o pain in sacral area as described below. Agreeable to treatment. Pt performed standing balance activity x 10 min while changing brief. Pt able to maintain standing balance with RW and supervision and able to A with donning/doffing pants with one UE at a time. Pt instructed in gait training with RW and sup x250' for two trials indoors on level surfaces. Pt continues to demonstrate shortened step length, gait speed, and forward trunk flexion. Gait deviations improved with tactile and verbal cueing with min/mod carryover when cues are removed. In simulated apartment, pt performed w/c <> bed transfer and bed mobility with supervision overall, min cues needed for navigation during transfer due to visual impairments. Pt instructed in Nustep x10 min with BUEs and BLEs; performed to improve LE strength and ROM as well as improve aerobic endurance for carryover into gait and functional adls. Pt returned to room and performed transfer w/c > recliner with supervision and cues for navigation. Pt remained  in recliner with feet elevated, all needs within reach.   Therapy Documentation Precautions:  Precautions Precautions: Fall, Back Required Braces or Orthoses: Spinal Brace Spinal Brace: Thoracolumbosacral orthotic Other Brace/Splint: Ordered PRN Restrictions Weight Bearing Restrictions: No Pain: Pain Assessment Pain Assessment: No/denies pain Pain Score: 6  Faces Pain Scale: Hurts little more Pain Location: Sacrum Pain Orientation: Mid Pain Descriptors / Indicators: Aching Pain Onset: On-going Patients Stated Pain Goal: 5 Pain Intervention(s): Ambulation/increased activity;Repositioned Multiple Pain Sites: No Locomotion : Ambulation Ambulation/Gait Assistance: 5: Supervision   See FIM for current functional status  Therapy/Group: Individual Therapy  Luberta Mutter 06/03/2015, 3:20 PM

## 2015-06-03 NOTE — Plan of Care (Signed)
Problem: SCI BLADDER ELIMINATION Goal: RH STG MANAGE BLADDER WITH MEDICATION WITH ASSISTANCE STG Manage Bladder With Medication With min. Assistance.  Outcome: Not Progressing Incontinent at this time

## 2015-06-03 NOTE — Plan of Care (Signed)
Problem: RH PAIN MANAGEMENT Goal: RH STG PAIN MANAGED AT OR BELOW PT'S PAIN GOAL Patient will maintain a pain level at or below 3.  Outcome: Progressing No report of pain

## 2015-06-03 NOTE — Plan of Care (Signed)
Problem: SCI BOWEL ELIMINATION Goal: RH STG MANAGE BOWEL WITH ASSISTANCE STG Manage Bowel with mod. Assistance.  Outcome: Not Progressing Total A

## 2015-06-03 NOTE — Plan of Care (Signed)
Problem: SCI BOWEL ELIMINATION Goal: RH STG SCI MANAGE BOWEL WITH MEDICATION WITH ASSISTANCE STG SCI Manage bowel with medication with mod. assistance.  Outcome: Not Progressing Total A

## 2015-06-03 NOTE — Progress Notes (Signed)
Subjective/Complaints: Discussed bowel and bladder program with nursing. Also patient was there. Loose stools noted. Incontinent of bowel and bladder, post void residual last measured at 0 ROS: + decreased sensation for BM nausea, vomiting, abdominal pain, diarrhea, chest pain, shortness of breath, palpitations, + urinary retention, dizziness,   bleeding, anxiety, or depression   Objective: Vital Signs: Blood pressure 114/53, pulse 84, temperature 97.9 F (36.6 C), temperature source Oral, resp. rate 17, height 5\' 4"  (1.626 m), weight 60.646 kg (133 lb 11.2 oz), SpO2 98 %. No results found. No results found for this or any previous visit (from the past 72 hour(s)).   HEENT: normal Cardio: RRR and no murmur Resp: CTA B/L and unlabored GI: BS positive and nontender nondistended Extremity:  No Edema Skin:   Intact Neuro: Alert/Oriented with good insight and awareness. Abnormal Sensory reduced sensation persistent in the lower sacral dermatomes. Weakness with toe flexion and extension,  normal ankle plantar flexion dorsiflexion as well as hip flexion and knee extension.upper extremity strength is 4+ bilaterally Blind.  Musc/Skel:  LB tender Gen. no acute distress, frail appearing   Assessment/Plan: 1. 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 Physiatrist is providing close team supervision and 24 hour management of active medical problems listed below. Physiatrist and rehab team continue to assess barriers to discharge/monitor patient progress toward functional and medical goals.  FIM: FIM - Bathing Bathing Steps Patient Completed: Chest, Right Arm, Left Arm, Abdomen, Front perineal area Bathing: 3: Mod-Patient completes 5-7 36f 10 parts or 50-74%  FIM -  Upper Body Dressing/Undressing Upper body dressing/undressing steps patient completed: Thread/unthread right bra strap, Thread/unthread left bra strap, Hook/unhook bra, Thread/unthread right sleeve of pullover shirt/dresss, Thread/unthread left sleeve of pullover shirt/dress, Put head through opening of pull over shirt/dress, Pull shirt over trunk Upper body dressing/undressing: 5: Set-up assist to: Obtain clothing/put away FIM - Lower Body Dressing/Undressing Lower body dressing/undressing steps patient completed: Thread/unthread right pants leg, Thread/unthread left pants leg, Don/Doff right sock, Don/Doff left sock, Don/Doff right shoe, Don/Doff left shoe, Pull pants up/down Lower body dressing/undressing: 4: Steadying Assist  FIM - Toileting Toileting steps completed by patient: Adjust clothing prior to toileting, Performs perineal hygiene Toileting Assistive Devices: Grab bar or rail for support Toileting: 3: Mod-Patient completed 2 of 3 steps  FIM - Radio producer Devices: Grab bars Toilet Transfers: 4-To toilet/BSC: Min A (steadying Pt. > 75%), 4-From toilet/BSC: Min A (steadying Pt. > 75%)  FIM - Bed/Chair Transfer Bed/Chair Transfer Assistive Devices: Arm rests, HOB elevated Bed/Chair Transfer: 5: Supine > Sit: Supervision (verbal cues/safety issues), 4: Bed > Chair or W/C: Min A (steadying Pt. > 75%)  FIM - Locomotion: Wheelchair Locomotion: Wheelchair: 2: Travels 50 - 149 ft with minimal assistance (Pt.>75%) FIM - Locomotion: Ambulation Locomotion: Ambulation Assistive Devices: Administrator Ambulation/Gait Assistance: 5: Supervision Locomotion: Ambulation: 5: Travels 150 ft or more with supervision/safety issues  Comprehension Comprehension Mode: Auditory Comprehension: 5-Understands basic 90% of the time/requires cueing < 10% of the time  Expression Expression Mode: Verbal Expression: 5-Expresses complex 90% of the time/cues < 10% of the  time  Social Interaction Social Interaction: 5-Interacts appropriately 90% of the time - Needs monitoring or encouragement for participation or interaction.  Problem Solving Problem Solving: 4-Solves basic 75 - 89% of  the time/requires cueing 10 - 24% of the time  Memory Memory: 4-Recognizes or recalls 75 - 89% of the time/requires cueing 10 - 24% of the time  Medical Problem List and Plan: 1. 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  2. DVT Prophylaxis/Anticoagulation: Pharmaceutical: Lovenox 3. Pain Management: resumed Oxycodone. Continue lidoderm  -gabapentin    -may bring in cervical pillow for comfort 4. Mood: Continue prozac.  5. Neuropsych: This patient is capable of making decisions on her own behalf. 6. Skin/Wound Care: Routine pressure relief measure.   air mattress overlay to help with pressure relief as well as pain.  7. Fluids/Electrolytes/Nutrition: intake fair at present   - 8. Urinary retention: consider voiding trial next week. Also Continue flomax.  9. E. Coli UTI: Antibiotics complete. 10. Constipation: continue Miralax bid with suppository daily. 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   12. Neurogenic bladder recommend voiding trial, patient has no signs of urinary retention but is incontinent of bladder 13. Neurogenic bowel continue Dulcolax suppositories, incontinent of bowel loose BMs will hold MiraLAX LOS (Days) 10 A FACE TO FACE EVALUATION WAS PERFORMED  Jamie [name] E 06/03/2015, 9:53 AM

## 2015-06-03 NOTE — Progress Notes (Addendum)
Speech Language Pathology Daily Session Note  Patient Details  Name: Jamie [name] MRN: [id number] Date of Birth: 1956-05-04  Today's Date: 06/03/2015 SLP Individual Time: 1105-1130 SLP Individual Time Calculation (min): 25 min  Short Term Goals: Week 1: SLP Short Term Goal 1 (Week 1): Patient will demonstrate selective attention in a mildly distracting enviornment for 30 minutes with supervision verbal cues for redirection.  SLP Short Term Goal 2 (Week 1): Patient will self-monitor and correct verbosity during a functional conversation with supervision question cues.  SLP Short Term Goal 3 (Week 1): Patient will maintain a topic of conversation for ~3 turns with supervision verbal and question cues.  SLP Short Term Goal 4 (Week 1): Patient will perform basic problem solving for functional and familiar tasks with supervision verbal and question cues.   Skilled Therapeutic Interventions: Treatment focused on attention (sustained and selective) and ability to sustain focus given divergent category activity. She required min-moderate reminders to refrain from commenting/expanations until time limit reached. She improved throughout session from 9 items/category to 19 following verbal reminders and goal of therapy. During conversation pt remained on topic of conversation for 4-5  exchanges with therapist.      FIM:  Comprehension Comprehension Mode: Auditory Comprehension: 6-Follows complex conversation/direction: With extra time/assistive device Expression Expression Mode: Verbal Expression: 6-Expresses complex ideas: With extra time/assistive device Social Interaction Social Interaction: 6-Interacts appropriately with others with medication or extra time (anti-anxiety, antidepressant). Problem Solving Problem Solving: 5-Solves basic 90% of the time/requires cueing < 10% of the time Memory Memory: 4-Recognizes or recalls 75 - 89% of the time/requires cueing 10 - 24% of the time FIM -  Eating Eating Activity: 5: Set-up assist for open containers  Pain Pain Assessment Pain Score: 4  Faces Pain Scale: Hurts little more Pain Location: Sacrum Pain Intervention(s): RN made aware  Therapy/Group: Individual Therapy  [name], [name] 06/03/2015, 12:06 PM

## 2015-06-03 NOTE — Progress Notes (Signed)
Occupational Therapy Session Note  Patient Details  Name: Jamie [name] MRN: [id number] Date of Birth: 1956/07/25  Today's Date: 06/03/2015 OT Individual Time: 9179-1505 and 1400-1430 OT Individual Time Calculation (min): 60 min and 30 min   Short Term Goals: Week 2:  OT Short Term Goal 1 (Week 2): Pt will complete shower transfer with RW with min assist OT Short Term Goal 2 (Week 2): Pt will complete bathing with min assist at sit > stand level OT Short Term Goal 3 (Week 2): Pt will complete LB dressing with min assist at sit > stand level OT Short Term Goal 4 (Week 2): Pt will tolerate standing for 5 mins without rest break to increase participation in ADLs  Skilled Therapeutic Interventions/Progress Updates:   1) Engaged in ADL retraining with focus on functional transfers, sit <> stand, and standing tolerance during self-care tasks.  Pt in bed upon arrival declining bathing at shower level but wanting to wash and dress at sink.  Reports needing to toilet, performed stand pivot transfer w/c > padded toilet seat over toilet with min assist and use of grab bars for steadying assist.  Pt incontinent of BM and urine prior to toileting task requiring assist for hygiene post BM.  Bathing and dressing compeleted at overall min/steady assist at sit > stand level at sink.  Pt tolerated standing approx 5 mins during perineal hygiene and for therapist to don hospital incontinence brief.  Pt able to complete LB dressing with increased time and steady assist this session requiring cues for setup.  Pt demonstrating carryover of locating grooming items with technique to make sure she returns items to previous place to ensure they will be in same place next time.  Pt with increase tangential conversation this session requiring min-mod cues to redirect to task at hand.  Pt left seated upright in standard w/c with breakfast tray and all items located.  2) Engaged in therapeutic activity with focus on BUE  strengthening and sitting tolerance.  Completed UE arm bike in sitting with 5 mins forward and 5 mins backward on resistance level 3.0 with no rest break.  Zoom ball activity in unsupported sitting without leg support with focus on BUE use and sitting balance and tolerance.  Pt able to participate in entire session without rest break.  Pt motivated by progress and reports she enjoys therapy sessions.  Therapy Documentation Precautions:  Precautions Precautions: Fall, Back Required Braces or Orthoses: Spinal Brace Spinal Brace: Thoracolumbosacral orthotic Other Brace/Splint: Ordered PRN Restrictions Weight Bearing Restrictions: No Pain:   Pt with reports of pain in sacrum, not rated  See FIM for current functional status  Therapy/Group: Individual Therapy  Simonne Come 06/03/2015, 9:42 AM

## 2015-06-04 ENCOUNTER — Inpatient Hospital Stay (HOSPITAL_COMMUNITY): Payer: Medicare Other | Admitting: Occupational Therapy

## 2015-06-04 NOTE — Progress Notes (Signed)
Patient ID: Jamie [name], female   DOB: 25-Mar-1956, 59 y.o.   MRN: [id number]   06/04/15.  Subjective/Complaints:  58 y/o admit for CIR with  functional deficits secondary to Sacral fracture with sacral nerve root injury causing neurogenic bowel and bladder.    Incontinent of bowel and bladder, post void residual last measured at 0 ROS: + decreased sensation for BM nausea, vomiting, abdominal pain, diarrhea, chest pain, shortness of breath, palpitations, + urinary retention, dizziness,   bleeding, anxiety, or depression  Past Medical History  Diagnosis Date  . 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 Neurosarcoidosis in adult     followed at [facility]  . 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 Progressive gait disorder     Was treated for probable MS until diagnosed with Neurosarcoidosis about 10 years ago.      Intake/Output Summary (Last 24 hours) at 06/04/15 0832 Last data filed at 06/03/15 1900  Gross per 24 hour  Intake    720 ml  Output      0 ml  Net    720 ml    Patient Vitals for the past 24 hrs:  BP Temp Temp src Pulse Resp SpO2  06/04/15 0554 (!) 110/53 mmHg 98.9 F (37.2 C) Oral (!) 102 18 100 %  06/03/15 1348 (!) 100/54 mmHg 98.4 F (36.9 C) Oral 90 18 100 %     Objective: Vital Signs: Blood pressure 110/53, pulse 102, temperature 98.9 F (37.2 C), temperature source Oral, resp. rate 18, height 5\' 4"  (1.626 m), weight 133 lb 11.2 oz (60.646 kg), SpO2 100 %. No results found. No results found for this or any previous visit (from the past 72 hour(s)).   HEENT: normal  Blind  Cardio: RRR and no murmur Resp: CTA B/L and unlabored GI: BS positive and nontender nondistended Extremity:  No Edema Skin:   Intact Neuro: Alert/Oriented with good insight and awareness. Abnormal Sensory reduced sensation persistent in the lower sacral dermatomes. Weakness with toe flexion and extension Musc/Skel:  LB tender Gen. no acute distress, frail  appearing    Medical Problem List and Plan: 1. 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  2. DVT Prophylaxis/Anticoagulation: Pharmaceutical: Lovenox 3. Pain Management: resumed Oxycodone. Continue lidoderm  -gabapentin   4. Urinary retention: Continue flomax.   5. Constipation: continue Miralax bid with suppository daily. 6. Neurosarcoidosis: Legally blind.  7. Neurogenic bladder recommend voiding trial, patient has no signs of urinary retention but is incontinent of bladder 8. Neurogenic bowel continue Dulcolax suppositories, incontinent of bowel loose BMs will hold MiraLAX LOS (Days) 11 A FACE TO FACE EVALUATION WAS PERFORMED  Nyoka Cowden 06/04/2015, 8:29 AM

## 2015-06-04 NOTE — Progress Notes (Signed)
Occupational Therapy Session Note  Patient Details  Name: Jamie [name] MRN: [id number] Date of Birth: [birth year]  Today's Date: 06/04/2015 OT Individual Time: 1515-1600 OT Individual Time Calculation (min): 45 min    Short Term Goals: Week 1:  OT Short Term Goal 1 (Week 1): Pt will complete bathing at mod assist level from EOB OT Short Term Goal 1 - Progress (Week 1): Met OT Short Term Goal 2 (Week 1): Pt will complete LB dressing at sit > stand level with max assist OT Short Term Goal 2 - Progress (Week 1): Met OT Short Term Goal 3 (Week 1): Pt will complete toilet transfer with RW with min assist OT Short Term Goal 3 - Progress (Week 1): Met OT Short Term Goal 4 (Week 1): Pt will complete shower transfer with RW with mod assist OT Short Term Goal 4 - Progress (Week 1): Not met Week 2:  OT Short Term Goal 1 (Week 2): Pt will complete shower transfer with RW with min assist OT Short Term Goal 2 (Week 2): Pt will complete bathing with min assist at sit > stand level OT Short Term Goal 3 (Week 2): Pt will complete LB dressing with min assist at sit > stand level OT Short Term Goal 4 (Week 2): Pt will tolerate standing for 5 mins without rest break to increase participation in ADLs  Skilled Therapeutic Interventions/Progress Updates:    Pt. Sitting in wc upon OT arrival.  Pt. Donned shoes and sock with increased time and set up assist.  She transferred from wc to toilet with min assist.  She needed max assist with clothes manipulation and peri care during standing.   Went outside in wc at end of session.  Left pt in wc in room with call bell,phone within reach.     Therapy Documentation Precautions:  Precautions Precautions: Fall, Back Required Braces or Orthoses: Spinal Brace Spinal Brace: Thoracolumbosacral orthotic Other Brace/Splint: Ordered PRN Restrictions Weight Bearing Restrictions: No      Pain:  7/10 hamstrings         See FIM for current functional  status  Therapy/Group: Individual Therapy  Shevy Roca 06/04/2015, 8:00 PM

## 2015-06-04 NOTE — Plan of Care (Signed)
Problem: SCI BOWEL ELIMINATION Goal: RH STG MANAGE BOWEL WITH ASSISTANCE STG Manage Bowel with mod. Assistance.  Outcome: Not Progressing Total assist, pt on bowel program with suppository and dig stim   Problem: SCI BLADDER ELIMINATION Goal: RH STG MANAGE BLADDER WITH MEDICATION WITH ASSISTANCE STG Manage Bladder With Medication With min. Assistance.  Outcome: Not Progressing incont episodes

## 2015-06-04 NOTE — Plan of Care (Signed)
Problem: SCI BOWEL ELIMINATION Goal: RH STG MANAGE BOWEL WITH ASSISTANCE STG Manage Bowel with mod. Assistance.  Outcome: Not Progressing Bowel program  Problem: SCI BLADDER ELIMINATION Goal: RH STG MANAGE BLADDER WITH MEDICATION WITH ASSISTANCE STG Manage Bladder With Medication With min. Assistance.  Outcome: Not Progressing Toilet q 4 hours

## 2015-06-05 ENCOUNTER — Inpatient Hospital Stay (HOSPITAL_COMMUNITY): Payer: Medicare Other | Admitting: *Deleted

## 2015-06-05 NOTE — Progress Notes (Signed)
Patient ID: Jamie [name], female   DOB: 03-Dec-1956, 59 y.o.   MRN: [id number]  Patient ID: Jamie [name], female   DOB: 1956-11-12, 59 y.o.   MRN: [id number]   06/05/15.  Subjective/Complaints:  59 y/o admit for CIR with  functional deficits secondary to Sacral fracture with sacral nerve root injury causing neurogenic bowel and bladder.  Only complaint is sacral pain-states lidoderm patch very helpful.   Incontinent of bowel and bladder, post void residual last measured at 0 ROS: + decreased sensation for BM nausea, vomiting, abdominal pain, diarrhea, chest pain, shortness of breath, palpitations, + urinary retention, dizziness,   bleeding, anxiety, or depression  Past Medical History  Diagnosis Date  . 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 Neurosarcoidosis in adult     followed at [facility]  . 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 Progressive gait disorder     Was treated for probable MS until diagnosed with Neurosarcoidosis about 10 years ago.      Intake/Output Summary (Last 24 hours) at 06/05/15 0818 Last data filed at 06/04/15 1800  Gross per 24 hour  Intake    480 ml  Output      0 ml  Net    480 ml    Patient Vitals for the past 24 hrs:  BP Temp Temp src Pulse Resp SpO2  06/05/15 0500 (!) 103/44 mmHg 97.9 F (36.6 C) Oral 98 18 100 %  06/04/15 1519 (!) 91/49 mmHg 98.3 F (36.8 C) Oral 97 18 100 %     Objective: Vital Signs: Blood pressure 103/44, pulse 98, temperature 97.9 F (36.6 C), temperature source Oral, resp. rate 18, height 5\' 4"  (1.626 m), weight 133 lb 11.2 oz (60.646 kg), SpO2 100 %. No results found. No results found for this or any previous visit (from the past 72 hour(s)).   HEENT: normal  Blind  Cardio: RRR and no murmur Resp: CTA B/L and unlabored GI: BS positive and nontender nondistended Extremity:  No Edema Skin:   Intact Neuro: Alert/Oriented with good insight and awareness. Abnormal Sensory reduced sensation persistent in the  lower sacral dermatomes. Weakness with toe flexion and extension Musc/Skel:  LB tender Gen. no acute distress, frail appearing    Medical Problem List and Plan: 1. 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  2. DVT Prophylaxis/Anticoagulation: Pharmaceutical: Lovenox 3. Pain Management: resumed Oxycodone. Continue lidoderm  -gabapentin 4. Urinary retention: Continue flomax.  5. Constipation: continue Miralax bid with suppository daily. 6. Neurosarcoidosis: Legally blind.  7. Neurogenic bladder recommend voiding trial, patient has no signs of urinary retention but is incontinent of bladder 8. Neurogenic bowel continue Dulcolax suppositories, incontinent of bowel loose BMs will hold MiraLAX LOS (Days) 12 A FACE TO FACE EVALUATION WAS PERFORMED  Nyoka Cowden 06/05/2015, 8:18 AM

## 2015-06-05 NOTE — Progress Notes (Signed)
Physical Therapy Session Note  Patient Details  Name: Jamie [name] MRN: [id number] Date of Birth: [birth year]  Today's Date: 06/05/2015 PT Individual Time: 7588-3254 PT Individual Time Calculation (min): 45 min    Skilled Therapeutic Interventions/Progress Updates:  Patient in bed at the beginning of the session, agrees to therapy interventions, no complains of pain. Patient performed bed mobility in order to donn pants with min to mod A to pull up,supine to sit with min A. In sitting EOb patient put on a bra and shirt with min A,applied TSLO.Patient requires increased time to perform all functions. Sit to stand from bed with min A, as soon as patient achieved standing position , and episode of incontinence appeared. Patient transferred to w/c with 3 steps and rolled into the bathroom. Transfer to commode with use of grab rails with min A and guidance to turn. Patient required max A fro all hygiene but assisted in donning on pants. Transferred back to w/c and performed hand washing at w/c level.  Patient left sitting in w/c with all needs within reach.  Therapy Documentation Precautions:  Precautions Precautions: Fall, Back Required Braces or Orthoses: Spinal Brace Spinal Brace: Thoracolumbosacral orthotic Other Brace/Splint: Ordered PRN Restrictions Weight Bearing Restrictions: No  See FIM for current functional status  Therapy/Group: Individual Therapy  [name] 06/05/2015, 12:29 PM

## 2015-06-06 ENCOUNTER — Inpatient Hospital Stay (HOSPITAL_COMMUNITY): Payer: Medicare Other | Admitting: Occupational Therapy

## 2015-06-06 ENCOUNTER — Inpatient Hospital Stay (HOSPITAL_COMMUNITY): Payer: Medicare Other | Admitting: Physical Therapy

## 2015-06-06 ENCOUNTER — Inpatient Hospital Stay (HOSPITAL_COMMUNITY): Payer: Medicare Other | Admitting: Speech Pathology

## 2015-06-06 NOTE — Progress Notes (Signed)
Recreational Therapy Session Note  Patient Details  Name: Jamie [name] MRN: [id number] Date of Birth: 1956-07-19 Today's Date: 06/06/2015  Pain: no c/o Skilled Therapeutic Interventions/Progress Updates: Session focused on psychosocial support as pt was requesting to go outside.  Took pt outside via w/c with Total assist and discussed therapeutic benefits of being out in nature.  Pt described feelings of self satisfaction, calmness, relaxation, & overall wellness when feeling the warmth of the sun.  Pt expressed appreciation for this opportunity.  Therapy/Group: Individual Therapy   Edelmira Gallogly,[place] 06/06/2015, 4:17 PM

## 2015-06-06 NOTE — Progress Notes (Signed)
Speech Language Pathology Daily Session Note  Patient Details  Name: Jamie [name] MRN: [id number] Date of Birth: 1956-04-19  Today's Date: 06/06/2015 SLP Individual Time: 7673-4193 SLP Individual Time Calculation (min): 60 min  Short Term Goals: Week 1: SLP Short Term Goal 1 (Week 1): Patient will demonstrate selective attention in a mildly distracting enviornment for 30 minutes with supervision verbal cues for redirection.  SLP Short Term Goal 2 (Week 1): Patient will self-monitor and correct verbosity during a functional conversation with supervision question cues.  SLP Short Term Goal 3 (Week 1): Patient will maintain a topic of conversation for ~3 turns with supervision verbal and question cues.  SLP Short Term Goal 4 (Week 1): Patient will perform basic problem solving for functional and familiar tasks with supervision verbal and question cues.   Skilled Therapeutic Interventions: Skilled treatment session focused on addressing cognition goals. SLP facilitated session by providing Supervision verbal cues for redirection back to categorical generative naming task with intermittent conversations that pertained to the topic.  Patient was able to effectively direct care with donning of back brace with Supervision question cues. Continue with current plan of care.   FIM:  Comprehension Comprehension Mode: Auditory Comprehension: 5-Understands complex 90% of the time/Cues < 10% of the time Expression Expression Mode: Verbal Expression: 5-Expresses complex 90% of the time/cues < 10% of the time Social Interaction Social Interaction: 5-Interacts appropriately 90% of the time - Needs monitoring or encouragement for participation or interaction. Problem Solving Problem Solving: 4-Solves basic 75 - 89% of the time/requires cueing 10 - 24% of the time Memory Memory: 5-Recognizes or recalls 90% of the time/requires cueing < 10% of the time FIM - Eating Eating Activity: 5: Set-up assist for open  containers  Pain Pain Assessment Pain Assessment: No/denies pain  Therapy/Group: Individual Therapy  Carmelia Roller., [place] [phone]  [place] 06/06/2015, 12:32 PM

## 2015-06-06 NOTE — Progress Notes (Signed)
Subjective/Complaints: No retention however she is incontinent of urine with stress incontinence as well. ROS: + decreased sensation for BM nausea, vomiting, abdominal pain, diarrhea, chest pain, shortness of breath, palpitations, + urinary retention, dizziness,   bleeding, anxiety, or depression   Objective: Vital Signs: Blood pressure 106/55, pulse 94, temperature 98.1 F (36.7 C), temperature source Oral, resp. rate 17, height 5\' 4"  (1.626 m), weight 60.646 kg (133 lb 11.2 oz), SpO2 100 %. No results found. No results found for this or any previous visit (from the past 72 hour(s)).   HEENT: normal Cardio: RRR and no murmur Resp: CTA B/L and unlabored GI: BS positive and nontender nondistended Extremity:  No Edema Skin:   Intact Neuro: Alert/Oriented with good insight and awareness. Abnormal Sensory the patient now has sensation to light touch on the plantar surface of both feet Weakness with toe flexion and extension,  normal ankle plantar flexion dorsiflexion as well as hip flexion and knee extension.upper extremity strength is 4+ bilaterally Blind.  Musc/Skel:  LB tender Gen. no acute distress, frail appearing   Assessment/Plan: 1. 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 Physiatrist is providing close team supervision and 24 hour management of active medical problems listed below. Physiatrist and rehab team continue to assess barriers to discharge/monitor patient progress toward functional and medical goals.  FIM: FIM - Bathing Bathing Steps Patient Completed: Chest, Right Arm, Left Arm, Abdomen, Front perineal area Bathing: 3: Mod-Patient completes 5-7 17f 10 parts or 50-74%  FIM - Upper Body Dressing/Undressing Upper body dressing/undressing  steps patient completed: Thread/unthread right bra strap, Thread/unthread left bra strap, Hook/unhook bra, Thread/unthread right sleeve of pullover shirt/dresss, Thread/unthread left sleeve of pullover shirt/dress, Put head through opening of pull over shirt/dress, Pull shirt over trunk Upper body dressing/undressing: 5: Set-up assist to: Obtain clothing/put away FIM - Lower Body Dressing/Undressing Lower body dressing/undressing steps patient completed: Thread/unthread right pants leg, Thread/unthread left pants leg, Don/Doff right sock, Don/Doff left sock, Don/Doff right shoe, Don/Doff left shoe, Pull pants up/down Lower body dressing/undressing: 4: Steadying Assist  FIM - Toileting Toileting steps completed by patient: Adjust clothing prior to toileting, Performs perineal hygiene Toileting Assistive Devices: Grab bar or rail for support Toileting: 3: Mod-Patient completed 2 of 3 steps  FIM - Radio producer Devices: Grab bars Toilet Transfers: 4-To toilet/BSC: Min A (steadying Pt. > 75%), 4-From toilet/BSC: Min A (steadying Pt. > 75%)  FIM - Bed/Chair Transfer Bed/Chair Transfer Assistive Devices: Copy: 5: Supine > Sit: Supervision (verbal cues/safety issues), 5: Sit > Supine: Supervision (verbal cues/safety issues), 5: Chair or W/C > Bed: Supervision (verbal cues/safety issues), 5: Bed > Chair or W/C: Supervision (verbal cues/safety issues)  FIM - Locomotion: Wheelchair Locomotion: Wheelchair: 2: Travels 50 - 149 ft with minimal assistance (Pt.>75%) FIM - Locomotion: Ambulation Locomotion: Ambulation Assistive Devices: Administrator Ambulation/Gait Assistance: 5: Supervision Locomotion: Ambulation: 5: Travels 150 ft or more with supervision/safety issues  Comprehension Comprehension Mode: Auditory Comprehension: 6-Follows complex conversation/direction: With extra time/assistive device  Expression Expression Mode:  Verbal Expression: 6-Expresses complex ideas: With extra time/assistive device  Social Interaction Social Interaction: 6-Interacts appropriately with others with medication or extra time (anti-anxiety, antidepressant).  Problem Solving Problem Solving: 5-Solves basic 90% of the time/requires cueing < 10% of the time  Memory Memory: 4-Recognizes or recalls 75 - 89% of the time/requires cueing 10 - 24% of the time  Medical Problem List and Plan: 1. 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  2. DVT Prophylaxis/Anticoagulation: Pharmaceutical: Lovenox 3. Pain Management: resumed Oxycodone. Continue lidoderm  -gabapentin    -may bring in cervical pillow for comfort 4. Mood: Continue prozac.  5. Neuropsych: This patient is capable of making decisions on her own behalf. 6. Skin/Wound Care: Routine pressure relief measure.   air mattress overlay to help with pressure relief as well as pain.  7. Fluids/Electrolytes/Nutrition: intake fair at present   - 8. Urinary retention: consider voiding trial next week. Also Continue flomax.  9. E. Coli UTI: Antibiotics complete. 10. Constipation: continue Miralax bid with suppository daily. 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   12. Neurogenic bladder recommend voiding trial, patient has no signs of urinary retention but is incontinent of bladder 13. Neurogenic bowel continue Dulcolax suppositories, LOS (Days) 13 A FACE TO FACE EVALUATION WAS PERFORMED  Jamie [name] E 06/06/2015, 9:03 AM

## 2015-06-06 NOTE — Progress Notes (Signed)
Physical Therapy Session Note  Patient Details  Name: Jamie [name] MRN: [id number] Date of Birth: 1956/07/24  Today's Date: 06/06/2015 PT Individual Time: 1300-1415 PT Individual Time Calculation (min): 75 min   Short Term Goals: Week 2:  PT Short Term Goal 1 (Week 2): Pt will perform bed mobility (R/L rolling, scooting to [facility]) with supervision and min cues PT Short Term Goal 2 (Week 2): Pt will perform bed <> w/c transfer with supervision and min cues for navigation due to visual impairments PT Short Term Goal 3 (Week 2): Pt will perform gait with LRAD x250' with supervision and min cues for navigation due to visual impairments PT Short Term Goal 4 (Week 2): Pt will perform ascent/descent of 6 6-inch stairs with minA and min cues PT Short Term Goal 5 (Week 2): Pt will improve sitting tolerance in standard w/c to 2 hrs   Skilled Therapeutic Interventions/Progress Updates:    Pt received seated in w/c with c/o pain as described below; agreeable to treatment. Pt requests to use restroom; pt assisted in ambulation into bathroom and transfer onto elevated toilet seat with RW and supervision, cues for navigation due to visual impairments. Pt performed donning/doffing pants with supervision and required assistance for perineal hygiene. Requires maxA for donning/doffing brief. In therapy gym, pt educated on use of LiteGait system and goal of improving step length, balance, and confidence with increased gait speed; pt agreeable to trial. Initial trial performed 5:36 min at 0.7-0.9 mph for total 331ft before pt reports needing to sit due to fatigue. Required approximately 7 min rest break in semi-reclined position due to sacral discomfort. Second trial performed 5:45 and 365ft at 0.59mph. Pt returned to room due to report of needing to use restroom; assisted pt with changing brief with maxA, however able to don/doff pants with supervision. Pt remained seated in tilt-in-space w/c at completion of session with all  needs within reach.   Therapy Documentation Precautions:  Precautions Precautions: Fall, Back Required Braces or Orthoses: Spinal Brace Spinal Brace: Thoracolumbosacral orthotic Other Brace/Splint: Ordered PRN Restrictions Weight Bearing Restrictions: No Pain: Pain Assessment Pain Assessment: 0-10 Pain Score: 6  Pain Type: Acute pain Pain Location: Sacrum Pain Orientation: Mid Pain Descriptors / Indicators: Aching Pain Onset: On-going Patients Stated Pain Goal: 3 Pain Intervention(s): Ambulation/increased activity;Repositioned Multiple Pain Sites: No Locomotion : Ambulation Ambulation/Gait Assistance: 5: Supervision   See FIM for current functional status  Therapy/Group: Individual Therapy  Luberta Mutter 06/06/2015, 2:31 PM

## 2015-06-06 NOTE — Progress Notes (Signed)
Occupational Therapy Session Note  Patient Details  Name: Jamie [name] MRN: [id number] Date of Birth: July 01, 1956  Today's Date: 06/06/2015 OT Individual Time: 0800-0900 OT Individual Time Calculation (min): 60 min    Short Term Goals: Week 2:  OT Short Term Goal 1 (Week 2): Pt will complete shower transfer with RW with min assist OT Short Term Goal 2 (Week 2): Pt will complete bathing with min assist at sit > stand level OT Short Term Goal 3 (Week 2): Pt will complete LB dressing with min assist at sit > stand level OT Short Term Goal 4 (Week 2): Pt will tolerate standing for 5 mins without rest break to increase participation in ADLs  Skilled Therapeutic Interventions/Progress Updates:    Engaged in ADL retraining with focus on functional transfers, standing tolerance, and sustained attention to tasks.  Pt performed stand pivot transfers bed > w/c with min assist due to decreased balance and visual impairments, cues for safety.  Stand pivot w/c > padded toilet seat with use of grab bars and steady assist.  Pt incontinent of bowel and bladder prior to toileting and then leaked urine during ambulation to tub bench in room shower with no awareness.  Bathing completed at sit > stand level in room shower with cues for location of items and safety in shower.  Dressing completed with setup assist and steady assist when standing to pull pants over hips.  Re-educated on technique for donning shoes to increase independence.  Pt left seated at sink to complete grooming tasks.  Therapy Documentation Precautions:  Precautions Precautions: Fall, Back Required Braces or Orthoses: Spinal Brace Spinal Brace: Thoracolumbosacral orthotic Other Brace/Splint: Ordered PRN Restrictions Weight Bearing Restrictions: No Pain: Pain Assessment Pain Assessment: 0-10 Pain Score: 7  Pain Type: Acute pain Pain Location: Sacrum Pain Orientation: Mid Pain Descriptors / Indicators: Aching Pain Onset:  On-going Patients Stated Pain Goal: 3 Pain Intervention(s): Ambulation/increased activity Multiple Pain Sites: No  See FIM for current functional status  Therapy/Group: Individual Therapy  Simonne Come 06/06/2015, 12:09 PM

## 2015-06-06 NOTE — Progress Notes (Signed)
Physical Therapy Session Note  Patient Details  Name: Jamie [name] MRN: [id number] Date of Birth: 10/26/1956  Today's Date: 06/06/2015 PT Individual Time: 1000-1034 PT Individual Time Calculation (min): 34 min   Short Term Goals: Week 2:  PT Short Term Goal 1 (Week 2): Pt will perform bed mobility (R/L rolling, scooting to [facility]) with supervision and min cues PT Short Term Goal 2 (Week 2): Pt will perform bed <> w/c transfer with supervision and min cues for navigation due to visual impairments PT Short Term Goal 3 (Week 2): Pt will perform gait with LRAD x250' with supervision and min cues for navigation due to visual impairments PT Short Term Goal 4 (Week 2): Pt will perform ascent/descent of 6 6-inch stairs with minA and min cues PT Short Term Goal 5 (Week 2): Pt will improve sitting tolerance in standard w/c to 2 hrs   Skilled Therapeutic Interventions/Progress Updates:    Pt received in seated in w/c with c/o pain as described below; agreeable to treatment. Pt instructed in gait training using RW and supervision for 2 trials of approximately 100 ft including ascent/descent of ramp. During gait training therapist applied tactile cues to improve weight shifting and BLE step length, however poor carryover between trials and pt unable to maintain without cueing or assistance, stating "old habits are hard to break". Pt states gait pattern currently is very similar to performance prior to admission, only change is reduced balance due to neuropathy. Between trials pt performed ascent/descent of 5 6-inch stairs with R handrail and L HHA on ascent (opposite on descent). Performed with slow speed and requires occasional cues for upright posture and foot placement due to visual impairments. Pt very hesitant to perform stairs and requires increased time to perform. Pt returned to room, transferred into recliner chair due to pt report of discomfort in sacrum and upcoming hour-long speech therapy session. Seated  in w/c and all needs within reach at completion of session.   Therapy Documentation Precautions:  Precautions Precautions: Fall, Back Required Braces or Orthoses: Spinal Brace Spinal Brace: Thoracolumbosacral orthotic Other Brace/Splint: Ordered PRN Restrictions Weight Bearing Restrictions: No Pain: Pain Assessment Pain Assessment: 0-10 Pain Score: 7  Pain Type: Acute pain Pain Location: Sacrum Pain Orientation: Mid Pain Descriptors / Indicators: Aching Pain Onset: On-going Patients Stated Pain Goal: 3 Pain Intervention(s): Ambulation/increased activity Multiple Pain Sites: No Locomotion : Ambulation Ambulation/Gait Assistance: 5: Supervision   See FIM for current functional status  Therapy/Group: Individual Therapy  Luberta Mutter 06/06/2015, 10:43 AM

## 2015-06-07 ENCOUNTER — Inpatient Hospital Stay (HOSPITAL_COMMUNITY): Payer: Medicare Other | Admitting: Physical Therapy

## 2015-06-07 ENCOUNTER — Inpatient Hospital Stay (HOSPITAL_COMMUNITY): Payer: Medicare Other | Admitting: Speech Pathology

## 2015-06-07 ENCOUNTER — Inpatient Hospital Stay (HOSPITAL_COMMUNITY): Payer: Medicare Other | Admitting: *Deleted

## 2015-06-07 ENCOUNTER — Inpatient Hospital Stay (HOSPITAL_COMMUNITY): Payer: Medicare Other | Admitting: Occupational Therapy

## 2015-06-07 LAB — CREATININE, SERUM
Creatinine, Ser: 0.49 mg/dL (ref 0.44–1.00)
GFR calc non Af Amer: >60 mL/min (ref >60)

## 2015-06-07 NOTE — Progress Notes (Signed)
Physical Therapy Session Note  Patient Details  Name: Jamie [name] MRN: [id number] Date of Birth: 12/24/1955  Today's Date: 06/07/2015 PT Individual Time: 0900-0946 PT Individual Time Calculation (min): 46 min   Short Term Goals: Week 2:  PT Short Term Goal 1 (Week 2): Pt will perform bed mobility (R/L rolling, scooting to [facility]) with supervision and min cues PT Short Term Goal 2 (Week 2): Pt will perform bed <> w/c transfer with supervision and min cues for navigation due to visual impairments PT Short Term Goal 3 (Week 2): Pt will perform gait with LRAD x250' with supervision and min cues for navigation due to visual impairments PT Short Term Goal 4 (Week 2): Pt will perform ascent/descent of 6 6-inch stairs with minA and min cues PT Short Term Goal 5 (Week 2): Pt will improve sitting tolerance in standard w/c to 2 hrs   Skilled Therapeutic Interventions/Progress Updates:    AM session: Pt received seated in w/c with c/o pain as described below and agreeable to treatment. Pt instructed in gait on treadmill with LiteGait system. Performed at 0.8 mph for 8 min prior to pt requesting seated rest break, as well as reporting she may have gone to the bathroom. Upon initiating gait pt described pain in R sacral area that moves to L side after 1-2 min, and dissipates after 3-4 min. Pt educated in discomfort/stretching that may occur following prolonged sitting, as well as educated pt in gradually increasing gait/activity tolerance for greater time/distances. Pt returned to room and instructed in standing balance while changing brief. Pt performs donning/doffing pants with minA and requires maxA for donning brief. Pt instructed to tie string on pants without assistance and pt is able to perform however requires maxA to maintain standing balance. Pt instructed in gait training in hallway with RW and supervision; pt demonstrates improved carry over of increased step length and gait speed from trial on treadmill,  requiring reduced cueing and manual facilitation. Pt returned to room and seated in w/c at completion of session with all needs within reach.   PM session: Pt received in w/c with c/o pain as described below (no change from AM session); agreeable to treatment. Pt taken to therapy gym with maxA (therapist pushes w/c) with goal of performing LE stretching on mat table to A with balance and gait. Upon arrival to gym and pt. While transferring w/c >mat table with minA pt has incontinent episode. Transferred back to w/c with supervision, and pt returned to room maxA. Pt instructed in standing balance to change clothes and change brief. Pt able to don/doff pants with minA once pants are to knee level (unable to pull up from ground). In seated position pt performed threading pants and donning socks with supervision and increased time to perform. Cued to use BUEs to A with donning socks due to difficulty with one hand. Pt instructed in transfer w/c >bed with supervision. In R sidelying, therapist attempted to perform hip flexor stretch to LLE, however patient very tense as well as having difficulty maintaining sidelying position in air mattress. Instructed pt in rolling to prone to perform PROM to BLE hip flexors, however pt unable to move into prone position likely due to compliant surface. Plan to attempt next treatment session on mat table. Pt returned to w/c with supervision. Remained seated in w/c at completion of session with all needs within reach.  Therapy Documentation Precautions:  Precautions Precautions: Fall, Back Required Braces or Orthoses: Spinal Brace Spinal Brace: Thoracolumbosacral orthotic Other  Brace/Splint: Ordered PRN Restrictions Weight Bearing Restrictions: No  Pain: Pain Assessment Pain Assessment: 0-10 Pain Score: 7  Pain Type: Acute pain Pain Location: Sacrum Pain Orientation: Mid Pain Descriptors / Indicators: Aching Pain Onset: On-going Patients Stated Pain Goal: 3 Pain  Intervention(s): Ambulation/increased activity Multiple Pain Sites: No Locomotion : Ambulation Ambulation/Gait Assistance: 5: Supervision   See FIM for current functional status  Therapy/Group: Individual Therapy  Luberta Mutter 06/07/2015, 12:27 PM

## 2015-06-07 NOTE — Progress Notes (Signed)
Subjective/Complaints: Patient asking to give birthday blessing, she has been asking this every morning. Pain complaints would like to discuss pros and cons of fentanyl patch  ROS: + decreased sensation for BM nausea, vomiting, abdominal pain, diarrhea, chest pain, shortness of breath, palpitations, + urinary retention, dizziness,   bleeding, anxiety, or depression   Objective: Vital Signs: Blood pressure 97/56, pulse 84, temperature 98.3 F (36.8 C), temperature source Oral, resp. rate 17, height 5' 4" (1.626 m), weight 60.646 kg (133 lb 11.2 oz), SpO2 94 %. No results found. Results for orders placed or performed during the hospital encounter of 05/24/15 (from the past 72 hour(s))  Creatinine, serum     Status: None   Collection Time: 06/07/15  7:05 AM  Result Value Ref Range   Creatinine, Ser 0.49 0.44 - 1.00 mg/dL   GFR calc non Af Amer >60 >60 mL/min   GFR calc Af Amer >60 >60 mL/min    Comment: (NOTE) The eGFR has been calculated using the CKD EPI equation. This calculation has not been validated in all clinical situations. eGFR's persistently <60 mL/min signify possible Chronic Kidney Disease.      HEENT: normal Cardio: RRR and no murmur Resp: CTA B/L and unlabored GI: BS positive and nontender nondistended Extremity:  No Edema Skin:   Intact Neuro: Alert/Oriented . Abnormal  normal ankle plantar flexion dorsiflexion as well as hip flexion and knee extension.upper extremity strength is 4+ bilaterally Blind.  Musc/Skel:  LB tender, sacral tenderness, no thoracic spine tenderness Gen. no acute distress,  Mood and affect are bright and alert   Assessment/Plan: 1. 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 Physiatrist is providing close  team supervision and 24 hour management of active medical problems listed below. Physiatrist and rehab team continue to assess barriers to discharge/monitor patient progress toward functional and medical goals. Team conference in the morning FIM: FIM - Bathing Bathing Steps Patient Completed: Chest, Right Arm, Left Arm, Abdomen, Front perineal area, Buttocks, Right upper leg, Left upper leg Bathing: 4: Min-Patient completes 8-9 73f10 parts or 75+ percent  FIM - Upper Body Dressing/Undressing Upper body dressing/undressing steps patient completed: Thread/unthread right bra strap, Thread/unthread left bra strap, Hook/unhook bra, Thread/unthread right sleeve of pullover shirt/dresss, Thread/unthread left sleeve of pullover shirt/dress, Put head through opening of pull over shirt/dress, Pull shirt over trunk Upper body dressing/undressing: 5: Set-up assist to: Obtain clothing/put away FIM - Lower Body Dressing/Undressing Lower body dressing/undressing steps patient completed: Thread/unthread right pants leg, Thread/unthread left pants leg, Don/Doff right sock, Don/Doff left sock, Don/Doff right shoe, Don/Doff left shoe, Pull pants up/down Lower body dressing/undressing: 4: Steadying Assist  FIM - Toileting Toileting steps completed by patient: Adjust clothing prior to toileting, Adjust clothing after toileting Toileting Assistive Devices: Grab bar or rail for support Toileting: 3: Mod-Patient completed 2 of 3 steps  FIM - TRadio producerDevices: GProduct managerTransfers: 5-To toilet/BSC: Supervision (verbal cues/safety issues), 5-From toilet/BSC: Supervision (verbal cues/safety issues)  FIM - BControl and instrumentation engineerDevices: WCopy 5: Supine > Sit: Supervision (verbal cues/safety issues), 4: Bed > Chair or W/C: Min A (steadying Pt. > 75%)  FIM - Locomotion: Wheelchair Locomotion: Wheelchair: 2: Travels 50 - 149 ft  with minimal assistance (Pt.>75%) FIM - Locomotion:  Ambulation Locomotion: Ambulation Assistive Devices: Walker - Rolling Ambulation/Gait Assistance: 5: Supervision Locomotion: Ambulation: 5: Travels 150 ft or more with supervision/safety issues  Comprehension Comprehension Mode: Auditory Comprehension: 5-Understands complex 90% of the time/Cues < 10% of the time  Expression Expression Mode: Verbal Expression: 5-Expresses complex 90% of the time/cues < 10% of the time  Social Interaction Social Interaction: 5-Interacts appropriately 90% of the time - Needs monitoring or encouragement for participation or interaction.  Problem Solving Problem Solving: 4-Solves basic 75 - 89% of the time/requires cueing 10 - 24% of the time  Memory Memory: 5-Recognizes or recalls 90% of the time/requires cueing < 10% of the time  Medical Problem List and Plan: 1. 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  2. DVT Prophylaxis/Anticoagulation: Pharmaceutical: Lovenox 3. Pain Management: resumed Oxycodone. Continue lidoderm  -gabapentin    -may bring in cervical pillow for comfort 4. Mood: Continue prozac.  5. Neuropsych: This patient is capable of making decisions on her own behalf. 6. Skin/Wound Care: Routine pressure relief measure.   air mattress overlay to help with pressure relief as well as pain.  7. Fluids/Electrolytes/Nutrition: intake fair at present   - 8. Urinary retention: consider voiding trial next week. Also Continue flomax.  9. E. Coli UTI: Antibiotics complete.monitor for signs of recurrence 10. Constipation: continue Miralax bid with suppository daily. 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   12. Neurogenic bladder recommend voiding trial, patient has no signs of urinary retention but is incontinent of bladder 13. Neurogenic bowel continue Dulcolax suppositories, LOS (Days) 14 A FACE TO FACE  EVALUATION WAS PERFORMED  KIRSTEINS,ANDREW E 06/07/2015, 8:57 AM

## 2015-06-07 NOTE — Progress Notes (Signed)
Social Work Patient ID: Jamie [name], female   DOB: Mar 30, 1956, 59 y.o.   MRN: [id number] Met with pt and spoke with her parents to inform [facility] has offered a bed for Friday. This is pt's first choice and has accepted. Will coordinate the completion of the paperwork, between [facility] and pt's father.  He has reported they are having car problems and hope to have it back By Thursday.  Otherwise can have pt sign the papers herself. All pleased with the plan for Friday. Pt is making good progress with her bladder and bowels function.

## 2015-06-07 NOTE — Progress Notes (Signed)
Speech Language Pathology Discharge Summary  Patient Details  Name: Jamie [name] MRN: [id number] Date of Birth: 11/24/1956  Today's Date: 06/07/2015 SLP Individual Time: 1105-1202 SLP Individual Time Calculation (min): 57 min   Skilled Therapeutic Interventions:  Skilled treatment session focused on addressing cognitive goals. SLP facilitated session by providing overall Supervision verbal cues for attention to topic and task throughout session.  At times, patient was able to alternate attention and recall where she had left off with increased time.  Patient also required cuing x1 to direct caregiver with basic problem solving tasks related to self-care.  Patient able to verbalize adequate anticipatory awareness with regards to discharge planning; as a result, all goals met.     Patient has met 4 of 4 long term goals.  Patient to discharge at overall Supervision level.  Reasons goals not met: n/s   Clinical Impression/Discharge Summary:   Patient has made functional gains during this rehab admission and has met 4 of 4 long term goals due to improved functional abilities.  Patient is currently an overall Supervision assist for basic cognitive tasks and requires 24/7 supervision, which she will have at her next level of care. Patient education has been completed; patient confirms that she has returned to baseline.   Care Partner:  Caregiver Able to Provide Assistance: Other (comment) (awaiting SNF )  Type of Caregiver Assistance: Physical;Cognitive  Recommendation:  24 hour supervision/assistance;Skilled Nursing facility  Rationale for SLP Follow Up: Other (comment) (n/a)   Equipment: none   Reasons for discharge: Treatment goals met   Patient/Family Agrees with Progress Made and Goals Achieved: Yes   See FIM for current functional status  Melissa Bowie, M.A., CCC-SLP [phone]  BOWIE,MELISSA 06/07/2015, 12:21 PM    

## 2015-06-07 NOTE — Progress Notes (Signed)
Occupational Therapy Session Note  Patient Details  Name: Jamie [name] MRN: [id number] Date of Birth: 1956/11/06  Today's Date: 06/07/2015 OT Individual Time: 8127-5170 OT Individual Time Calculation (min): 60 min    Short Term Goals: Week 2:  OT Short Term Goal 1 (Week 2): Pt will complete shower transfer with RW with min assist OT Short Term Goal 2 (Week 2): Pt will complete bathing with min assist at sit > stand level OT Short Term Goal 3 (Week 2): Pt will complete LB dressing with min assist at sit > stand level OT Short Term Goal 4 (Week 2): Pt will tolerate standing for 5 mins without rest break to increase participation in ADLs  Skilled Therapeutic Interventions/Progress Updates:    Engaged in ADL retraining with focus on sit <> stand, standing tolerance, and increased attention to task with self-care tasks.  Pt completed bathing and dressing at sit > stand level at sink with focus on increased sitting and standing tolerance.  Pt continues to require cues to locate items secondary to visual impairments, continue to encourage pt to return items to same place to increase independence of locating items.  Pt tolerated standing 3-4 mins to complete perineal hygiene and allow therapist to don incontinence brief.  Pt incontinent of urine during bathing requiring additional assist to clean.  Setup for breakfast with opening containers, cutting sausage, and cues to locate items initially then progressing to locating items and self-feeding without assist.  Pt left seated in tilt-in-space w/c to finish breakfast.    Therapy Documentation Precautions:  Precautions Precautions: Fall, Back Required Braces or Orthoses: Spinal Brace Spinal Brace: Thoracolumbosacral orthotic Other Brace/Splint: Ordered PRN Restrictions Weight Bearing Restrictions: No General:   Vital Signs: Therapy Vitals Temp: 98.3 F (36.8 C) Temp Source: Oral Pulse Rate: 84 Resp: 17 BP: (!) 97/56 mmHg Patient Position (if  appropriate): Lying Oxygen Therapy SpO2: 94 % O2 Device: Not Delivered Pain: Pain Assessment Pain Assessment: 0-10 Pain Score: 3  Pain Type: Acute pain Pain Location: Sacrum Pain Intervention(s): Medication (See eMAR)  See FIM for current functional status  Therapy/Group: Individual Therapy  Simonne Come 06/07/2015, 8:25 AM

## 2015-06-07 NOTE — Progress Notes (Signed)
Subjective/Complaints: Patient bright and alert this morning. No new issues overnight Voiding but is incontinent, needs a bowel program but has poor sensation for bowel ROS: + decreased sensation for BM nausea, vomiting, abdominal pain, diarrhea, chest pain, shortness of breath, palpitations, + urinary retention, dizziness,   bleeding, anxiety, or depression   Objective: Vital Signs: Blood pressure 97/56, pulse 84, temperature 98.3 F (36.8 C), temperature source Oral, resp. rate 17, height 5\' 4"  (1.626 m), weight 60.646 kg (133 lb 11.2 oz), SpO2 94 %. No results found. No results found for this or any previous visit (from the past 72 hour(s)).   HEENT: normal Cardio: RRR and no murmur Resp: CTA B/L and unlabored GI: BS positive and nontender nondistended Extremity:  No Edema Skin:   Intact Neuro: Alert/Oriented with good insight and awareness. Abnormal Sensory the patient now has sensation to light touch on the plantar surface of both feet Weakness with toe flexion and extension,  normal ankle plantar flexion dorsiflexion as well as hip flexion and knee extension.upper extremity strength is 4+ bilaterally Blind.  Musc/Skel:  LB tender Gen. no acute distress, frail appearing   Assessment/Plan: 1. 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 Physiatrist is providing close team supervision and 24 hour management of active medical problems listed below. Physiatrist and rehab team continue to assess barriers to discharge/monitor patient progress toward functional and medical goals. Team conference in a.m. FIM: FIM - Bathing Bathing Steps Patient Completed: Chest, Right Arm, Left Arm, Abdomen, Front perineal area, Buttocks, Right upper leg, Left upper  leg Bathing: 4: Min-Patient completes 8-9 60f 10 parts or 75+ percent  FIM - Upper Body Dressing/Undressing Upper body dressing/undressing steps patient completed: Thread/unthread right bra strap, Thread/unthread left bra strap, Hook/unhook bra, Thread/unthread right sleeve of pullover shirt/dresss, Thread/unthread left sleeve of pullover shirt/dress, Put head through opening of pull over shirt/dress, Pull shirt over trunk Upper body dressing/undressing: 5: Set-up assist to: Obtain clothing/put away FIM - Lower Body Dressing/Undressing Lower body dressing/undressing steps patient completed: Thread/unthread right pants leg, Thread/unthread left pants leg, Don/Doff right sock, Don/Doff left sock, Don/Doff right shoe, Don/Doff left shoe, Pull pants up/down Lower body dressing/undressing: 4: Steadying Assist  FIM - Toileting Toileting steps completed by patient: Adjust clothing prior to toileting, Adjust clothing after toileting Toileting Assistive Devices: Grab bar or rail for support Toileting: 3: Mod-Patient completed 2 of 3 steps  FIM - Radio producer Devices: Product manager Transfers: 5-To toilet/BSC: Supervision (verbal cues/safety issues), 5-From toilet/BSC: Supervision (verbal cues/safety issues)  FIM - Control and instrumentation engineer Devices: Copy: 5: Supine > Sit: Supervision (verbal cues/safety issues), 4: Bed > Chair or W/C: Min A (steadying Pt. > 75%)  FIM - Locomotion: Wheelchair Locomotion: Wheelchair: 2: Travels 50 - 149 ft with minimal assistance (Pt.>75%) FIM - Locomotion: Ambulation Locomotion: Ambulation Assistive Devices: Administrator Ambulation/Gait Assistance: 5: Supervision Locomotion: Ambulation: 5: Travels 150 ft or more with supervision/safety issues  Comprehension Comprehension Mode: Auditory Comprehension: 5-Understands complex 90% of the time/Cues < 10% of the  time  Expression Expression Mode: Verbal Expression: 5-Expresses complex 90% of the time/cues < 10% of the time  Social Interaction Social Interaction: 5-Interacts appropriately 90% of the time - Needs monitoring or encouragement for participation or interaction.  Problem  Solving Problem Solving: 4-Solves basic 75 - 89% of the time/requires cueing 10 - 24% of the time  Memory Memory: 5-Recognizes or recalls 90% of the time/requires cueing < 10% of the time  Medical Problem List and Plan: 1. 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  2. DVT Prophylaxis/Anticoagulation: Pharmaceutical: Lovenox 3. Pain Management: resumed Oxycodone. Continue lidoderm  -gabapentin    4. Mood: Continue prozac.  5. Neuropsych: This patient is capable of making decisions on her own behalf. 6. Skin/Wound Care: Routine pressure relief measure.   air mattress overlay to help with pressure relief as well as pain.  7. Fluids/Electrolytes/Nutrition: intake fair at present   - 8. Urinary retention: consider voiding trial next week. Also Continue flomax.  9. E. Coli UTI: Antibiotics complete. 10. Constipation: continue Miralax bid with suppository daily. 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   12. Neurogenic bladder recommend voiding trial, patient has no signs of urinary retention but is incontinent of bladder 13. Neurogenic bowel continue Dulcolax suppositories, LOS (Days) 14 A FACE TO FACE EVALUATION WAS PERFORMED  KIRSTEINS,ANDREW E 06/07/2015, 8:25 AM

## 2015-06-07 NOTE — Progress Notes (Signed)
Recreational Therapy Session Note  Patient Details  Name: Jamie [name] MRN: [id number] Date of Birth: May 03, 1956 Today's Date: 06/07/2015  Pain: no c/o Skilled Therapeutic Interventions/Progress Updates: Pt requesting to go outside today as it have positive effect on her mood.  Once outside, pt discussing discharge plans specifically about going to SNF for continued therapies.  Questions answered and emotional support providing.  Encouraged self advocacy and gave examples as to how she could direct her care as she continues through her rehab process.  Therapy/Group: Individual Therapy   Dezmond Downie,Annjeanette 06/07/2015, 12:27 PM

## 2015-06-08 ENCOUNTER — Inpatient Hospital Stay (HOSPITAL_COMMUNITY): Payer: Medicare Other | Admitting: Physical Therapy

## 2015-06-08 ENCOUNTER — Inpatient Hospital Stay (HOSPITAL_COMMUNITY): Payer: Medicare Other | Admitting: Occupational Therapy

## 2015-06-08 MED ORDER — CALCIUM POLYCARBOPHIL 625 MG PO TABS
1250.0000 mg | ORAL_TABLET | Freq: Every day | ORAL | Status: DC
  Administered 2015-06-08 – 2015-06-10 (×3): 1250 mg via ORAL
  Filled 2015-06-08 (×5): qty 2

## 2015-06-08 NOTE — Progress Notes (Signed)
Recreational Therapy Session Note  Patient Details  Name: Jamie [name] MRN: [id number] Date of Birth: [birth year] Today's Date: 06/08/2015  Pain: no c/o Skilled Therapeutic Interventions/Progress Updates: Pt participated in animal assisted therapy seated in tilt in space w/c with set up assist with focus on dynamic sitting balance.     Therapy/Group: Individual Therapy   Erisha Paugh,[place] 06/08/2015, 3:40 PM

## 2015-06-08 NOTE — Progress Notes (Signed)
Subjective/Complaints: Patient asking to give birthday blessing, she has been asking this every morning. Pain is not an issue in therapy, but when asked she states it is 8/10  ROS: + decreased sensation for BM, negative nausea, vomiting, abdominal pain, diarrhea, chest pain, shortness of breath, palpitations, + urinary retention, dizziness,   bleeding, anxiety, or depression   Objective: Vital Signs: Blood pressure 101/64, pulse 82, temperature 98.3 F (36.8 C), temperature source Oral, resp. rate 17, height 5' 4"  (1.626 m), weight 60.646 kg (133 lb 11.2 oz), SpO2 98 %. No results found. Results for orders placed or performed during the hospital encounter of 05/24/15 (from the past 72 hour(s))  Creatinine, serum     Status: None   Collection Time: 06/07/15  7:05 AM  Result Value Ref Range   Creatinine, Ser 0.49 0.44 - 1.00 mg/dL   GFR calc non Af Amer >60 >60 mL/min   GFR calc Af Amer >60 >60 mL/min    Comment: (NOTE) The eGFR has been calculated using the CKD EPI equation. This calculation has not been validated in all clinical situations. eGFR's persistently <60 mL/min signify possible Chronic Kidney Disease.      HEENT: normal Cardio: RRR and no murmur Resp: CTA B/L and unlabored GI: BS positive and nontender nondistended Extremity:  No Edema Skin:   Intact Neuro: Alert/Oriented . Abnormal  normal ankle plantar flexion dorsiflexion as well as hip flexion and knee extension.upper extremity strength is 4+ bilaterally Blind.  Musc/Skel:  LB tender, sacral tenderness, no thoracic spine tenderness Gen. no acute distress,  Mood and affect are bright and alert   Assessment/Plan: 1. 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 Physiatrist is  providing close team supervision and 24 hour management of active medical problems listed below. Physiatrist and rehab team continue to assess barriers to discharge/monitor patient progress toward functional and medical goals. Team conference today please see physician documentation under team conference tab, met with team face-to-face to discuss problems,progress, and goals. Formulized individual treatment plan based on medical history, underlying problem and comorbidities. FIM: FIM - Bathing Bathing Steps Patient Completed: Chest, Right Arm, Left Arm, Abdomen, Front perineal area, Buttocks, Right upper leg, Left upper leg Bathing: 4: Min-Patient completes 8-9 53f10 parts or 75+ percent  FIM - Upper Body Dressing/Undressing Upper body dressing/undressing steps patient completed: Thread/unthread right bra strap, Thread/unthread left bra strap, Hook/unhook bra, Thread/unthread right sleeve of pullover shirt/dresss, Thread/unthread left sleeve of pullover shirt/dress, Put head through opening of pull over shirt/dress, Pull shirt over trunk Upper body dressing/undressing: 5: Set-up assist to: Obtain clothing/put away FIM - Lower Body Dressing/Undressing Lower body dressing/undressing steps patient completed: Thread/unthread right pants leg, Thread/unthread left pants leg, Don/Doff right sock, Don/Doff left sock, Don/Doff right shoe, Don/Doff left shoe, Pull pants up/down Lower body dressing/undressing: 4: Steadying Assist  FIM - Toileting Toileting steps completed by patient: Adjust clothing after toileting, Adjust clothing prior to toileting Toileting Assistive Devices: Grab bar or rail for support Toileting: 3: Mod-Patient completed 2 of 3 steps  FIM - TRadio producerDevices: Grab bars Toilet Transfers: 5-To toilet/BSC: Supervision (verbal cues/safety issues), 5-From toilet/BSC: Supervision (verbal cues/safety issues)  FIM - BBuyer, retailDevices: WAdult nurseTransfer: 5: Supine > Sit: Supervision (verbal cues/safety issues), 5:  Bed > Chair or W/C: Supervision (verbal cues/safety issues)  FIM - Locomotion: Wheelchair Locomotion: Wheelchair: 2: Travels 50 - 149 ft with minimal assistance (Pt.>75%) FIM - Locomotion: Ambulation Locomotion: Ambulation Assistive Devices: Administrator Ambulation/Gait Assistance: 5: Supervision Locomotion: Ambulation: 5: Travels 150 ft or more with supervision/safety issues  Comprehension Comprehension Mode: Auditory Comprehension: 5-Understands complex 90% of the time/Cues < 10% of the time  Expression Expression Mode: Verbal Expression: 5-Expresses complex 90% of the time/cues < 10% of the time  Social Interaction Social Interaction: 5-Interacts appropriately 90% of the time - Needs monitoring or encouragement for participation or interaction.  Problem Solving Problem Solving: 5-Solves basic 90% of the time/requires cueing < 10% of the time  Memory Memory: 5-Recognizes or recalls 90% of the time/requires cueing < 10% of the time  Medical Problem List and Plan: 1. 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  2. DVT Prophylaxis/Anticoagulation: Pharmaceutical: Lovenox 3. Pain Management: resumed Oxycodone. Continue lidoderm  -gabapentin    -may bring in cervical pillow for comfort 4. Mood: Continue prozac.  5. Neuropsych: This patient is capable of making decisions on her own behalf. 6. Skin/Wound Care: Routine pressure relief measure.   air mattress overlay to help with pressure relief as well as pain.  7. Fluids/Electrolytes/Nutrition: intake fair at present   - 8. Urinary retention: consider voiding trial next week. Also Continue flomax.  9. [name]. Coli UTI: Antibiotics complete.monitor for signs of recurrence 10. Constipation: continue Miralax bid with suppository daily. 11.  Neurosarcoidosis: Legally blind.   12. Neurogenic bladder recommend voiding trial, patient has no signs of urinary retention but is incontinent of bladder 13. Neurogenic bowel continue Dulcolax suppositories, LOS (Days) 15 A FACE TO FACE EVALUATION WAS PERFORMED  Jamie [name] 06/08/2015, 10:52 AM

## 2015-06-08 NOTE — Patient Care Conference (Signed)
Inpatient RehabilitationTeam Conference and Plan of Care Update Date: 06/08/2015   Time: 11;15 AM    Patient Name: Jamie [name]      Medical Record Number: [id number]  Date of Birth: 29-Nov-1956 Sex: Female         Room/Bed: 4W03C/4W03C-01 Payor Info: Payor: MEDICARE / Plan: MEDICARE PART A AND B / Product Type: *No Product type* /    Admitting Diagnosis: sacral fracture with neuroenic bowel and bladder  Admit Date/Time:  05/24/2015  2:59 PM Admission Comments: No comment available   Primary Diagnosis:  Sacral nerve root injury Principal Problem: Sacral nerve root injury  Patient Active Problem List   Diagnosis Date Noted  . 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 Neurogenic bowel 05/24/2015  . 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 Hypotonic neurogenic bladder   . Leg weakness   . Sacral fracture, closed 05/21/2015  . 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 UTI (lower urinary tract infection) 05/21/2015  . Urinary retention 05/21/2015    Expected Discharge Date: Expected Discharge Date: 06/10/15  Team Members Present: Physician leading conference: Dr. Alysia Penna Social Worker Present: Ovidio Kin, LCSW Nurse Present: Other (comment) Aileen Pilot Schmitz-RN) PT Present: Raylene Everts, PT;Caroline Lacinda Axon, PT;Other (comment) Sanjuana Kava) OT Present: Clyda Greener, OT;Julia Saguier, Jules Schick, OT SLP Present: Windell Moulding, SLP PPS Coordinator present : Daiva Nakayama, RN, CRRN     Current Status/Progress Goal Weekly Team Focus  Medical   Severe numbness in the perineal area, neurogenic bowel and bladder, paresthesia by Dr.'s and posterior thigh  Reduce fall risk  Discharge planning   Bowel/Bladder   Incontinent of bowel. LBM 06/07/15 after bowel program. Incontinent of bladder  Managed bowel and bladder  Medication to bulk up stool   Swallow/Nutrition/ Hydration     na        ADL's   supervision-min assist bathing and dressing, min assist transfers at w/c level   min-supervision  pain management, sitting balance, standing balance, transfers, self-care retraining   Mobility   CGA/supervision transfers, gait, bed mobility. mod/madA standing balance without UE support  Supervision all transfers, gait, stairs; minA w/c due to visual impairments  activity tolerance, gait quality, standing balance   Communication     na        Safety/Cognition/ Behavioral Observations    no unsafe behaviors        Pain   Oxy IR 5mg  q 4hrs prn, Lidocaine patch for sacrum discomfort  <3  Assess effectiveness of medication   Skin   Abrasion to R knee, OTA, unremarkable  No additional skin breakdown  Encourage to turn q 2hrs      *See Care Plan and progress notes for long and short-term goals.  Barriers to Discharge: Elderly parents    Possible Resolutions to Barriers:  SNF placement    Discharge Planning/Teaching Needs:  Parents and pt have decided she will need NHP upon discharge from rehab, Bed at [facility] for Friday.      Team Discussion:  Making gain in therapy-less pain. Voiding now but incontinent of bladder and bowel-due dot decreased sensation and control. Medically ready for transfer to NH on Friday  Revisions to Treatment Plan:  NHP   Continued Need for Acute Rehabilitation Level of Care: The patient requires daily medical management by a physician with specialized training in physical medicine and rehabilitation for the following conditions: Daily direction of a multidisciplinary physical rehabilitation program to ensure safe treatment while eliciting the highest outcome that is of practical value  to the patient.: Yes Daily medical management of patient stability for increased activity during participation in an intensive rehabilitation regime.: Yes Daily analysis of laboratory values and/or radiology reports with any subsequent need for medication adjustment of medical intervention for : Neurological problems;Other  Jamie [name], Jamie [name] 06/09/2015,  11:01 AM

## 2015-06-08 NOTE — Discharge Summary (Signed)
Physician Discharge Summary  Patient ID: Jamie [name] MRN: [id number] DOB/AGE: Jul 30, 1956 59 y.o.  Admit date: 05/24/2015 Discharge date: 06/10/2015  Discharge Diagnoses:  Principal Problem:   Sacral nerve root injury Active Problems:   Sacral fracture, closed   Neurosarcoidosis   Neurogenic bowel   Neurogenic bladder, flaccid   Hypotonic neurogenic bladder   Discharged Condition: Stable   Labs:  Basic Metabolic Panel:    Component Value Date/Time   NA 140 05/25/2015 0757   K 4.0 05/25/2015 0757   CL 104 05/25/2015 0757   CO2 26 05/25/2015 0757   GLUCOSE 83 05/25/2015 0757   BUN 13 05/25/2015 0757   CREATININE 0.49 06/07/2015 0705   CALCIUM 8.9 05/25/2015 0757   GFRNONAA >60 06/07/2015 0705   GFRAA >60 06/07/2015 0705     CBC: CBC Latest Ref Rng 05/25/2015 05/21/2015 05/20/2015  WBC 4.0 - 10.5 K/uL 6.4 7.8 7.4  Hemoglobin 12.0 - 15.0 g/dL 10.6(L) 9.9(L) 10.8(L)  Hematocrit 36.0 - 46.0 % 30.7(L) 29.0(L) 31.4(L)  Platelets 150 - 400 K/uL 337 342 360     CBG: No results for input(s): GLUCAP in the last 168 hours.  Brief HPI:   Jamie [name] is a 59 year old female with h/o Neurosarcoidosis with diffuse peripheral neuropathy, progressive gait disorder and significant visual impairment; who sustained a fall on 05/09/15 with onset of sacral pain, bowel and bladder incontinence, difficulty walking with recurrent falls and inability to care for herself.  and bowel incontinence and difficulty walking. She was re-evaluated in ED on 05/20/15 after recurrent fall and MRI Lumbar spine showed sacral fracture S1-S2 with mild angulation and some edema and hemorrhage in the area. Thoracic spine MRI demonstrated a small T10 superior endplate compression which appeared acute.  No surgical intervention needed per Dr. Kathyrn Sheriff and Dr. Lorin Mercy.  TLSO for support by Dr. Lorin Mercy. Neurology consulted for instability of gait with workup felt to be suspect nonfocal concussion like trauma to the spinal  cord as no compressive lesions noted. She has had issues with neuropathy BLE as well as urinary retention requiring foley placement. She was started on rocephin for E coli UTI. She has had improvement in BLE strength but continue to be limited due to pain. CIR was recommended for follow up therapy.    Hospital Course: Jamie [name] was admitted to rehab 05/24/2015 for inpatient therapies to consist of PT, ST and OT at least three hours five days a week. Past admission physiatrist, therapy team and rehab RN have worked together to provide customized collaborative inpatient rehab. She continued to be limited by pain with inability to tolerate TLSO. Dr. Kathyrn Sheriff evaluated thoracic films and recommended that TLSO be used prn for comfort. Pain control improved with addition of  lidocaine patch to sacral area and change of hydrocodone to oxycodone prn. Neurontin was added to help with neuropathic pain in sacrum and BLE. She has had improvement in participation as well as progress with improvement in pain management.  She was started on bowel program to help manage incontinence. She has completed a week course of rocephin for treatment of UTI. Foley was discontinued on 06/20 and voiding trial was initiated. She is voiding without signs of retention and as mobility improved. She is currently incontinent of bladder and PVR show no signs of retention.  She is incontinent of urine due to spastic bladder. She continues to require daily am suppository to prevent bowel incontinence.  Po intake is good and mood has been stable. She has  shown good motivation and has progressed to supervision level during her stay in rehab. Family is unable to provide assistance needed and has elected on further therapy at SNF. Patient was discharged to [facility] on 06/24 in improved condition.    Rehab course: During patient's stay in rehab weekly team conferences were held to monitor patient's progress, set goals and discuss barriers to  discharge. At admission, patient required total assist with basic self care tasks and min assist with mobility. She has had improvement in activity tolerance, balance, postural control, as well as ability to compensate for deficits. Speech therapy has worked with patient on selective attention, self monitoring for verbosity and topic maintenance as well as focus on basic and familiar tasks. She requires steady assist for bathing and set up for upper body dressing.   She requires min assist with LB dressing and shower transfers. She requires supervision and ambulation in community setting with RW. She is able to ambulate 225- 400 feet with decreased right foot clearance and shuffling on left foot. She is   She requires min assist when in community setting and to navigate stairs.     Disposition:  Skilled Nursing Facility  Diet: Regular  Special Instructions: 1. Toilet patient every four hours while awake. 2. Continue dulcolax suppository with dig stim as bowel program to help prevent incontinence.  3. Assist with meal set up.     Medication List    STOP taking these medications        furosemide 40 MG tablet  Commonly known as:  LASIX     HYDROcodone-acetaminophen 5-325 MG per tablet  Commonly known as:  NORCO/VICODIN     polyethylene glycol powder powder  Commonly known as:  MIRALAX     potassium chloride 10 MEQ tablet  Commonly known as:  K-DUR     tamsulosin 0.4 MG Caps capsule  Commonly known as:  FLOMAX      TAKE these medications        acetaminophen 325 MG tablet  Commonly known as:  TYLENOL  Take 1-2 tablets (325-650 mg total) by mouth every 4 (four) hours as needed for mild pain.     bisacodyl 10 MG suppository  Commonly known as:  DULCOLAX  Place 1 suppository (10 mg total) rectally daily at 6 (six) AM.     FLUoxetine 40 MG capsule  Commonly known as:  PROZAC  Take 40 mg by mouth daily.     gabapentin 100 MG capsule  Commonly known as:  NEURONTIN  Take 1  capsule (100 mg total) by mouth 3 (three) times daily.     levothyroxine 100 MCG tablet  Commonly known as:  SYNTHROID, LEVOTHROID  Take 100 mcg by mouth daily.     lidocaine 5 %  Commonly known as:  LIDODERM  Place 1 patch onto the skin daily. Place on sacrum at 6 pm and remove at 6 am daily     methocarbamol 500 MG tablet  Commonly known as:  ROBAXIN  Take 1 tablet (500 mg total) by mouth every 6 (six) hours as needed for muscle spasms.     multivitamin with minerals Tabs tablet  Take 1 tablet by mouth daily.     Oxycodone HCl 10 MG Tabs--Rx 15 pills  Take 1 tablet (10 mg total) by mouth every 6 (six) hours as needed for moderate pain.     polycarbophil 625 MG tablet  Commonly known as:  FIBERCON  Take 2 tablets (1,250 mg total)  by mouth daily.       Follow-up Information    Follow up with Charlett Blake, MD On 07/12/2015.   Specialty:  Physical Medicine and Rehabilitation   Why:  Be there at 9:30 for 9:45 am appointment    Contact information:   [address] [place] [address] [phone]       Follow up with Consuella Lose, C, MD. Call today.   Specialty:  Neurosurgery   Why:  for follow up appointment.    Contact information:   [address] [place] [address] [place] [address] [phone]       Signed: Bary Leriche 06/10/2015, 9:51 AM

## 2015-06-08 NOTE — Progress Notes (Addendum)
Physical Therapy Session Note  Patient Details  Name: Jamie [name] MRN: [id number] Date of Birth: 1956-02-09  Today's Date: 06/08/2015 PT Individual Time: 0900-1030 and 1430-1530 PT Individual Time Calculation (min): 90 min and 60 min  Short Term Goals: Week 2:  PT Short Term Goal 1 (Week 2): Pt will perform bed mobility (R/L rolling, scooting to [facility]) with supervision and min cues PT Short Term Goal 2 (Week 2): Pt will perform bed <> w/c transfer with supervision and min cues for navigation due to visual impairments PT Short Term Goal 3 (Week 2): Pt will perform gait with LRAD x250' with supervision and min cues for navigation due to visual impairments PT Short Term Goal 4 (Week 2): Pt will perform ascent/descent of 6 6-inch stairs with minA and min cues PT Short Term Goal 5 (Week 2): Pt will improve sitting tolerance in standard w/c to 2 hrs   Skilled Therapeutic Interventions/Progress Updates:    AM session: Pt received seated in w/c with c/o pain as described below and agreeable to treatment. Pt instructed in standing therapeutic activity including standing balance and bimanual task to A with changing brief and donning/doffing pants. In therapy gym pt instructed in LE stretching including prone lying to address tight hip flexors, also with manual quad/hip flexor stretch applied by therapist. Performed supine hamstring stretch. To perform rolling supine <> prone required minA for LE placement and verbal cues to facilitate trunk rotation and LE positioning. On treadmill, utilized LiteGait system to improve pt confidence for gait training. Pt performed 2 trials x5:30 min with 449-453 ft. Pt requires extended seated rest breaks after each trial. During performance, pt demonstrates improved gait speed, stride length and upright posture compared to overground walking. Following trial with LiteGait pt instructed in overground walking with RW and supervision, with moderate carryover of improved gait  qualities however continues to required mod verbal cues to maintain. Pt returned to room and remained seated in w/c with all needs within reach at completion of session.   PM session: Pt received in w/c with reports of pain as described below (no change from AM session); pt agreeable to treatment. Pt instructed in standing balance activity while performing LE dressing to A with changing brief. Pt able to don/doff pants without assistance, however requires inc time and one UE on RW at all times due to impaired balance. Brief donned and hygiene performed with total A. Pt instructed in sit <>stand transfer x3 trials with supervision while therapist changed cushion covers due to BM leak. Pt instructed in stair ascent/descent of 5 stairs 6" height with R handrail and L HHA with minA. Pt returned to room due to report of another BM; pt again instructed in standing balance activity while performing donning/doffing pants and total A fo donning brief and hygiene. Pt returned to w/c and remained seated with all needs within reach at completion of session.   Therapy Documentation Precautions:  Precautions Precautions: Fall, Back Required Braces or Orthoses: Spinal Brace Spinal Brace: Thoracolumbosacral orthotic Other Brace/Splint: Ordered PRN Restrictions Weight Bearing Restrictions: No Pain: Pain Assessment Pain Assessment: 0-10 Pain Score: 7  Pain Type: Acute pain Pain Location: Sacrum Pain Orientation: Mid Pain Descriptors / Indicators: Aching Pain Onset: On-going Patients Stated Pain Goal: 4 Pain Intervention(s): Ambulation/increased activity Multiple Pain Sites: No Locomotion : Ambulation Ambulation/Gait Assistance: 5: Supervision   See FIM for current functional status  Therapy/Group: Individual Therapy  Luberta Mutter 06/08/2015, 11:07 AM

## 2015-06-08 NOTE — Progress Notes (Signed)
Social Work Patient ID: Jamie [name], female   DOB: [birth year], 59 y.o.   MRN: [id number] Met wife and spoke with parents via telephone regarding team conference and the plan for Friday. If parent scan not get here due to car issues will have pt sing herself into the facility on Friday. Pt is agreeable to this and have father's blessing about this. Pt is pleased with her progress and is so glad she came here. Will work toward transfer for Friday to [facility].

## 2015-06-08 NOTE — Progress Notes (Signed)
Occupational Therapy Session Note  Patient Details  Name: Jamie [name] MRN: [id number] Date of Birth: 11/27/1956  Today's Date: 06/08/2015 OT Individual Time: 0730-0830 OT Individual Time Calculation (min): 60 min    Short Term Goals: Week 2:  OT Short Term Goal 1 (Week 2): Pt will complete shower transfer with RW with min assist OT Short Term Goal 2 (Week 2): Pt will complete bathing with min assist at sit > stand level OT Short Term Goal 3 (Week 2): Pt will complete LB dressing with min assist at sit > stand level OT Short Term Goal 4 (Week 2): Pt will tolerate standing for 5 mins without rest break to increase participation in ADLs  Skilled Therapeutic Interventions/Progress Updates:    Engaged in ADL retraining with focus on sit <> stand, standing tolerance, and increased attention to task during self-care tasks. Pt finishing breakfast upon arrival requiring cues to attend to task to complete self-feeding.  Stand pivot transfer bed > w/c with close supervision.  Bathing and dressing completed at sit > stand level at sink with focus on increased sitting and standing tolerance.  Pt incontinent of bowel and bladder, therapist assisted with hygiene standing at sink.  Pt tolerated standing 5 mins this session during hygiene and LB dressing.  Pt continues to require cues to locate items secondary to visual impairments and decreased recall of placement.  Pt pleased with plan to d/c to SNF and reports ready for next step.  Therapy Documentation Precautions:  Precautions Precautions: Fall, Back Required Braces or Orthoses: Spinal Brace Spinal Brace: Thoracolumbosacral orthotic Other Brace/Splint: Ordered PRN Restrictions Weight Bearing Restrictions: No General:   Vital Signs: Therapy Vitals Temp: 98.3 F (36.8 C) Temp Source: Oral Pulse Rate: 82 Resp: 17 BP: 101/64 mmHg Patient Position (if appropriate): Lying Oxygen Therapy SpO2: 98 % O2 Device: Not Delivered Pain: Pain  Assessment Pain Assessment: 0-10 Pain Score: Asleep Pain Type: Acute pain Pain Location: Sacrum Pain Descriptors / Indicators: Aching Pain Intervention(s): Medication (See eMAR)  See FIM for current functional status  Therapy/Group: Individual Therapy  Simonne Come 06/08/2015, 9:16 AM

## 2015-06-09 ENCOUNTER — Inpatient Hospital Stay (HOSPITAL_COMMUNITY): Payer: Medicare Other | Admitting: Occupational Therapy

## 2015-06-09 ENCOUNTER — Encounter (HOSPITAL_COMMUNITY): Payer: Medicare Other | Admitting: *Deleted

## 2015-06-09 ENCOUNTER — Inpatient Hospital Stay (HOSPITAL_COMMUNITY): Payer: Medicare Other

## 2015-06-09 ENCOUNTER — Inpatient Hospital Stay (HOSPITAL_COMMUNITY): Payer: Medicare Other | Admitting: Physical Therapy

## 2015-06-09 NOTE — Progress Notes (Signed)
Occupational Therapy Session Note  Patient Details  Name: Jamie [name] MRN: [id number] Date of Birth: [birth year]  Today's Date: 06/09/2015 OT Individual Time: 3094-0768 and 1335-1430 OT Individual Time Calculation (min): 60 min and 55 min   Short Term Goals: Week 2:  OT Short Term Goal 1 (Week 2): Pt will complete shower transfer with RW with min assist OT Short Term Goal 2 (Week 2): Pt will complete bathing with min assist at sit > stand level OT Short Term Goal 3 (Week 2): Pt will complete LB dressing with min assist at sit > stand level OT Short Term Goal 4 (Week 2): Pt will tolerate standing for 5 mins without rest break to increase participation in ADLs  Skilled Therapeutic Interventions/Progress Updates:    1) Engaged in ADL retraining with focus on functional transfers, standing tolerance, and increased participation in self-care tasks.  Pt in bed upon arrival finishing breakfast.  Performed bed mobility and transfer flat bed > w/c with supervision.  Incontinent of BM, performed transfer on/off padded toilet seat with supervision and use of grab bar.  Ambulated to room shower with hand held assist and cues for setup of shower.  Bathing completed with min/steady assist when standing to wash buttocks and cues for thoroughness.  Dressing completed at sit > stand level with setup assist to obtain items and min/steady assist when pulling pants over hips.  Min cues for location of items on sink due to visual deficits.  Pt left seated in w/c at sink to complete grooming tasks.  2) Engaged in therapeutic activity with focus on functional transfers and w/c mobility in community environment.  Pt completed toilet transfers with supervision from w/c level with use of grab bar and padded toilet seat to increase comfort while seated on toilet.  Pt completed 3/3 toileting tasks with min/steady assist while pulling up incontinence brief and min cues due to visual impairments.  Pt requested to go outside,  willing to complete w/c mobility on unit, on/off elevator, and through automatic doorway with supervision/cues due to visual impairments.  Therapy Documentation Precautions:  Precautions Precautions: Fall, Back Required Braces or Orthoses: Spinal Brace Spinal Brace: Thoracolumbosacral orthotic Other Brace/Splint: Ordered PRN Restrictions Weight Bearing Restrictions: No General:   Vital Signs: Therapy Vitals Temp: 98.8 F (37.1 C) Temp Source: Oral Pulse Rate: 96 Resp: 20 BP: 115/72 mmHg Patient Position (if appropriate): Sitting Oxygen Therapy SpO2: 100 % O2 Device: Not Delivered Pain: Pt with no c/o pain this session.  See FIM for current functional status  Therapy/Group: Individual Therapy  Simonne Come 06/09/2015, 8:28 AM

## 2015-06-09 NOTE — Progress Notes (Signed)
Physical Therapy Discharge Summary  Patient Details  Name: Jamie [name] MRN: [id number] Date of Birth: 09/12/1956  Today's Date: 06/09/2015 PT Individual Time: 0900-1030 PT Individual Time Calculation (min): 90 min    Patient has met 9 of 9 long term goals due to improved activity tolerance, improved balance, improved postural control, increased strength, increased range of motion, decreased pain, ability to compensate for deficits, improved awareness and improved coordination.  Patient to discharge at a wheelchair level with Supervision for community distances, and at ambulatory level with supervision for short household distances, transfers, and stairs using RW/handrails.   Patient's care partner unavailable and (family not present for any therapy sessions, however pt discharging to SNF so caregiver training not required at this time) to provide the necessary physical and cognitive assistance at discharge.  Reasons goals not met: All goals met  Recommendation:  Patient will benefit from ongoing skilled PT services in skilled nursing facility setting to continue to advance safe functional mobility, address ongoing impairments in standing balance, activity tolerance, LE strength, coordination, ROM, and minimize fall risk.  Equipment: No equipment provided  Reasons for discharge: treatment goals met and discharge from hospital  Patient/family agrees with progress made and goals achieved: Yes  PT Discharge Precautions/Restrictions Precautions Precautions: Fall;Back Required Braces or Orthoses: Spinal Brace Spinal Brace: Thoracolumbosacral orthotic Other Brace/Splint: Ordered PRN Restrictions Weight Bearing Restrictions: No Vital Signs   Pain Pain Assessment Pain Assessment: 0-10 Pain Score: 6  Pain Type: Acute pain Pain Location: Back Pain Orientation: Mid;Lower Pain Descriptors / Indicators: Burning Pain Onset: On-going Patients Stated Pain Goal: 5 Pain Intervention(s):  Medication (See eMAR) Vision/Perception    Vision impaired at baseline Cognition Overall Cognitive Status: Within Functional Limits for tasks assessed Arousal/Alertness: Awake/alert Orientation Level: Oriented X4 Attention: Selective Sustained Attention: Appears intact Sensation Sensation Light Touch: Impaired Detail (Diminished BLE's along S1 dematome) Light Touch Impaired Details: Impaired RLE;Impaired LLE Stereognosis: Not tested Hot/Cold: Not tested Proprioception: Appears Intact (9/10 accurate LLE, 10/10 accurate RLE hallux) Coordination Gross Motor Movements are Fluid and Coordinated: No Fine Motor Movements are Fluid and Coordinated: Yes Motor  Motor Motor: Other (comment) (BLEs hip flexion 4-/5, knee ext 4+/5, knee flex 4-/5, DF 4+/5, PF 4-/5)  Mobility Bed Mobility Bed Mobility: Sit to Supine;Supine to Sit Supine to Sit: 5: Supervision Supine to Sit Details: Verbal cues for technique Sit to Supine: 5: Supervision Sit to Supine - Details: Verbal cues for precautions/safety Sit to Supine - Details (indicate cue type and reason): cues for safety near edge of bed due to visual impaiments Transfers Transfers: Yes Sit to Stand: 5: Supervision Sit to Stand Details: Verbal cues for safe use of DME/AE Stand to Sit: 5: Supervision Stand to Sit Details (indicate cue type and reason): Verbal cues for sequencing;Verbal cues for precautions/safety Stand Pivot Transfers: 5: Supervision Stand Pivot Transfer Details: Verbal cues for precautions/safety Stand Pivot Transfer Details (indicate cue type and reason): verbal cues for safety due to visual impairments Locomotion  Ambulation Ambulation: Yes Ambulation/Gait Assistance: 5: Supervision Ambulation Distance (Feet): 225 Feet Assistive device: Rolling walker Ambulation/Gait Assistance Details: Verbal cues for precautions/safety Ambulation/Gait Assistance Details: cues for safety and navigation due to visual  impairments Gait Gait: Yes Gait Pattern: Impaired Gait Pattern: Poor foot clearance - right;Poor foot clearance - left;Wide base of support;Decreased step length - right;Decreased step length - left;Shuffle;Left flexed knee in stance;Right flexed knee in stance Gait velocity: 1.025 ft/sec Stairs / Additional Locomotion Stairs: Yes Stairs Assistance: 5: Supervision Stairs Assistance Details:  Verbal cues for precautions/safety Stairs Assistance Details (indicate cue type and reason): d/t visual impairments Stair Management Technique: Two rails;Alternating pattern Number of Stairs: 16 Height of Stairs: 6 Ramp: 5: Psychiatric nurse: Yes Wheelchair Assistance: 5: Investment banker, operational Details: Verbal cues for precautions/safety;Verbal cues for safe use of DME/AE Wheelchair Propulsion: Both upper extremities Wheelchair Parts Management: Needs assistance Distance: 175  Trunk/Postural Assessment  Cervical Assessment Cervical Assessment: Within Functional Limits Thoracic Assessment Thoracic Assessment: Within Functional Limits Lumbar Assessment Lumbar Assessment: Within Functional Limits Postural Control Postural Control: Deficits on evaluation Protective Responses: Slow speed and force production for protective reactions Postural Limitations: inc postural sway, unable to regain indepedently   Balance Balance Balance Assessed: Yes Static Sitting Balance Static Sitting - Balance Support: No upper extremity supported;Feet unsupported Static Sitting - Level of Assistance: 5: Stand by assistance Static Sitting - Comment/# of Minutes: 5 min Dynamic Sitting Balance Dynamic Sitting - Balance Support: No upper extremity supported;Feet supported;During functional activity Dynamic Sitting - Level of Assistance: 5: Stand by assistance Dynamic Sitting - Balance Activities: Lateral lean/weight shifting;Reaching for objects;Reaching across  midline;Forward lean/weight shifting Static Standing Balance Static Standing - Balance Support: Bilateral upper extremity supported Static Standing - Level of Assistance: 5: Stand by assistance Static Standing - Comment/# of Minutes: > 10 min Dynamic Standing Balance Dynamic Standing - Balance Support: Bilateral upper extremity supported Dynamic Standing - Level of Assistance: 5: Stand by assistance Dynamic Standing - Balance Activities: Forward lean/weight shifting;Lateral lean/weight shifting Dynamic Standing - Comments: Unable to maintain standing balance without use of AD wth BUEs Extremity Assessment      RLE Assessment RLE Assessment: Exceptions to [facility] RLE AROM (degrees) Overall AROM Right Lower Extremity: Deficits RLE Overall AROM Comments: limited hip extension, knee extension ROM d/t dec muscle length iliopsoas/hamstrings RLE Strength RLE Overall Strength Comments: Hip flexion 4-/5, knee ext 4/5, knee flex 4-/5, DF 4+/5, PF 4-/5. Not limited by pain LLE Assessment LLE Assessment: Exceptions to [facility] LLE AROM (degrees) Overall AROM Left Lower Extremity: Deficits LLE Overall AROM Comments: limited hip extension, knee extension ROM due to dec muscle length in iliopsoas/hamstrings LLE Strength LLE Overall Strength Comments: Hip flexion 4-/5, knee ext 4/5, knee flex 4-/5, DF 4+/5, PF 4-/5. Not limited by pain  See FIM for current functional status  Skilled Therapeutic Intervention: Pt received in w/c with report of sacral pain as described above and agreeable to treatment. Pt instructed in standing balance with dynamic UE activity of assisting with lower body dressing following change of brief. Pt strength, sensation, coordination assessed, and instructed in all mobility as part of grad day assessment performed with supervision overall as described above. Pt instructed in Nustep x12 min on level 5 to improve LE strength, ROM and activity tolerance. Pt instructed in gait training x150  ft using RW and supervision. Pt seated in w/c at completion of session with all needs within reach.   Benjiman Core Tygielski 06/09/2015, 12:39 PM

## 2015-06-09 NOTE — Progress Notes (Signed)
Recreational Therapy Discharge Summary Patient Details  Name: Jamie [name] MRN: [id number] Date of Birth: 1956/11/30 Today's Date: 06/09/2015  Long term goals set: 1  Long term goals met: 1  Comments on progress toward goals: Pt has made good progress during LOS and is ready for discharge to SNF for continued therapies and 24 hour care.  Pt is currently supervision-set up assist level for simple TR tasks.  Education provided throughout LOS on self advocacy, activity modifications, and community resources. Reasons for discharge: discharge from hospital Patient/family agrees with progress made and goals achieved: Yes  [name],[name] 06/09/2015, 3:00 PM

## 2015-06-09 NOTE — Progress Notes (Signed)
Social Work Patient ID: Jamie [name], female   DOB: 17-Mar-1956, 59 y.o.   MRN: [id number] Met with pt and parents who are here to compete paperwork for [facility] for transfer tomorrow. All have been completed and pt is ready for transfer in am. All in agreemnt with plan tomorrow.

## 2015-06-09 NOTE — Progress Notes (Signed)
Social Work [name], LCSW Social Worker Signed  Patient Care Conference 06/08/2015  2:49 PM    Expand All Collapse All   Inpatient RehabilitationTeam Conference and Plan of Care Update Date: 06/08/2015   Time: 11;15 AM     Patient Name: Jamie [name]       Medical Record Number: [id number]  Date of Birth: 1956/05/28 Sex: Female         Room/Bed: 4W03C/4W03C-01 Payor Info: Payor: MEDICARE / Plan: MEDICARE PART A AND B / Product Type: *No Product type* /    Admitting Diagnosis: sacral fracture with neuroenic bowel and bladder  Admit Date/Time:  05/24/2015  2:59 PM Admission Comments: No comment available   Primary Diagnosis:  Sacral nerve root injury Principal Problem: Sacral nerve root injury    Patient Active Problem List     Diagnosis  Date Noted   .  Sacral nerve root injury  05/24/2015   .  Neurogenic bowel  05/24/2015   .  Neurogenic bladder, flaccid  05/24/2015   .  Hypotonic neurogenic bladder     .  Leg weakness     .  Sacral fracture, closed  05/21/2015   .  Neurosarcoidosis  05/21/2015   .  UTI (lower urinary tract infection)  05/21/2015   .  Urinary retention  05/21/2015     Expected Discharge Date: Expected Discharge Date: 06/10/15  Team Members Present: Physician leading conference: Dr. Alysia Penna Social Worker Present: Ovidio Kin, LCSW Nurse Present: Other (comment) Aileen Pilot Schmitz-RN) PT Present: Raylene Everts, PT;Caroline Lacinda Axon, PT;Other (comment) Sanjuana Kava) OT Present: Clyda Greener, OT;Julia Saguier, Jules Schick, OT SLP Present: Windell Moulding, SLP PPS Coordinator present : Daiva Nakayama, RN, CRRN        Current Status/Progress  Goal  Weekly Team Focus   Medical     Severe numbness in the perineal area, neurogenic bowel and bladder, paresthesia by Dr.'s and posterior thigh  Reduce fall risk  Discharge planning   Bowel/Bladder     Incontinent of bowel. LBM 06/07/15 after bowel program. Incontinent of bladder  Managed bowel and bladder   Medication to bulk up stool   Swallow/Nutrition/ Hydration       na         ADL's     supervision-min assist bathing and dressing, min assist transfers at w/c level  min-supervision  pain management, sitting balance, standing balance, transfers, self-care retraining   Mobility     CGA/supervision transfers, gait, bed mobility. mod/madA standing balance without UE support  Supervision all transfers, gait, stairs; minA w/c due to visual impairments  activity tolerance, gait quality, standing balance    Communication       na         Safety/Cognition/ Behavioral Observations      no unsafe behaviors         Pain     Oxy IR 5mg  q 4hrs prn, Lidocaine patch for sacrum discomfort  <3  Assess effectiveness of medication    Skin     Abrasion to R knee, OTA, unremarkable   No additional skin breakdown  Encourage to turn q 2hrs      *See Care Plan and progress notes for long and short-term goals.    Barriers to Discharge:  Elderly parents     Possible Resolutions to Barriers:   SNF placement     Discharge Planning/Teaching Needs:   Parents and pt have decided she will need NHP upon discharge from rehab, Bed at  [place] [facility] for Friday.       Team Discussion:    Making gain in therapy-less pain. Voiding now but incontinent of bladder and bowel-due dot decreased sensation and control. Medically ready for transfer to NH on Friday   Revisions to Treatment Plan:    NHP    Continued Need for Acute Rehabilitation Level of Care: The patient requires daily medical management by a physician with specialized training in physical medicine and rehabilitation for the following conditions: Daily direction of a multidisciplinary physical rehabilitation program to ensure safe treatment while eliciting the highest outcome that is of practical value to the patient.: Yes Daily medical management of patient stability for increased activity during participation in an intensive rehabilitation regime.:  Yes Daily analysis of laboratory values and/or radiology reports with any subsequent need for medication adjustment of medical intervention for : Neurological problems;Other  [name] 06/09/2015, 11:01 AM                 [name], LCSW Social Worker Signed  Patient Care Conference 06/01/2015  2:58 PM    Expand All Collapse All   Inpatient RehabilitationTeam Conference and Plan of Care Update Date: 06/01/2015   Time: 10;55 AM     Patient Name: Jamie [name]       Medical Record Number: [id number]  Date of Birth: April 12, 1956 Sex: Female         Room/Bed: 4W03C/4W03C-01 Payor Info: Payor: MEDICARE / Plan: MEDICARE PART A AND B / Product Type: *No Product type* /    Admitting Diagnosis: sacral fracture with neuroenic bowel and bladder  Admit Date/Time:  05/24/2015  2:59 PM Admission Comments: No comment available   Primary Diagnosis:  Sacral nerve root injury Principal Problem: Sacral nerve root injury    Patient Active Problem List     Diagnosis  Date Noted   .  Sacral nerve root injury  05/24/2015   .  Neurogenic bowel  05/24/2015   .  Neurogenic bladder, flaccid  05/24/2015   .  Hypotonic neurogenic bladder     .  Leg weakness     .  Sacral fracture, closed  05/21/2015   .  Neurosarcoidosis  05/21/2015   .  UTI (lower urinary tract infection)  05/21/2015   .  Urinary retention  05/21/2015     Expected Discharge Date: Expected Discharge Date: 06/10/15  Team Members Present: Physician leading conference: Dr. Alysia Penna Social Worker Present: Ovidio Kin, LCSW Nurse Present: Heather Roberts, RN PT Present: Raylene Everts, PT;Other (comment);Georjean Mode, PT (Lizzie Tygielski-PT) OT Present: Simonne Come, Maryella Shivers, OT SLP Present: Windell Moulding, SLP PPS Coordinator present : Daiva Nakayama, RN, CRRN        Current Status/Progress  Goal  Weekly Team Focus   Medical     cognitive deficits, ?NPH  reduce fall risk  NS eval   Bowel/Bladder     Foley for  urineary retention. Pt inont of bowel; neurogenic bowel and bladder  bladder managed with total assist and bowel managed with level 4 assist  attempt voiding trials   Swallow/Nutrition/ Hydration       na         ADL's     min-mod assist bathing, max assist LB dressing, setup UB dressing, min-mod assist transfers  min-supervision  pain management, sitting balance, standing balance, transfers, self-care retraining   Mobility     minA/CGA for transfers and gait, mod/maxA standing balance without UE support, reduced endurance and pain  Supervision all transfers, gait, stairs; minA w/c due to visual impairments  activity tolerance/endurance, LE strength, standing balance    Communication       na         Safety/Cognition/ Behavioral Observations      no unsafe behaviors         Pain     using oxycodone PRN and lidocaine patches   Pain managed with medications at or below level 6  assess need for pain medications. assess effectivness   Skin     abraisons to right knee  no further injury min assist  relef pressure to sacrum frequently. assess skin q shift       *See Care Plan and progress notes for long and short-term goals.    Barriers to Discharge:  family unable to help     Possible Resolutions to Barriers:   see above may need SNF     Discharge Planning/Teaching Needs:   Probably short term NHP due to will need care at discharge and has no one to provide this. Will meet with pt and parents this afternoon to discuss options       Team Discussion:    Pt is making progress slowly,pain more managed. SP to eval-cognitive issues. Have neuro-check NPH. DC foley today and see if can void. Working on a bowel program. Endurance better. Will need 24 hr care at discharge   Revisions to Treatment Plan:    Probable NHP    Continued Need for Acute Rehabilitation Level of Care: The patient requires daily medical management by a physician with specialized training in physical medicine and  rehabilitation for the following conditions: Daily direction of a multidisciplinary physical rehabilitation program to ensure safe treatment while eliciting the highest outcome that is of practical value to the patient.: Yes Daily medical management of patient stability for increased activity during participation in an intensive rehabilitation regime.: Yes Daily analysis of laboratory values and/or radiology reports with any subsequent need for medication adjustment of medical intervention for : Neurological problems;Other  [name] 06/02/2015, 8:44 AM                  Patient ID: [name], female   DOB: 29-Oct-1956, 59 y.o.   MRN: [id number]

## 2015-06-09 NOTE — Progress Notes (Signed)
Occupational Therapy Discharge Summary  Patient Details  Name: Jamie [name] MRN: [id number] Date of Birth: 26-Dec-1955  Patient has met 10 of 10 long term goals due to improved activity tolerance, improved balance, ability to compensate for deficits and improved awareness.  Patient to discharge at overall Supervision level, min assist with LB dressing and shower transfers.  Patient's care partner unavailable to provide the necessary physical and cognitive assistance at discharge.  Pt to d/c to SNF for further skilled treatment.  Reasons goals not met: NA  Recommendation:  Patient will benefit from ongoing skilled OT services in skilled nursing facility setting to continue to advance functional skills in the area of BADL, iADL and Reduce care partner burden.  Equipment: No equipment provided  Reasons for discharge: treatment goals met and discharge from hospital  Patient/family agrees with progress made and goals achieved: Yes  OT Discharge Precautions/Restrictions  Precautions Precautions: Fall;Back Required Braces or Orthoses: Spinal Brace Spinal Brace: Thoracolumbosacral orthotic Other Brace/Splint: Ordered PRN Restrictions Weight Bearing Restrictions: No Pain Pain Assessment Pain Assessment: 0-10 Pain Score: 6  Pain Type: Acute pain Pain Location: Back Pain Orientation: Mid;Lower Pain Descriptors / Indicators: Burning Pain Onset: On-going Patients Stated Pain Goal: 5 Pain Intervention(s): Medication (See eMAR) ADL  See FIM Vision/Perception  Vision- History Baseline Vision/History: Legally blind Patient Visual Report: No change from baseline  Cognition Overall Cognitive Status: Within Functional Limits for tasks assessed Arousal/Alertness: Awake/alert Orientation Level: Oriented X4 Attention: Selective Sustained Attention: Appears intact Sensation Sensation Light Touch: Impaired Detail (Diminished BLE's along S1 dematome) Light Touch Impaired Details: Impaired  RLE;Impaired LLE Stereognosis: Not tested Hot/Cold: Not tested Proprioception: Appears Intact (9/10 accurate LLE, 10/10 accurate RLE hallux) Coordination Gross Motor Movements are Fluid and Coordinated: No Fine Motor Movements are Fluid and Coordinated: Yes Motor  Motor Motor: Other (comment) (BLEs hip flexion 4-/5, knee ext 4+/5, knee flex 4-/5, DF 4+/5, PF 4-/5) Mobility  Bed Mobility Bed Mobility: Sit to Supine;Supine to Sit Supine to Sit: 5: Supervision Supine to Sit Details: Verbal cues for technique Sit to Supine: 5: Supervision Sit to Supine - Details: Verbal cues for precautions/safety Sit to Supine - Details (indicate cue type and reason): cues for safety near edge of bed due to visual impaiments Transfers Sit to Stand: 5: Supervision Sit to Stand Details: Verbal cues for safe use of DME/AE Stand to Sit: 5: Supervision Stand to Sit Details (indicate cue type and reason): Verbal cues for sequencing;Verbal cues for precautions/safety  Trunk/Postural Assessment  Cervical Assessment Cervical Assessment: Within Functional Limits Thoracic Assessment Thoracic Assessment: Within Functional Limits Lumbar Assessment Lumbar Assessment: Within Functional Limits Postural Control Postural Control: Deficits on evaluation Protective Responses: Slow speed and force production for protective reactions Postural Limitations: inc postural sway, unable to regain indepedently   Balance Balance Balance Assessed: Yes Static Sitting Balance Static Sitting - Balance Support: No upper extremity supported;Feet unsupported Static Sitting - Level of Assistance: 5: Stand by assistance Static Sitting - Comment/# of Minutes: 5 min Dynamic Sitting Balance Dynamic Sitting - Balance Support: No upper extremity supported;Feet supported;During functional activity Dynamic Sitting - Level of Assistance: 5: Stand by assistance Dynamic Sitting - Balance Activities: Lateral lean/weight shifting;Reaching for  objects;Reaching across midline;Forward lean/weight shifting Static Standing Balance Static Standing - Balance Support: Bilateral upper extremity supported Static Standing - Level of Assistance: 5: Stand by assistance Static Standing - Comment/# of Minutes: > 10 min Dynamic Standing Balance Dynamic Standing - Balance Support: Bilateral upper extremity supported Dynamic Standing - Level  of Assistance: 5: Stand by assistance Dynamic Standing - Balance Activities: Forward lean/weight shifting;Lateral lean/weight shifting Dynamic Standing - Comments: Unable to maintain standing balance without use of AD wth BUEs Extremity/Trunk Assessment RUE Assessment RUE Assessment: Exceptions to [facility] RUE AROM (degrees) RUE Overall AROM Comments: shoulder flexion grossly 100 degrees before reports of pain; elbow, wrist, and fingers WFL RUE PROM (degrees) RUE Overall PROM Comments: shoulder flexion grossly 120 degrees before reports of pain RUE Strength RUE Overall Strength Comments: strength grossly 4+/5 at bicep/tricep LUE Assessment LUE Assessment: Exceptions to [facility] LUE AROM (degrees) LUE Overall AROM Comments: shoulder flexion approx 100 degrees before reports of pain; elbow, wrist, and fingers WFL LUE Strength LUE Overall Strength Comments: strength grossly 4+/5 at bicep/tricep  See FIM for current functional status  Evangela Heffler, [facility] 06/09/2015, 12:54 PM

## 2015-06-09 NOTE — Progress Notes (Signed)
Subjective/Complaints: Patient asking about prognosis for recovery of bowel bladder and lower extremity numbness. Still has numbness in the back of her thighs in the buttocks area as well as bottom of the feet.  ROS: + decreased sensation for BM, negative nausea, vomiting, abdominal pain, diarrhea, chest pain, shortness of breath, palpitations, + urinary retention,    Objective: Vital Signs: Blood pressure 115/72, pulse 96, temperature 98.8 F (37.1 C), temperature source Oral, resp. rate 20, height _0  (1.626 m), weight 60.646 kg (133 lb 11.2 oz), SpO2 100 %. No results found. Results for orders placed or performed during the hospital encounter of 05/24/15 (from the past 72 hour(s))  Creatinine, serum     Status: None   Collection Time: 06/07/15  7:05 AM  Result Value Ref Range   Creatinine, Ser 0.49 0.44 - 1.00 mg/dL   GFR calc non Af Amer >60 >60 mL/min   GFR calc Af Amer >60 >60 mL/min    Comment: (NOTE) The eGFR has been calculated using the CKD EPI equation. This calculation has not been validated in all clinical situations. eGFR's persistently <60 mL/min signify possible Chronic Kidney Disease.      HEENT: normal Cardio: RRR and no murmur Resp: CTA B/L and unlabored GI: BS positive and nontender nondistended Extremity:  No Edema Skin:   Intact Neuro: Alert/Oriented . Abnormal  normal ankle plantar flexion dorsiflexion as well as hip flexion and knee extension.upper extremity strength is 4+ bilaterally Blind.  Musc/Skel:  LB tender, sacral tenderness, no thoracic spine tenderness Gen. no acute distress,  Mood and affect are bright and alert   Assessment/Plan: 1. 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 Physiatrist is providing close  team supervision and 24 hour management of active medical problems listed below. Physiatrist and rehab team continue to assess barriers to discharge/monitor patient progress toward functional and medical goals. Plan d/c to snf in am  FIM: FIM - Bathing Bathing Steps Patient Completed: Chest, Right Arm, Left Arm, Abdomen, Front perineal area, Buttocks, Right upper leg, Left upper leg Bathing: 4: Steadying assist  FIM - Upper Body Dressing/Undressing Upper body dressing/undressing steps patient completed: Thread/unthread right bra strap, Thread/unthread left bra strap, Hook/unhook bra, Thread/unthread right sleeve of pullover shirt/dresss, Thread/unthread left sleeve of pullover shirt/dress, Put head through opening of pull over shirt/dress, Pull shirt over trunk Upper body dressing/undressing: 5: Set-up assist to: Obtain clothing/put away FIM - Lower Body Dressing/Undressing Lower body dressing/undressing steps patient completed: Thread/unthread right pants leg, Thread/unthread left pants leg, Don/Doff right sock, Don/Doff left sock, Don/Doff right shoe, Don/Doff left shoe, Pull pants up/down Lower body dressing/undressing: 4: Steadying Assist  FIM - Toileting Toileting steps completed by patient: Adjust clothing after toileting, Adjust clothing prior to toileting, Performs perineal hygiene Toileting Assistive Devices: Grab bar or rail for support Toileting: 4: Steadying assist  FIM - Radio producer Devices: Product manager Transfers: 5-To toilet/BSC: Supervision (verbal cues/safety issues), 5-From toilet/BSC: Supervision (verbal cues/safety issues)  FIM - Control and instrumentation engineer Devices: Copy: 5: Supine > Sit: Supervision (verbal cues/safety issues), 5: Bed > Chair or W/C: Supervision (verbal cues/safety issues)  FIM - Locomotion: Wheelchair Locomotion: Wheelchair: 2: Travels 50 - 149 ft with minimal assistance  (Pt.>75%) FIM - Locomotion: Ambulation Locomotion: Ambulation Assistive Devices:  Walker - Rolling Ambulation/Gait Assistance: 5: Supervision Locomotion: Ambulation: 5: Travels 150 ft or more with supervision/safety issues  Comprehension Comprehension Mode: Auditory Comprehension: 5-Understands complex 90% of the time/Cues < 10% of the time  Expression Expression Mode: Verbal Expression: 5-Expresses complex 90% of the time/cues < 10% of the time  Social Interaction Social Interaction: 5-Interacts appropriately 90% of the time - Needs monitoring or encouragement for participation or interaction.  Problem Solving Problem Solving: 5-Solves basic 90% of the time/requires cueing < 10% of the time  Memory Memory: 5-Recognizes or recalls 90% of the time/requires cueing < 10% of the time  Medical Problem List and Plan: 1. 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  2. DVT Prophylaxis/Anticoagulation: Pharmaceutical: Lovenox 3. Pain Management: resumed Oxycodone. Continue lidoderm  -gabapentin    -may bring in cervical pillow for comfort 4. Mood: Continue prozac.  5. Neuropsych: This patient is capable of making decisions on her own behalf. 6. Skin/Wound Care: Routine pressure relief measure.   air mattress overlay to help with pressure relief as well as pain.  7. Fluids/Electrolytes/Nutrition: intake fair at present   - 8. Urinary retention: consider voiding trial next week. Also Continue flomax.  9. E. Coli UTI: Antibiotics complete.monitor for signs of recurrence 10. Constipation: continue Miralax bid with suppository daily. 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   12. Neurogenic bladder recommend voiding trial, patient has no signs of urinary retention but is incontinent of bladder 13. Neurogenic bowel continue Dulcolax suppositories, LOS (Days) 16 A FACE TO FACE EVALUATION WAS  PERFORMED  KIRSTEINS,ANDREW E 06/09/2015, 8:39 AM

## 2015-06-09 NOTE — Plan of Care (Signed)
Problem: SCI BOWEL ELIMINATION Goal: RH STG SCI MANAGE BOWEL WITH MEDICATION WITH ASSISTANCE STG SCI Manage bowel with medication with mod. assistance.  Outcome: Not Progressing Requires total assist for supp and dig stim

## 2015-06-10 DIAGNOSIS — E039 Hypothyroidism, unspecified: Secondary | ICD-10-CM | POA: Diagnosis not present

## 2015-06-10 DIAGNOSIS — R32 Unspecified urinary incontinence: Secondary | ICD-10-CM | POA: Diagnosis not present

## 2015-06-10 DIAGNOSIS — S3422XD Injury of nerve root of sacral spine, subsequent encounter: Secondary | ICD-10-CM | POA: Diagnosis not present

## 2015-06-10 DIAGNOSIS — Z9181 History of falling: Secondary | ICD-10-CM | POA: Diagnosis not present

## 2015-06-10 DIAGNOSIS — R5381 Other malaise: Secondary | ICD-10-CM | POA: Diagnosis not present

## 2015-06-10 DIAGNOSIS — R52 Pain, unspecified: Secondary | ICD-10-CM | POA: Diagnosis not present

## 2015-06-10 DIAGNOSIS — F329 Major depressive disorder, single episode, unspecified: Secondary | ICD-10-CM | POA: Diagnosis not present

## 2015-06-10 DIAGNOSIS — H548 Legal blindness, as defined in USA: Secondary | ICD-10-CM | POA: Diagnosis not present

## 2015-06-10 DIAGNOSIS — G629 Polyneuropathy, unspecified: Secondary | ICD-10-CM | POA: Diagnosis not present

## 2015-06-10 DIAGNOSIS — F322 Major depressive disorder, single episode, severe without psychotic features: Secondary | ICD-10-CM | POA: Diagnosis not present

## 2015-06-10 DIAGNOSIS — G609 Hereditary and idiopathic neuropathy, unspecified: Secondary | ICD-10-CM | POA: Diagnosis not present

## 2015-06-10 DIAGNOSIS — R279 Unspecified lack of coordination: Secondary | ICD-10-CM | POA: Diagnosis not present

## 2015-06-10 DIAGNOSIS — R339 Retention of urine, unspecified: Secondary | ICD-10-CM | POA: Diagnosis not present

## 2015-06-10 DIAGNOSIS — S3210XS Unspecified fracture of sacrum, sequela: Secondary | ICD-10-CM | POA: Diagnosis not present

## 2015-06-10 DIAGNOSIS — R159 Full incontinence of feces: Secondary | ICD-10-CM | POA: Diagnosis not present

## 2015-06-10 DIAGNOSIS — M6281 Muscle weakness (generalized): Secondary | ICD-10-CM | POA: Diagnosis not present

## 2015-06-10 DIAGNOSIS — N319 Neuromuscular dysfunction of bladder, unspecified: Secondary | ICD-10-CM | POA: Diagnosis not present

## 2015-06-10 DIAGNOSIS — D8689 Sarcoidosis of other sites: Secondary | ICD-10-CM | POA: Diagnosis not present

## 2015-06-10 DIAGNOSIS — E43 Unspecified severe protein-calorie malnutrition: Secondary | ICD-10-CM | POA: Diagnosis not present

## 2015-06-10 DIAGNOSIS — M792 Neuralgia and neuritis, unspecified: Secondary | ICD-10-CM | POA: Diagnosis not present

## 2015-06-10 DIAGNOSIS — S3219XD Other fracture of sacrum, subsequent encounter for fracture with routine healing: Secondary | ICD-10-CM | POA: Diagnosis not present

## 2015-06-10 DIAGNOSIS — D869 Sarcoidosis, unspecified: Secondary | ICD-10-CM | POA: Diagnosis not present

## 2015-06-10 DIAGNOSIS — S329XXA Fracture of unspecified parts of lumbosacral spine and pelvis, initial encounter for closed fracture: Secondary | ICD-10-CM | POA: Diagnosis not present

## 2015-06-10 DIAGNOSIS — N312 Flaccid neuropathic bladder, not elsewhere classified: Secondary | ICD-10-CM | POA: Diagnosis not present

## 2015-06-10 DIAGNOSIS — S3422XS Injury of nerve root of sacral spine, sequela: Secondary | ICD-10-CM | POA: Diagnosis not present

## 2015-06-10 DIAGNOSIS — S3210XD Unspecified fracture of sacrum, subsequent encounter for fracture with routine healing: Secondary | ICD-10-CM | POA: Diagnosis not present

## 2015-06-10 DIAGNOSIS — E46 Unspecified protein-calorie malnutrition: Secondary | ICD-10-CM | POA: Diagnosis not present

## 2015-06-10 DIAGNOSIS — N390 Urinary tract infection, site not specified: Secondary | ICD-10-CM | POA: Diagnosis not present

## 2015-06-10 DIAGNOSIS — R410 Disorientation, unspecified: Secondary | ICD-10-CM | POA: Diagnosis not present

## 2015-06-10 DIAGNOSIS — R2681 Unsteadiness on feet: Secondary | ICD-10-CM | POA: Diagnosis not present

## 2015-06-10 DIAGNOSIS — Z4789 Encounter for other orthopedic aftercare: Secondary | ICD-10-CM | POA: Diagnosis not present

## 2015-06-10 DIAGNOSIS — K592 Neurogenic bowel, not elsewhere classified: Secondary | ICD-10-CM | POA: Diagnosis not present

## 2015-06-10 MED ORDER — OXYCODONE HCL 10 MG PO TABS: 10.0000 mg | ORAL_TABLET | Freq: Four times a day (QID) | ORAL | Status: DC | PRN

## 2015-06-10 MED ORDER — BISACODYL 10 MG RE SUPP: 10.0000 mg | Freq: Every day | RECTAL | Status: DC

## 2015-06-10 MED ORDER — CALCIUM POLYCARBOPHIL 625 MG PO TABS
1250.0000 mg | ORAL_TABLET | Freq: Every day | ORAL | Status: DC
Start: 2015-06-10 — End: 2015-07-12

## 2015-06-10 MED ORDER — GABAPENTIN 100 MG PO CAPS: 100.0000 mg | ORAL_CAPSULE | Freq: Three times a day (TID) | ORAL | Status: DC

## 2015-06-10 MED ORDER — ADULT MULTIVITAMIN W/MINERALS CH: 1.0000 | ORAL_TABLET | Freq: Every day | ORAL | Status: AC

## 2015-06-10 MED ORDER — METHOCARBAMOL 500 MG PO TABS: 500.0000 mg | ORAL_TABLET | Freq: Four times a day (QID) | ORAL | Status: DC | PRN

## 2015-06-10 MED ORDER — ACETAMINOPHEN 325 MG PO TABS: 325.0000 mg | ORAL_TABLET | ORAL | Status: DC | PRN

## 2015-06-10 MED ORDER — LIDOCAINE 5 % EX PTCH: 1.0000 | MEDICATED_PATCH | CUTANEOUS | Status: DC

## 2015-06-10 NOTE — Progress Notes (Signed)
Subjective/Complaints: Pt aware of d/c  ROS: + decreased sensation for BM, negative nausea, vomiting, abdominal pain, diarrhea, chest pain, shortness of breath, palpitations, + urinary retention,    Objective: Vital Signs: Blood pressure 116/58, pulse 86, temperature 98.1 F (36.7 C), temperature source Oral, resp. rate 18, height 5\' 4"  (1.626 m), weight 60.646 kg (133 lb 11.2 oz), SpO2 100 %. Dg Chest 2 View  06/09/2015   CLINICAL DATA:  Routine checkup.  EXAM: CHEST  2 VIEW  COMPARISON:  None.  FINDINGS: The heart size and mediastinal contours are within normal limits. No pneumothorax or pleural effusion is noted. Minimal subsegmental atelectasis or scarring is noted laterally in right lung base. Mild subsegmental atelectasis or scarring is noted posteriorly in the left lower lobe. The visualized skeletal structures are unremarkable.  IMPRESSION: Bibasilar subsegmental atelectasis or scarring is noted, with left greater than right.   Electronically Signed   By: Marijo Conception, M.D.   On: 06/09/2015 15:51   No results found for this or any previous visit (from the past 72 hour(s)).   HEENT: normal Cardio: RRR and no murmur Resp: CTA B/L and unlabored GI: BS positive and nontender nondistended Extremity:  No Edema Skin:   Intact Neuro: Alert/Oriented . Abnormal  normal ankle plantar flexion dorsiflexion as well as hip flexion and knee extension.upper extremity strength is 4+ bilaterally Blind.  Musc/Skel:  LB tender, sacral tenderness, no thoracic spine tenderness Gen. no acute distress,  Mood and affect are bright and alert   Assessment/Plan: 1. Functional deficits secondary to sacral fracture with sacral nerve root injury causing neurogenic bowel and bladder as well as distal lower extremity numbness and weakness  Plan d/c to snf in am  FIM: FIM - Bathing Bathing Steps Patient Completed: Chest, Right Arm, Left Arm, Abdomen, Front perineal area, Buttocks, Right upper leg, Left  upper leg Bathing: 4: Steadying assist  FIM - Upper Body Dressing/Undressing Upper body dressing/undressing steps patient completed: Thread/unthread right bra strap, Thread/unthread left bra strap, Hook/unhook bra, Thread/unthread right sleeve of pullover shirt/dresss, Thread/unthread left sleeve of pullover shirt/dress, Put head through opening of pull over shirt/dress, Pull shirt over trunk Upper body dressing/undressing: 5: Set-up assist to: Obtain clothing/put away FIM - Lower Body Dressing/Undressing Lower body dressing/undressing steps patient completed: Thread/unthread right pants leg, Thread/unthread left pants leg, Don/Doff right sock, Don/Doff left sock, Don/Doff right shoe, Don/Doff left shoe, Pull pants up/down Lower body dressing/undressing: 4: Steadying Assist  FIM - Toileting Toileting steps completed by patient: Adjust clothing after toileting, Adjust clothing prior to toileting, Performs perineal hygiene Toileting Assistive Devices: Grab bar or rail for support Toileting: 4: Steadying assist  FIM - Radio producer Devices: Product manager Transfers: 5-To toilet/BSC: Supervision (verbal cues/safety issues), 5-From toilet/BSC: Supervision (verbal cues/safety issues)  FIM - Control and instrumentation engineer Devices: Copy: 5: Supine > Sit: Supervision (verbal cues/safety issues), 5: Sit > Supine: Supervision (verbal cues/safety issues), 5: Bed > Chair or W/C: Supervision (verbal cues/safety issues), 5: Chair or W/C > Bed: Supervision (verbal cues/safety issues)  FIM - Locomotion: Wheelchair Distance: 175 Locomotion: Wheelchair: 5: Travels 150 ft or more: maneuvers on rugs and over door sills with supervision, cueing or coaxing FIM - Locomotion: Ambulation Locomotion: Ambulation Assistive Devices: Administrator Ambulation/Gait Assistance: 5: Supervision Locomotion: Ambulation: 5: Travels 150 ft or more with  supervision/safety issues  Comprehension Comprehension Mode: Auditory Comprehension: 5-Understands basic 90% of the time/requires cueing < 10% of the  time  Expression Expression Mode: Verbal Expression: 5-Expresses basic 90% of the time/requires cueing < 10% of the time.  Social Interaction Social Interaction: 5-Interacts appropriately 90% of the time - Needs monitoring or encouragement for participation or interaction.  Problem Solving Problem Solving: 5-Solves basic 90% of the time/requires cueing < 10% of the time  Memory Memory: 5-Recognizes or recalls 90% of the time/requires cueing < 10% of the time  Medical Problem List and Plan: 1. 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  2. DVT Prophylaxis/Anticoagulation: Pharmaceutical: Lovenox 3. Pain Management: resumed Oxycodone. Continue lidoderm  -gabapentin    -may bring in cervical pillow for comfort 4. Mood: Continue prozac.  5. Neuropsych: This patient is capable of making decisions on her own behalf. 6. Skin/Wound Care: Routine pressure relief measure.   air mattress overlay to help with pressure relief as well as pain.  7. Fluids/Electrolytes/Nutrition: intake fair at present   - 8. Urinary retention: consider voiding trial next week. Also Continue flomax.  9. E. Coli UTI: Antibiotics complete.monitor for signs of recurrence 10. Constipation: continue Miralax bid with suppository daily. 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   12. Neurogenic bladder recommend voiding trial, patient has no signs of urinary retention but is incontinent of bladder 13. Neurogenic bowel continue Dulcolax suppositories, LOS (Days) 17 A FACE TO FACE EVALUATION WAS PERFORMED  Jamie [name] E 06/10/2015, 8:53 AM

## 2015-06-10 NOTE — Progress Notes (Signed)
Report called to [place] at [facility]. No questions noted. Transport due after lunch. Patient made aware and is ready to go. Margarito Liner

## 2015-06-10 NOTE — Progress Notes (Signed)
Social Work Discharge Note Discharge Note  The overall goal for the admission was met for:   Discharge location: No-[facility]  Length of Stay: Yes-17 DAYS  Discharge activity level: Yes-MIN ASSIST LEVEL  Home/community participation: Yes  Services provided included: MD, RD, PT, OT, SLP, RN, CM, TR, Pharmacy, Neuropsych and SW  Financial Services: Medicare  Follow-up services arranged: Other: NHP  Comments (or additional information):PT NEEDS TO RECOVER MORE BEFORE RETURNING HOME TO INDEPENDENT APARTMENT, MAY NEED ASSISTED LIVING AFTER NH STAY  Patient/Family verbalized understanding of follow-up arrangements: Yes  Individual responsible for coordination of the follow-up plan: SELF & DR Benyo- FATHER  Confirmed correct DME delivered: [name] 06/10/2015    [name]

## 2015-06-10 NOTE — Progress Notes (Signed)
Discharged to [facility] via ambulance. Margarito Liner

## 2015-06-10 NOTE — Progress Notes (Signed)
Social Work Patient ID: Jamie [name], female   DOB: 1956-07-06, 59 y.o.   MRN: [id number] Spoke with [facility] has discharge and bed will not be ready until after lunch. Informed pt and she will order lunch. Plan to transfer after lunch.

## 2015-06-13 ENCOUNTER — Non-Acute Institutional Stay (SKILLED_NURSING_FACILITY): Payer: Medicare Other | Admitting: Adult Health

## 2015-06-13 ENCOUNTER — Encounter: Payer: Self-pay | Admitting: Adult Health

## 2015-06-13 DIAGNOSIS — G629 Polyneuropathy, unspecified: Secondary | ICD-10-CM | POA: Diagnosis not present

## 2015-06-13 DIAGNOSIS — F32A Depression, unspecified: Secondary | ICD-10-CM

## 2015-06-13 DIAGNOSIS — N319 Neuromuscular dysfunction of bladder, unspecified: Secondary | ICD-10-CM

## 2015-06-13 DIAGNOSIS — E43 Unspecified severe protein-calorie malnutrition: Secondary | ICD-10-CM | POA: Diagnosis not present

## 2015-06-13 DIAGNOSIS — F329 Major depressive disorder, single episode, unspecified: Secondary | ICD-10-CM | POA: Diagnosis not present

## 2015-06-13 DIAGNOSIS — K592 Neurogenic bowel, not elsewhere classified: Secondary | ICD-10-CM

## 2015-06-13 DIAGNOSIS — E039 Hypothyroidism, unspecified: Secondary | ICD-10-CM | POA: Diagnosis not present

## 2015-06-13 DIAGNOSIS — S3210XS Unspecified fracture of sacrum, sequela: Secondary | ICD-10-CM | POA: Diagnosis not present

## 2015-06-13 NOTE — Progress Notes (Signed)
Patient ID: Jamie [name], female   DOB: 30-Jan-1956, 59 y.o.   MRN: [id number]   06/13/2015  Facility:  [facility]urs[facility] Home Location:  [place] Room Number: 601-U LEVEL OF CARE:  SNF (31)   Chief Complaint  Patient presents with  . Hospitalization Follow-up    Sacral fracture, neurogenic bladder, neurogenic bowel well, neuropathy, hypothyroidism, protein calorie malnutrition and depression    HISTORY OF PRESENT ILLNESS:  This is a 59 year old female was being admitted to [facility] on 06/10/15 from [facility] She had a fall on 5/23 with onset of sacral pain, bowel well/bladder incontinence, difficulty walking with recurrent falls and inability to care for self. ED evaluation on 6/3 showed MRI of lumbar showing sacral fractures S1-S2 with mild angulation and some edema and hemorrhage in the area. Thoracic spine MRI shows a small T10 superior endplate compression. No surgical intervention per neurosurgery and or to consult recommended TLSO for support. She had urinary retention and had Foley catheter placement. She was treated for Escherichia coli UTI with Rocephin. She had issues with neuropathy on bilateral lower extremity. She had inpatient rehabilitation. Foley catheter was discontinued on 6/20 and started to void without retention. She is now incontinent of urine due to spastic bladder.  She has been admitted for a short-term rehabilitation.   PAST MEDICAL HISTORY:  Past Medical History  Diagnosis Date  . 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 Progressive gait disorder     Was treated for probable MS until diagnosed with Neurosarcoidosis about 10 years ago.     CURRENT MEDICATIONS: Reviewed per MAR/see medication list  Allergies  Allergen Reactions  . Remicade [Infliximab]     shingles  . Penicillins Rash     REVIEW OF  SYSTEMS:  GENERAL: no change in appetite, no fatigue, no weight changes, no fever, chills or weakness RESPIRATORY: no cough, SOB, DOE, wheezing, hemoptysis CARDIAC: no chest pain, or palpitations GI: no abdominal pain, heart burn, nausea or vomiting GU: + Urinary incontinence NEURO:  + Numbness on bilateral lower extremity (sole of feet)  PHYSICAL EXAMINATION  GENERAL: no acute distress, normal body habitus EYES: conjunctivae normal, sclerae normal, normal eye lids NECK: supple, trachea midline, no neck masses, no thyroid tenderness, no thyromegaly LYMPHATICS: no LAN in the neck, no supraclavicular LAN RESPIRATORY: breathing is even & unlabored, BS CTAB CARDIAC: RRR, no murmur,no extra heart sounds, BLE edema 1+ GI: abdomen soft, normal BS, no masses, no tenderness, no hepatomegaly, no splenomegaly EXTREMITIES:  Able to move 4 extremities PSYCHIATRIC: the patient is alert & oriented to person, affect & behavior appropriate  LABS/RADIOLOGY: Labs reviewed: Basic Metabolic Panel:  Recent Labs  05/20/15 1807 05/21/15 0415 05/25/15 0757 05/31/15 0700 06/07/15 0705  NA 141 141 140  --   --   K 3.8 3.6 4.0  --   --   CL 105 107 104  --   --   CO2 28 25 26   --   --   GLUCOSE 87 138* 83  --   --   BUN 20 17 13   --   --   CREATININE 0.55 0.48 0.51 0.48 0.49  CALCIUM 9.2 8.8* 8.9  --   --   MG  --  1.9  --   --   --   PHOS  --  2.9  --   --   --  Liver Function Tests:  Recent Labs  05/20/15 1807 05/21/15 0415 05/25/15 0757  AST 25 22 21   ALT 28 22 15   ALKPHOS 144* 134* 218*  BILITOT 0.3 0.2* 0.3  PROT 6.8 6.0* 6.0*  ALBUMIN 3.4* 2.9* 2.7*   CBC:  Recent Labs  05/12/15 1030 05/20/15 1807 05/21/15 0415 05/25/15 0757  WBC 8.6 7.4 7.8 6.4  NEUTROABS 6.3 4.3  --  3.8  HGB 10.6* 10.8* 9.9* 10.6*  HCT 30.1* 31.4* 29.0* 30.7*  MCV 80.9 83.1 82.6 82.3  PLT 180 360 342 337    Dg Chest 2 View  06/09/2015   CLINICAL DATA:  Routine checkup.  EXAM: CHEST  2 VIEW   COMPARISON:  None.  FINDINGS: The heart size and mediastinal contours are within normal limits. No pneumothorax or pleural effusion is noted. Minimal subsegmental atelectasis or scarring is noted laterally in right lung base. Mild subsegmental atelectasis or scarring is noted posteriorly in the left lower lobe. The visualized skeletal structures are unremarkable.  IMPRESSION: Bibasilar subsegmental atelectasis or scarring is noted, with left greater than right.   Electronically Signed   By: Marijo Conception, M.D.   On: 06/09/2015 15:51   Ct Head Wo Contrast  05/20/2015   CLINICAL DATA:  Several falls during the past 2 weeks. No loss of consciousness. Generalized weakness.  EXAM: CT HEAD WITHOUT CONTRAST  TECHNIQUE: Contiguous axial images were obtained from the base of the skull through the vertex without intravenous contrast.  COMPARISON:  05/12/2015  FINDINGS: Similar findings of age advanced centralized atrophy with ex vacuo dilatation of the ventricular system, most conspicuously involving the fourth ventricle. Age advanced cerebellar volume loss with sulcal prominence. Scattered periventricular hypodensities compatible microvascular ischemic disease. Given background parenchymal abnormalities, there is no CT evidence of superimposed acute large territory infarct. No definite intraparenchymal or extra-axial mass or hemorrhage. Unchanged size and configuration of the ventricles and basilar cisterns. No midline shift. There is underpneumatization the bilateral frontal sinuses. Polypoid mucosal thickening of the right maxillary sinus. The remaining paranasal sinuses and mastoid air cells are normally aerated. No air-fluid levels. Regional soft tissues appear normal. No displaced calvarial fracture.  IMPRESSION: 1. Microvascular ischemic disease without definite acute intracranial process. 2. Similar findings of age advanced cerebellar and centralized atrophy with commensurate ex vacuo dilatation of the ventricular  system most conspicuously involving the fourth ventricle. Further evaluation could be performed with brain MRI as clinically indicated.   Electronically Signed   By: Sandi Mariscal M.D.   On: 05/20/2015 16:53   Mr Cervical Spine Wo Contrast  05/20/2015   CLINICAL DATA:  59 year old female with weakness and falling for the past 14 days. History of neurosarcoidosis. Numbness buttock region. Symptoms worsening over the past 3 days. Subsequent encounter.  EXAM: MRI CERVICAL SPINE WITHOUT CONTRAST  TECHNIQUE: Multiplanar, multisequence MR imaging of the cervical spine was performed. No intravenous contrast was administered.  COMPARISON:  05/20/2015 and 05/12/2015 head CT.  FINDINGS: Exam is motion degraded.  Abnormal appearance of the cerebellum with marked atrophy. Possibility of primary neurodegenerative disorder of the cerebellum is raised. Similar type findings can be seen in this setting of seizures, treatment of seizures, malnutrition or excess alcohol use. On CT, patient is also noted to have prominent lateral ventricles. Patient may benefit from contrast-enhanced MR for further delineation (would help evaluate for intracranial neurosarcoidosis changes).  Curvature of the cervical spine. No obvious osseous edema or soft tissue edema to suggest bony injury or soft tissue injury.  If bony injury is of high clinical concern, CT may then be considered for further delineation.  No focal cord signal abnormality.  Evaluation is otherwise limited secondary to the motion.  C2-3:  Negative.  C3-4:  Negative.  C4-5:  Bulge with mild spinal stenosis and minimal cord contact.  C5-6: Minimal to mild bulge. Minimal narrowing ventral aspect of the thecal sac.  C6-7:  Minimal bulge.  C7-T1:  Negative.  T1-2:  Negative.  T2-3:  Negative.  T3-4: Small Schmorl's node deformity superior endplate T4.  H6-3:  Limited imaging without obvious abnormality.  IMPRESSION: Exam is motion degraded.  Abnormal appearance of the cerebellum with  marked atrophy. Possibility of primary neurodegenerative disorder of the cerebellum is raised. Similar type findings can be seen in this setting of seizures, treatment of seizures, malnutrition or excess alcohol use. On CT, patient is also noted to have prominent lateral ventricles. Patient may benefit from contrast-enhanced MR for further delineation (would help evaluate for intracranial neurosarcoidosis changes).  Curvature of the cervical spine. No obvious osseous edema or soft tissue edema to suggest bony injury or soft tissue injury. If bony injury is of high clinical concern, CT may then be considered for further delineation.  No focal cord signal abnormality.  Evaluation is otherwise limited secondary to the motion.  C4-5 bulge with mild spinal stenosis and minimal cord contact.   Electronically Signed   By: Genia Del M.D.   On: 05/20/2015 21:33   Mr Lumbar Spine Wo Contrast  05/20/2015   CLINICAL DATA:  59 year old female with history of neuro sarcoidosis. Recent falls. Numbness buttock region. Subsequent encounter.  EXAM: MRI LUMBAR SPINE WITHOUT CONTRAST  TECHNIQUE: Multiplanar, multisequence MR imaging of the lumbar spine was performed. No intravenous contrast was administered.  COMPARISON:  05/12/2015 plain film exam.  FINDINGS: Last fully open disk space is labeled L5-S1. Present examination incorporates from T11-12 disc space through lower sacrum.  Exam is motion degraded.  Conus L1-2 level.  Fracture of the sacrum. This is centered at the S1-2 level with mild retropulsion and angulation at this level contributing to narrowing of the canal and contained nerve roots. Surrounding edema/hemorrhage.  T11-12 through L3-4 unremarkable.  L4-5: Mild disc degeneration. Bulge with small central disc protrusion. Minimal caudal extension. Slight impression ventral aspect of the thecal sac. Mild facet joint degenerative changes.  L5-S1:  Negative.  The bladder is full which may be related to patient not  urinating as no compression of the conus is noted although other causes for urinary retention are not excluded.  Multiple gallstones.  IMPRESSION: Exam is motion degraded.  Fracture of the sacrum. This is centered at the S1-2 level with mild retropulsion and angulation at this level contributing to narrowing of the canal and contained nerve roots. Surrounding edema/hemorrhage.  L4-5 mild disc degeneration. Bulge with small central disc protrusion. Minimal caudal extension. Slight impression ventral aspect of the thecal sac. Mild facet joint degenerative changes.  The bladder is full which may be related to patient not urinating as no compression of the conus is noted although other causes for urinary retention are not excluded.  Multiple gallstones.   Electronically Signed   By: Genia Del M.D.   On: 05/20/2015 22:03   Mr Thoracic Spine W Wo Contrast  05/21/2015   CLINICAL DATA:  [place].  Back pain.  EXAM: MRI THORACIC SPINE WITHOUT AND WITH CONTRAST  TECHNIQUE: Multiplanar and multiecho pulse sequences of the thoracic spine were obtained without and with intravenous  contrast.  CONTRAST:  61mL MULTIHANCE GADOBENATE DIMEGLUMINE 529 MG/ML IV SOLN  COMPARISON:  None.  FINDINGS: Normal alignment of the thoracic vertebral bodies. There is a mild superior endplate fracture of the T10 vertebral body. No retropulsion or canal compromise. The other thoracic vertebral bodies are normal. The thoracic spinal cord is normal. No cord lesions or syrinx. The facets are normally aligned. No foraminal lesions. No thoracic disc protrusions or canal stenosis.  IMPRESSION: Mild superior endplate fracture of O25 without retropulsion or canal compromise.   Electronically Signed   By: Marijo Sanes M.D.   On: 05/21/2015 13:49    ASSESSMENT/PLAN:  Sacral fracture - for rehabilitation; continue TLSO when necessary for support; lidocaine 5% 1 patch transdermally to sacrum daily and oxycodone 10 mg 1 tab by mouth every 6 hours when  necessary for pain  Neurogenic bladder - toilet patient every 4 hours while awake  Neurogenic bowel - Dulcolax suppository daily at 6 AM with digital stimulation as bowel program to help prevent incontinence and continue FiberCon 625 mg 2 tabs = 1250 mg by mouth daily  Neuropathy  - continue Neurontin 100 mg 1 capsule by mouth 3 times a day   Hypothyroidism - TSH 3.971; continue levothyroxine 100 g 1 tablet by mouth daily  Protein calorie malnutrition, severe - albumen 2.7; referred to RD; continue supplementation  Depression - mood is stable; continue Prozac 40 mg 1 capsule by mouth daily    Goals of care:  Short-term rehabilitation   Labs/test ordered:   BMP and CBC  Spent 50 minutes in patient care.   [facility], NP [employer] [phone]

## 2015-06-14 ENCOUNTER — Non-Acute Institutional Stay (SKILLED_NURSING_FACILITY): Payer: Medicare Other | Admitting: Internal Medicine

## 2015-06-14 DIAGNOSIS — K592 Neurogenic bowel, not elsewhere classified: Secondary | ICD-10-CM

## 2015-06-14 DIAGNOSIS — S3210XS Unspecified fracture of sacrum, sequela: Secondary | ICD-10-CM | POA: Diagnosis not present

## 2015-06-14 DIAGNOSIS — E039 Hypothyroidism, unspecified: Secondary | ICD-10-CM | POA: Diagnosis not present

## 2015-06-14 DIAGNOSIS — E46 Unspecified protein-calorie malnutrition: Secondary | ICD-10-CM

## 2015-06-14 DIAGNOSIS — F329 Major depressive disorder, single episode, unspecified: Secondary | ICD-10-CM

## 2015-06-14 DIAGNOSIS — N319 Neuromuscular dysfunction of bladder, unspecified: Secondary | ICD-10-CM | POA: Diagnosis not present

## 2015-06-14 DIAGNOSIS — M792 Neuralgia and neuritis, unspecified: Secondary | ICD-10-CM | POA: Diagnosis not present

## 2015-06-14 DIAGNOSIS — F322 Major depressive disorder, single episode, severe without psychotic features: Secondary | ICD-10-CM

## 2015-06-14 DIAGNOSIS — R5381 Other malaise: Secondary | ICD-10-CM | POA: Diagnosis not present

## 2015-06-14 NOTE — Progress Notes (Signed)
Patient ID: Jamie [name], female   DOB: 06-Jan-1956, 59 y.o.   MRN: [id number]     [facility]   PCP: No primary care provider on file.  Code Status: full code  Allergies  Allergen Reactions  . Remicade [Infliximab]     shingles  . Penicillins Rash    Chief Complaint  Patient presents with  . New Admit To SNF     HPI:  58 year old patient is here for short term rehabilitation post hospital admission from 05/24/15-06/10/15 with sacral fracture post fall and T10 compression, e.coli UTI and urinary and bowel incontinence. MRI of lumbar spine showed sacral fractures S1-S2 with mild angulation and some edema and hemorrhage in the area. Thoracic spine MRI showed a small T10 superior endplate compression. No surgical intervention per neurosurgery and TLSO for support was recommended. She has history of neurosarcoidosis with peripheral neuropathy, visual impairment. She is seen in her room today. She denies any concerns. She has been working with therapy treatment.   Review of Systems:  Constitutional: Negative for fever, chills, diaphoresis.  HENT: Negative for headache, congestion, nasal discharge Eyes: legally blind Respiratory: Negative for cough, shortness of breath and wheezing.   Cardiovascular: Negative for chest pain, palpitations, leg swelling.  Gastrointestinal: Negative for heartburn, nausea, vomiting, abdominal pain Genitourinary: Negative for dysuria, flank pain.  Musculoskeletal: Negative for falls in facility Skin: Negative for itching, rash.  Neurological: Negative for dizziness, tingling, focal weakness Psychiatric/Behavioral: Negative for depression.    Past Medical History  Diagnosis Date  . 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 Neurosarcoidosis in adult     followed at [facility]  . 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 Progressive gait disorder     Was treated for probable MS until diagnosed with Neurosarcoidosis  about 10 years ago.    No past surgical history on file. Social History:   reports that she has never smoked. She does not have any smokeless tobacco history on file. She reports that she does not drink alcohol or use illicit drugs.  Family History  Problem Relation Age of Onset  . Hypertension Father     Medications: Patient's Medications  New Prescriptions   No medications on file  Previous Medications   ACETAMINOPHEN (TYLENOL) 325 MG TABLET    Take 1-2 tablets (325-650 mg total) by mouth every 4 (four) hours as needed for mild pain.   BISACODYL (DULCOLAX) 10 MG SUPPOSITORY    Place 1 suppository (10 mg total) rectally daily at 6 (six) AM.   FLUOXETINE (PROZAC) 40 MG CAPSULE    Take 40 mg by mouth daily.   GABAPENTIN (NEURONTIN) 100 MG CAPSULE    Take 1 capsule (100 mg total) by mouth 3 (three) times daily.   LEVOTHYROXINE (SYNTHROID, LEVOTHROID) 100 MCG TABLET    Take 100 mcg by mouth daily.   LIDOCAINE (LIDODERM) 5 %    Place 1 patch onto the skin daily. Place on sacrum at 6 pm and remove at 6 am daily   METHOCARBAMOL (ROBAXIN) 500 MG TABLET    Take 1 tablet (500 mg total) by mouth every 6 (six) hours as needed for muscle spasms.   MULTIPLE VITAMIN (MULTIVITAMIN WITH MINERALS) TABS TABLET    Take 1 tablet by mouth daily.   OXYCODONE 10 MG TABS    Take 1 tablet (10 mg total) by mouth every 6 (six) hours as needed for moderate pain.   POLYCARBOPHIL (FIBERCON) 625  MG TABLET    Take 2 tablets (1,250 mg total) by mouth daily.  Modified Medications   No medications on file  Discontinued Medications   No medications on file     Physical Exam: Filed Vitals:   06/14/15 1704  BP: 121/76  Pulse: 88  Temp: 97.9 F (36.6 C)  Resp: 18  Height: 5\' 4"  (1.626 m)  Weight: 136 lb 9.6 oz (61.961 kg)  SpO2: 98%    General- elderly female, well built, in no acute distress Head- normocephalic, atraumatic Throat- moist mucus membrane Neck- no cervical lymphadenopathy Cardiovascular-  normal s1,s2, no murmurs, palpable dorsalis pedis, trace leg edema Respiratory- bilateral clear to auscultation, no wheeze, no rhonchi, no crackles, no use of accessory muscles Abdomen- bowel sounds present, soft, non tender Musculoskeletal- able to move all 4 extremities, generalized weakness more in lower extremities Neurological- no focal deficit Skin- warm and dry Psychiatry- alert and oriented to person, place and time, normal mood and affect    Labs reviewed: Basic Metabolic Panel:  Recent Labs  05/20/15 1807 05/21/15 0415 05/25/15 0757 05/31/15 0700 06/07/15 0705  NA 141 141 140  --   --   K 3.8 3.6 4.0  --   --   CL 105 107 104  --   --   CO2 28 25 26   --   --   GLUCOSE 87 138* 83  --   --   BUN 20 17 13   --   --   CREATININE 0.55 0.48 0.51 0.48 0.49  CALCIUM 9.2 8.8* 8.9  --   --   MG  --  1.9  --   --   --   PHOS  --  2.9  --   --   --    Liver Function Tests:  Recent Labs  05/20/15 1807 05/21/15 0415 05/25/15 0757  AST 25 22 21   ALT 28 22 15   ALKPHOS 144* 134* 218*  BILITOT 0.3 0.2* 0.3  PROT 6.8 6.0* 6.0*  ALBUMIN 3.4* 2.9* 2.7*   No results for input(s): LIPASE, AMYLASE in the last 8760 hours. No results for input(s): AMMONIA in the last 8760 hours. CBC:  Recent Labs  05/12/15 1030 05/20/15 1807 05/21/15 0415 05/25/15 0757  WBC 8.6 7.4 7.8 6.4  NEUTROABS 6.3 4.3  --  3.8  HGB 10.6* 10.8* 9.9* 10.6*  HCT 30.1* 31.4* 29.0* 30.7*  MCV 80.9 83.1 82.6 82.3  PLT 180 360 342 337   Radiological Exams:  Dg Chest 2 View  06/09/2015   CLINICAL DATA:  Routine checkup.  EXAM: CHEST  2 VIEW  COMPARISON:  None.  FINDINGS: The heart size and mediastinal contours are within normal limits. No pneumothorax or pleural effusion is noted. Minimal subsegmental atelectasis or scarring is noted laterally in right lung base. Mild subsegmental atelectasis or scarring is noted posteriorly in the left lower lobe. The visualized skeletal structures are unremarkable.   IMPRESSION: Bibasilar subsegmental atelectasis or scarring is noted, with left greater than right.   Electronically Signed   By: Marijo Conception, M.D.   On: 06/09/2015 15:51   Ct Head Wo Contrast  05/20/2015   CLINICAL DATA:  Several falls during the past 2 weeks. No loss of consciousness. Generalized weakness.  EXAM: CT HEAD WITHOUT CONTRAST  TECHNIQUE: Contiguous axial images were obtained from the base of the skull through the vertex without intravenous contrast.  COMPARISON:  05/12/2015  FINDINGS: Similar findings of age advanced centralized atrophy with ex vacuo  dilatation of the ventricular system, most conspicuously involving the fourth ventricle. Age advanced cerebellar volume loss with sulcal prominence. Scattered periventricular hypodensities compatible microvascular ischemic disease. Given background parenchymal abnormalities, there is no CT evidence of superimposed acute large territory infarct. No definite intraparenchymal or extra-axial mass or hemorrhage. Unchanged size and configuration of the ventricles and basilar cisterns. No midline shift. There is underpneumatization the bilateral frontal sinuses. Polypoid mucosal thickening of the right maxillary sinus. The remaining paranasal sinuses and mastoid air cells are normally aerated. No air-fluid levels. Regional soft tissues appear normal. No displaced calvarial fracture.  IMPRESSION: 1. Microvascular ischemic disease without definite acute intracranial process. 2. Similar findings of age advanced cerebellar and centralized atrophy with commensurate ex vacuo dilatation of the ventricular system most conspicuously involving the fourth ventricle. Further evaluation could be performed with brain MRI as clinically indicated.   Electronically Signed   By: Sandi Mariscal M.D.   On: 05/20/2015 16:53   Mr Cervical Spine Wo Contrast  05/20/2015   CLINICAL DATA:  59 year old female with weakness and falling for the past 14 days. History of neurosarcoidosis.  Numbness buttock region. Symptoms worsening over the past 3 days. Subsequent encounter.  EXAM: MRI CERVICAL SPINE WITHOUT CONTRAST  TECHNIQUE: Multiplanar, multisequence MR imaging of the cervical spine was performed. No intravenous contrast was administered.  COMPARISON:  05/20/2015 and 05/12/2015 head CT.  FINDINGS: Exam is motion degraded.  Abnormal appearance of the cerebellum with marked atrophy. Possibility of primary neurodegenerative disorder of the cerebellum is raised. Similar type findings can be seen in this setting of seizures, treatment of seizures, malnutrition or excess alcohol use. On CT, patient is also noted to have prominent lateral ventricles. Patient may benefit from contrast-enhanced MR for further delineation (would help evaluate for intracranial neurosarcoidosis changes).  Curvature of the cervical spine. No obvious osseous edema or soft tissue edema to suggest bony injury or soft tissue injury. If bony injury is of high clinical concern, CT may then be considered for further delineation.  No focal cord signal abnormality.  Evaluation is otherwise limited secondary to the motion.  C2-3:  Negative.  C3-4:  Negative.  C4-5:  Bulge with mild spinal stenosis and minimal cord contact.  C5-6: Minimal to mild bulge. Minimal narrowing ventral aspect of the thecal sac.  C6-7:  Minimal bulge.  C7-T1:  Negative.  T1-2:  Negative.  T2-3:  Negative.  T3-4: Small Schmorl's node deformity superior endplate T4.  N0-2:  Limited imaging without obvious abnormality.  IMPRESSION: Exam is motion degraded.  Abnormal appearance of the cerebellum with marked atrophy. Possibility of primary neurodegenerative disorder of the cerebellum is raised. Similar type findings can be seen in this setting of seizures, treatment of seizures, malnutrition or excess alcohol use. On CT, patient is also noted to have prominent lateral ventricles. Patient may benefit from contrast-enhanced MR for further delineation (would help  evaluate for intracranial neurosarcoidosis changes).  Curvature of the cervical spine. No obvious osseous edema or soft tissue edema to suggest bony injury or soft tissue injury. If bony injury is of high clinical concern, CT may then be considered for further delineation.  No focal cord signal abnormality.  Evaluation is otherwise limited secondary to the motion.  C4-5 bulge with mild spinal stenosis and minimal cord contact.   Electronically Signed   By: Genia Del M.D.   On: 05/20/2015 21:33   Mr Lumbar Spine Wo Contrast  05/20/2015   CLINICAL DATA:  59 year old female with history of neuro  sarcoidosis. Recent falls. Numbness buttock region. Subsequent encounter.  EXAM: MRI LUMBAR SPINE WITHOUT CONTRAST  TECHNIQUE: Multiplanar, multisequence MR imaging of the lumbar spine was performed. No intravenous contrast was administered.  COMPARISON:  05/12/2015 plain film exam.  FINDINGS: Last fully open disk space is labeled L5-S1. Present examination incorporates from T11-12 disc space through lower sacrum.  Exam is motion degraded.  Conus L1-2 level.  Fracture of the sacrum. This is centered at the S1-2 level with mild retropulsion and angulation at this level contributing to narrowing of the canal and contained nerve roots. Surrounding edema/hemorrhage.  T11-12 through L3-4 unremarkable.  L4-5: Mild disc degeneration. Bulge with small central disc protrusion. Minimal caudal extension. Slight impression ventral aspect of the thecal sac. Mild facet joint degenerative changes.  L5-S1:  Negative.  The bladder is full which may be related to patient not urinating as no compression of the conus is noted although other causes for urinary retention are not excluded.  Multiple gallstones.  IMPRESSION: Exam is motion degraded.  Fracture of the sacrum. This is centered at the S1-2 level with mild retropulsion and angulation at this level contributing to narrowing of the canal and contained nerve roots. Surrounding  edema/hemorrhage.  L4-5 mild disc degeneration. Bulge with small central disc protrusion. Minimal caudal extension. Slight impression ventral aspect of the thecal sac. Mild facet joint degenerative changes.  The bladder is full which may be related to patient not urinating as no compression of the conus is noted although other causes for urinary retention are not excluded.  Multiple gallstones.   Electronically Signed   By: Genia Del M.D.   On: 05/20/2015 22:03   Mr Thoracic Spine W Wo Contrast  05/21/2015   CLINICAL DATA:  [place].  Back pain.  EXAM: MRI THORACIC SPINE WITHOUT AND WITH CONTRAST  TECHNIQUE: Multiplanar and multiecho pulse sequences of the thoracic spine were obtained without and with intravenous contrast.  CONTRAST:  40mL MULTIHANCE GADOBENATE DIMEGLUMINE 529 MG/ML IV SOLN  COMPARISON:  None.  FINDINGS: Normal alignment of the thoracic vertebral bodies. There is a mild superior endplate fracture of the T10 vertebral body. No retropulsion or canal compromise. The other thoracic vertebral bodies are normal. The thoracic spinal cord is normal. No cord lesions or syrinx. The facets are normally aligned. No foraminal lesions. No thoracic disc protrusions or canal stenosis.  IMPRESSION: Mild superior endplate fracture of I94 without retropulsion or canal compromise.   Electronically Signed   By: Marijo Sanes M.D.   On: 05/21/2015 13:49     Assessment/Plan  Physical deconditioning Will have patient work with PT/OT as tolerated to regain strength and restore function.  Fall precautions are in place.  Sacral fracture  No surgical intervention done. continue TLSO when necessary for support with lidocaine 5% patch transdermally to sacrum daily and oxycodone 10 mg every 6 hours as necessary for pain.  Neurogenic bladder toilet patient every 4 hours while awake and continue perineal skin care  Neurogenic bowel Dulcolax suppository daily at 6 AM with digital stimulation as bowel program to  help prevent incontinence and continue FiberCon supplement  Protein calorie malnutrition Continue procel, monitor weight and po intake  Neuropathic pain continue Neurontin 100 mg tid, stable  Hypothyroidism TSH 3.971. continue levothyroxine 100 mcg daily  Chronic depression Mood stable, continue Prozac 40 mg daily   Goals of care: short term rehabilitation   Labs/tests ordered: cbc, bmp  Family/ staff Communication: reviewed care plan with patient and nursing supervisor  Blanchie Serve, MD  [facility] [phone] (Monday-Friday 8 am - 5 pm) [phone] (afterhours)

## 2015-06-20 ENCOUNTER — Other Ambulatory Visit
Admission: RE | Admit: 2015-06-20 | Discharge: 2015-06-20 | Disposition: A | Discharge status: Home / Self Care | Payer: Medicare Other | Source: Other Acute Inpatient Hospital | Attending: Internal Medicine | Admitting: Internal Medicine

## 2015-06-20 DIAGNOSIS — R339 Retention of urine, unspecified: Secondary | ICD-10-CM | POA: Insufficient documentation

## 2015-06-20 DIAGNOSIS — R52 Pain, unspecified: Secondary | ICD-10-CM | POA: Insufficient documentation

## 2015-06-20 DIAGNOSIS — R279 Unspecified lack of coordination: Secondary | ICD-10-CM | POA: Insufficient documentation

## 2015-06-20 LAB — URINALYSIS COMPLETE WITH MICROSCOPIC (ARMC ONLY)
BILIRUBIN URINE: NEGATIVE
Glucose, UA: NEGATIVE mg/dL
Hgb urine dipstick: NEGATIVE
Ketones, ur: NEGATIVE mg/dL
NITRITE: POSITIVE — AB
Protein, ur: NEGATIVE mg/dL
SPECIFIC GRAVITY, URINE: 1.018 (ref 1.005–1.030)
SQUAMOUS EPITHELIAL / LPF: NONE SEEN
pH: 8 (ref 5.0–8.0)

## 2015-06-21 LAB — URINE CULTURE

## 2015-06-22 ENCOUNTER — Non-Acute Institutional Stay (SKILLED_NURSING_FACILITY): Payer: Medicare Other | Admitting: Internal Medicine

## 2015-06-22 DIAGNOSIS — R159 Full incontinence of feces: Secondary | ICD-10-CM | POA: Diagnosis not present

## 2015-06-22 DIAGNOSIS — S3210XS Unspecified fracture of sacrum, sequela: Secondary | ICD-10-CM | POA: Diagnosis not present

## 2015-06-22 DIAGNOSIS — R32 Unspecified urinary incontinence: Secondary | ICD-10-CM

## 2015-06-22 DIAGNOSIS — M792 Neuralgia and neuritis, unspecified: Secondary | ICD-10-CM

## 2015-06-22 DIAGNOSIS — R410 Disorientation, unspecified: Secondary | ICD-10-CM | POA: Diagnosis not present

## 2015-06-22 NOTE — Progress Notes (Addendum)
Patient ID: Patsie Mccardle, female   DOB: Jan 08, 1956, 59 y.o.   MRN: [id number]     [facility]   PCP: No primary care provider on file.  Code Status: full code  Allergies  Allergen Reactions  . Remicade [Infliximab]     shingles  . Penicillins Rash    Chief Complaint  Patient presents with  . Acute Visit    concern from family     HPI:  59 year old patient is here for short term rehabilitation post sacral fracture and T10 compression, e.coli UTI and urinary and bowel incontinence. She is under conservative management and has TLSO for support. Her family have concerns about her being confused over the weekend and forgetting things. She forgot that her parents were here to meet her. She has history of neurosarcoidosis with peripheral neuropathy, visual impairment. She is seen in her room today. She denies any concerns. She is alert and oriented at present. She has been working with therapy team.  Review of Systems:  Constitutional: Negative for fever, chills, diaphoresis.  HENT: Negative for headache, congestion, nasal discharge Eyes: legally blind Respiratory: Negative for cough, shortness of breath and wheezing.   Cardiovascular: Negative for chest pain, palpitations, leg swelling.  Gastrointestinal: Negative for heartburn, nausea, vomiting, abdominal pain Genitourinary: Negative for dysuria, flank pain.  Musculoskeletal: Negative for falls in facility Skin: Negative for itching, rash.  Neurological: Negative for dizziness, tingling, focal weakness Psychiatric/Behavioral: Negative for depression.    Past Medical History  Diagnosis Date  . 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 Neurosarcoidosis in adult     followed at [facility]  . 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 Progressive gait disorder     Was treated for probable MS until diagnosed with Neurosarcoidosis about 10 years ago.    No past surgical history on  file. Social History:   reports that she has never smoked. She does not have any smokeless tobacco history on file. She reports that she does not drink alcohol or use illicit drugs.  Family History  Problem Relation Age of Onset  . Hypertension Father     Medications: Patient's Medications  New Prescriptions   No medications on file  Previous Medications   ACETAMINOPHEN (TYLENOL) 325 MG TABLET    Take 1-2 tablets (325-650 mg total) by mouth every 4 (four) hours as needed for mild pain.   BISACODYL (DULCOLAX) 10 MG SUPPOSITORY    Place 1 suppository (10 mg total) rectally daily at 6 (six) AM.   FLUOXETINE (PROZAC) 40 MG CAPSULE    Take 40 mg by mouth daily.   GABAPENTIN (NEURONTIN) 100 MG CAPSULE    Take 1 capsule (100 mg total) by mouth 3 (three) times daily.   LEVOTHYROXINE (SYNTHROID, LEVOTHROID) 100 MCG TABLET    Take 100 mcg by mouth daily.   LIDOCAINE (LIDODERM) 5 %    Place 1 patch onto the skin daily. Place on sacrum at 6 pm and remove at 6 am daily   METHOCARBAMOL (ROBAXIN) 500 MG TABLET    Take 1 tablet (500 mg total) by mouth every 6 (six) hours as needed for muscle spasms.   MULTIPLE VITAMIN (MULTIVITAMIN WITH MINERALS) TABS TABLET    Take 1 tablet by mouth daily.   OXYCODONE 10 MG TABS    Take 1 tablet (10 mg total) by mouth every 6 (six) hours as needed for moderate pain.   POLYCARBOPHIL (FIBERCON) 625 MG TABLET  Take 2 tablets (1,250 mg total) by mouth daily.  Modified Medications   No medications on file  Discontinued Medications   No medications on file     Physical Exam: Filed Vitals:   06/22/15 1616  BP: 98/54  Pulse: 78  Temp: 98.7 F (37.1 C)  Resp: 18  Height: 5\' 4"  (1.626 m)  Weight: 137 lb 6.4 oz (62.324 kg)  SpO2: 97%    General- adult female, well built, in no acute distress Head- normocephalic, atraumatic Throat- moist mucus membrane Neck- no cervical lymphadenopathy Cardiovascular- normal s1,s2, no murmurs, palpable dorsalis pedis, trace  leg edema Respiratory- bilateral clear to auscultation, no wheeze, no rhonchi, no crackles, no use of accessory muscles Abdomen- bowel sounds present, soft, non tender Musculoskeletal- able to move all 4 extremities, generalized weakness more in lower extremities Neurological- no focal deficit Skin- warm and dry Psychiatry- alert and oriented to person, place and time, normal mood and affect    Labs reviewed: Basic Metabolic Panel:  Recent Labs  05/20/15 1807 05/21/15 0415 05/25/15 0757 05/31/15 0700 06/07/15 0705  NA 141 141 140  --   --   K 3.8 3.6 4.0  --   --   CL 105 107 104  --   --   CO2 28 25 26   --   --   GLUCOSE 87 138* 83  --   --   BUN 20 17 13   --   --   CREATININE 0.55 0.48 0.51 0.48 0.49  CALCIUM 9.2 8.8* 8.9  --   --   MG  --  1.9  --   --   --   PHOS  --  2.9  --   --   --    Liver Function Tests:  Recent Labs  05/20/15 1807 05/21/15 0415 05/25/15 0757  AST 25 22 21   ALT 28 22 15   ALKPHOS 144* 134* 218*  BILITOT 0.3 0.2* 0.3  PROT 6.8 6.0* 6.0*  ALBUMIN 3.4* 2.9* 2.7*   No results for input(s): LIPASE, AMYLASE in the last 8760 hours. No results for input(s): AMMONIA in the last 8760 hours. CBC:  Recent Labs  05/12/15 1030 05/20/15 1807 05/21/15 0415 05/25/15 0757  WBC 8.6 7.4 7.8 6.4  NEUTROABS 6.3 4.3  --  3.8  HGB 10.6* 10.8* 9.9* 10.6*  HCT 30.1* 31.4* 29.0* 30.7*  MCV 80.9 83.1 82.6 82.3  PLT 180 360 342 337   Radiological Exams:  Dg Chest 2 View  06/09/2015   CLINICAL DATA:  Routine checkup.  EXAM: CHEST  2 VIEW  COMPARISON:  None.  FINDINGS: The heart size and mediastinal contours are within normal limits. No pneumothorax or pleural effusion is noted. Minimal subsegmental atelectasis or scarring is noted laterally in right lung base. Mild subsegmental atelectasis or scarring is noted posteriorly in the left lower lobe. The visualized skeletal structures are unremarkable.  IMPRESSION: Bibasilar subsegmental atelectasis or scarring  is noted, with left greater than right.   Electronically Signed   By: Marijo Conception, M.D.   On: 06/09/2015 15:51   Ct Head Wo Contrast  05/20/2015   CLINICAL DATA:  Several falls during the past 2 weeks. No loss of consciousness. Generalized weakness.  EXAM: CT HEAD WITHOUT CONTRAST  TECHNIQUE: Contiguous axial images were obtained from the base of the skull through the vertex without intravenous contrast.  COMPARISON:  05/12/2015  FINDINGS: Similar findings of age advanced centralized atrophy with ex vacuo dilatation of the ventricular system,  most conspicuously involving the fourth ventricle. Age advanced cerebellar volume loss with sulcal prominence. Scattered periventricular hypodensities compatible microvascular ischemic disease. Given background parenchymal abnormalities, there is no CT evidence of superimposed acute large territory infarct. No definite intraparenchymal or extra-axial mass or hemorrhage. Unchanged size and configuration of the ventricles and basilar cisterns. No midline shift. There is underpneumatization the bilateral frontal sinuses. Polypoid mucosal thickening of the right maxillary sinus. The remaining paranasal sinuses and mastoid air cells are normally aerated. No air-fluid levels. Regional soft tissues appear normal. No displaced calvarial fracture.  IMPRESSION: 1. Microvascular ischemic disease without definite acute intracranial process. 2. Similar findings of age advanced cerebellar and centralized atrophy with commensurate ex vacuo dilatation of the ventricular system most conspicuously involving the fourth ventricle. Further evaluation could be performed with brain MRI as clinically indicated.   Electronically Signed   By: Sandi Mariscal M.D.   On: 05/20/2015 16:53   Mr Cervical Spine Wo Contrast  05/20/2015   CLINICAL DATA:  59 year old female with weakness and falling for the past 14 days. History of neurosarcoidosis. Numbness buttock region. Symptoms worsening over the past 3  days. Subsequent encounter.  EXAM: MRI CERVICAL SPINE WITHOUT CONTRAST  TECHNIQUE: Multiplanar, multisequence MR imaging of the cervical spine was performed. No intravenous contrast was administered.  COMPARISON:  05/20/2015 and 05/12/2015 head CT.  FINDINGS: Exam is motion degraded.  Abnormal appearance of the cerebellum with marked atrophy. Possibility of primary neurodegenerative disorder of the cerebellum is raised. Similar type findings can be seen in this setting of seizures, treatment of seizures, malnutrition or excess alcohol use. On CT, patient is also noted to have prominent lateral ventricles. Patient may benefit from contrast-enhanced MR for further delineation (would help evaluate for intracranial neurosarcoidosis changes).  Curvature of the cervical spine. No obvious osseous edema or soft tissue edema to suggest bony injury or soft tissue injury. If bony injury is of high clinical concern, CT may then be considered for further delineation.  No focal cord signal abnormality.  Evaluation is otherwise limited secondary to the motion.  C2-3:  Negative.  C3-4:  Negative.  C4-5:  Bulge with mild spinal stenosis and minimal cord contact.  C5-6: Minimal to mild bulge. Minimal narrowing ventral aspect of the thecal sac.  C6-7:  Minimal bulge.  C7-T1:  Negative.  T1-2:  Negative.  T2-3:  Negative.  T3-4: Small Schmorl's node deformity superior endplate T4.  K7-4:  Limited imaging without obvious abnormality.  IMPRESSION: Exam is motion degraded.  Abnormal appearance of the cerebellum with marked atrophy. Possibility of primary neurodegenerative disorder of the cerebellum is raised. Similar type findings can be seen in this setting of seizures, treatment of seizures, malnutrition or excess alcohol use. On CT, patient is also noted to have prominent lateral ventricles. Patient may benefit from contrast-enhanced MR for further delineation (would help evaluate for intracranial neurosarcoidosis changes).  Curvature  of the cervical spine. No obvious osseous edema or soft tissue edema to suggest bony injury or soft tissue injury. If bony injury is of high clinical concern, CT may then be considered for further delineation.  No focal cord signal abnormality.  Evaluation is otherwise limited secondary to the motion.  C4-5 bulge with mild spinal stenosis and minimal cord contact.   Electronically Signed   By: Genia Del M.D.   On: 05/20/2015 21:33   Mr Lumbar Spine Wo Contrast  05/20/2015   CLINICAL DATA:  59 year old female with history of neuro sarcoidosis. Recent falls. Numbness buttock  region. Subsequent encounter.  EXAM: MRI LUMBAR SPINE WITHOUT CONTRAST  TECHNIQUE: Multiplanar, multisequence MR imaging of the lumbar spine was performed. No intravenous contrast was administered.  COMPARISON:  05/12/2015 plain film exam.  FINDINGS: Last fully open disk space is labeled L5-S1. Present examination incorporates from T11-12 disc space through lower sacrum.  Exam is motion degraded.  Conus L1-2 level.  Fracture of the sacrum. This is centered at the S1-2 level with mild retropulsion and angulation at this level contributing to narrowing of the canal and contained nerve roots. Surrounding edema/hemorrhage.  T11-12 through L3-4 unremarkable.  L4-5: Mild disc degeneration. Bulge with small central disc protrusion. Minimal caudal extension. Slight impression ventral aspect of the thecal sac. Mild facet joint degenerative changes.  L5-S1:  Negative.  The bladder is full which may be related to patient not urinating as no compression of the conus is noted although other causes for urinary retention are not excluded.  Multiple gallstones.  IMPRESSION: Exam is motion degraded.  Fracture of the sacrum. This is centered at the S1-2 level with mild retropulsion and angulation at this level contributing to narrowing of the canal and contained nerve roots. Surrounding edema/hemorrhage.  L4-5 mild disc degeneration. Bulge with small central  disc protrusion. Minimal caudal extension. Slight impression ventral aspect of the thecal sac. Mild facet joint degenerative changes.  The bladder is full which may be related to patient not urinating as no compression of the conus is noted although other causes for urinary retention are not excluded.  Multiple gallstones.   Electronically Signed   By: Genia Del M.D.   On: 05/20/2015 22:03   Mr Thoracic Spine W Wo Contrast  05/21/2015   CLINICAL DATA:  [place].  Back pain.  EXAM: MRI THORACIC SPINE WITHOUT AND WITH CONTRAST  TECHNIQUE: Multiplanar and multiecho pulse sequences of the thoracic spine were obtained without and with intravenous contrast.  CONTRAST:  89mL MULTIHANCE GADOBENATE DIMEGLUMINE 529 MG/ML IV SOLN  COMPARISON:  None.  FINDINGS: Normal alignment of the thoracic vertebral bodies. There is a mild superior endplate fracture of the T10 vertebral body. No retropulsion or canal compromise. The other thoracic vertebral bodies are normal. The thoracic spinal cord is normal. No cord lesions or syrinx. The facets are normally aligned. No foraminal lesions. No thoracic disc protrusions or canal stenosis.  IMPRESSION: Mild superior endplate fracture of J69 without retropulsion or canal compromise.   Electronically Signed   By: Marijo Sanes M.D.   On: 05/21/2015 13:49     Assessment/Plan  Intermittent confusion Alert and oriented at present. Reviewed ct head from hospital showing brain atrophy and ischemic changes. Possible vascular component. Check BIMS score. Check cbc with diff and cmp along with tsh and b12 to rule out reversible etiology and metabolic impairment. On neurontin which could contribute some along with pain medication. neurontin reduced to 100 mg bid for now. If these results are normal, refer to neurology.  Sacral fracture  Pain controlled. Taking oxycodone only in am and is on lidocaine patch.  continue TLSO when necessary for support. monitor  Neurogenic bladder and  bowel Post sacral fracture and T10 compression. Will need bladder and bowel training. Continue skin care. Pending neurosurgery follow up  Neuropathic pain continue Neurontin but reduce it to 100 mg bid for now and reassess   Goals of care: short term rehabilitation   Labs/tests ordered: cbc with diff, cmp, tsh, b12, BIMS score  Family/ staff Communication: reviewed care plan with patient, her sister Truman Hayward  [place] over the phone and nursing staff.    Blanchie Serve, MD  [facility] [phone] (Monday-Friday 8 am - 5 pm) [phone] (afterhours)   With low bp, pt denies any symptoms. Check orthostatics x 3 and cortisol level in am

## 2015-06-23 ENCOUNTER — Ambulatory Visit: Payer: Medicare Other | Admitting: Internal Medicine

## 2015-06-29 ENCOUNTER — Non-Acute Institutional Stay (SKILLED_NURSING_FACILITY): Payer: Medicare Other | Admitting: Adult Health

## 2015-06-29 ENCOUNTER — Encounter: Payer: Self-pay | Admitting: Adult Health

## 2015-06-29 DIAGNOSIS — R5381 Other malaise: Secondary | ICD-10-CM

## 2015-06-29 DIAGNOSIS — G629 Polyneuropathy, unspecified: Secondary | ICD-10-CM | POA: Diagnosis not present

## 2015-06-29 DIAGNOSIS — K592 Neurogenic bowel, not elsewhere classified: Secondary | ICD-10-CM | POA: Diagnosis not present

## 2015-06-29 DIAGNOSIS — F329 Major depressive disorder, single episode, unspecified: Secondary | ICD-10-CM | POA: Diagnosis not present

## 2015-06-29 DIAGNOSIS — N319 Neuromuscular dysfunction of bladder, unspecified: Secondary | ICD-10-CM

## 2015-06-29 DIAGNOSIS — S3210XS Unspecified fracture of sacrum, sequela: Secondary | ICD-10-CM

## 2015-06-29 DIAGNOSIS — E46 Unspecified protein-calorie malnutrition: Secondary | ICD-10-CM | POA: Diagnosis not present

## 2015-06-29 DIAGNOSIS — F32A Depression, unspecified: Secondary | ICD-10-CM

## 2015-06-29 DIAGNOSIS — E039 Hypothyroidism, unspecified: Secondary | ICD-10-CM | POA: Diagnosis not present

## 2015-06-29 NOTE — Progress Notes (Signed)
Patient ID: Jamie [name], female   DOB: 25-Dec-1955, 59 y.o.   MRN: [id number]   06/29/2015  Facility:  Nursing Home Location:  [place] Room Number: 157-W LEVEL OF CARE:  SNF (31)   Chief Complaint  Patient presents with  . Discharge Note    Physical deconditioning, sacral fracture, neurogenic bladder, neurogenic bowel, neuropathy, hypothyroidism, protein calorie malnutrition and depression    HISTORY OF PRESENT ILLNESS:  This is a 59 year old female who is for discharge home with Home health PT, OT, SW and CNA. DME:  Semi-electric hospital bed, high back wheelchair, basic cushion, legrests and anti-tippers. She has been admitted to [facility] on 06/10/15 from [facility]. She had a fall on 5/23 with onset of sacral pain, bowel/bladder incontinence, difficulty walking with recurrent falls and inability to care for self. ED evaluation on 6/3 showed MRI of lumbar showing sacral fractures S1-S2 with mild angulation and some edema and hemorrhage in the area. Thoracic spine MRI shows a small T10 superior endplate compression. No surgical intervention per neurosurgery and orthopedic consult recommended TLSO for support. She had urinary retention and had Foley catheter placement. She was treated for Escherichia coli UTI with Rocephin. She had issues with neuropathy on bilateral lower extremity. She had inpatient rehabilitation. Foley catheter was discontinued on 6/20 and started to void without retention. She is now incontinent of urine due to spastic bladder.  She was noted to be forgetful and family/sister was concerned that it might be due to Neurontin. Neurontin dosage was decreased to 100 mg BID instead of TID. Today, patient verbalized that she is starting to have some sensations on her legs.  Patient was admitted to this facility for short-term rehabilitation after the patient's recent hospitalization.  Patient has completed SNF rehabilitation and therapy has  cleared the patient for discharge.  PAST MEDICAL HISTORY:  Past Medical History  Diagnosis Date  . 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 Neurosarcoidosis in adult     followed at [facility]  . 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 Progressive gait disorder     Was treated for probable MS until diagnosed with Neurosarcoidosis about 10 years ago.     CURRENT MEDICATIONS: Reviewed per MAR/see medication list  Allergies  Allergen Reactions  . Remicade [Infliximab]     shingles  . Penicillins Rash     REVIEW OF SYSTEMS:  GENERAL: no change in appetite, no fatigue, no weight changes, no fever, chills or weakness RESPIRATORY: no cough, SOB, DOE, wheezing, hemoptysis CARDIAC: no chest pain, or palpitations GI: no abdominal pain, heart burn, nausea or vomiting GU: + Urinary incontinence NEURO:  + Numbness on bilateral lower extremity (sole of feet)  PHYSICAL EXAMINATION  GENERAL: no acute distress, normal body habitus NECK: supple, trachea midline, no neck masses, no thyroid tenderness, no thyromegaly LYMPHATICS: no LAN in the neck, no supraclavicular LAN RESPIRATORY: breathing is even & unlabored, BS CTAB CARDIAC: RRR, no murmur,no extra heart sounds, BLE edema 1+ GI: abdomen soft, normal BS, no masses, no tenderness, no hepatomegaly, no splenomegaly EXTREMITIES:  Able to move 4 extremities; has TLSO PSYCHIATRIC: the patient is alert & oriented to person, affect & behavior appropriate  LABS/RADIOLOGY: Labs reviewed: 06/23/15  WBC 5.5 hemoglobin 10.4 hematocrit 30.8 MCV 85.1 platelet 232 sodium 144 potassium 4.3 glucose 73 BUN 22 creatinine 0.49 total bilirubin 0.2 alkaline phosphatase 93 SGOT 19 SGPT 17 total protein 5.5 albumin 3.1 calcium 8.8 TSH 1.0  68 vitamin B12 1515 cortisol 15.1 vitamin D 49 06/14/15  WBC 5.5 hemoglobin 10.9 hematocrit 32.2 MCV 85.0 platelet 179 sodium 142 potassium 4.4 glucose 72 BUN 16 creatinine 0.54 calcium 8.7 Basic Metabolic  Panel:  Recent Labs  05/20/15 1807 05/21/15 0415 05/25/15 0757 05/31/15 0700 06/07/15 0705  NA 141 141 140  --   --   K 3.8 3.6 4.0  --   --   CL 105 107 104  --   --   CO2 28 25 26   --   --   GLUCOSE 87 138* 83  --   --   BUN 20 17 13   --   --   CREATININE 0.55 0.48 0.51 0.48 0.49  CALCIUM 9.2 8.8* 8.9  --   --   MG  --  1.9  --   --   --   PHOS  --  2.9  --   --   --    Liver Function Tests:  Recent Labs  05/20/15 1807 05/21/15 0415 05/25/15 0757  AST 25 22 21   ALT 28 22 15   ALKPHOS 144* 134* 218*  BILITOT 0.3 0.2* 0.3  PROT 6.8 6.0* 6.0*  ALBUMIN 3.4* 2.9* 2.7*   CBC:  Recent Labs  05/12/15 1030 05/20/15 1807 05/21/15 0415 05/25/15 0757  WBC 8.6 7.4 7.8 6.4  NEUTROABS 6.3 4.3  --  3.8  HGB 10.6* 10.8* 9.9* 10.6*  HCT 30.1* 31.4* 29.0* 30.7*  MCV 80.9 83.1 82.6 82.3  PLT 180 360 342 337    Dg Chest 2 View  06/09/2015   CLINICAL DATA:  Routine checkup.  EXAM: CHEST  2 VIEW  COMPARISON:  None.  FINDINGS: The heart size and mediastinal contours are within normal limits. No pneumothorax or pleural effusion is noted. Minimal subsegmental atelectasis or scarring is noted laterally in right lung base. Mild subsegmental atelectasis or scarring is noted posteriorly in the left lower lobe. The visualized skeletal structures are unremarkable.  IMPRESSION: Bibasilar subsegmental atelectasis or scarring is noted, with left greater than right.   Electronically Signed   By: Marijo Conception, M.D.   On: 06/09/2015 15:51    ASSESSMENT/PLAN:  Physical deconditioning -  for home health PT, OT, ST, SW and CNA  Sacral fracture - continue TLSO when necessary for support; lidocaine 5% 1 patch transdermally to sacrum daily and oxycodone 10 mg 1 tab by mouth every 6 hours when necessary for pain  Neurogenic bladder - toilet patient every 4 hours while awake  Neurogenic bowel - Dulcolax suppository daily at 6 AM with digital stimulation as bowel program to help prevent  incontinence and continue FiberCon 625 mg 2 tabs = 1250 mg by mouth daily  Neuropathy  - recently decreased Neurontin 100 mg 1 capsule by mouth 2 times a day   Hypothyroidism - TSH 1.068; continue levothyroxine 100 g 1 tablet by mouth daily  Protein calorie malnutrition, severe - albumin 3.1; continue supplementation  Depression - mood is stable; continue Prozac 40 mg 1 capsule by mouth daily     I have filled out patient's discharge paperwork and written prescriptions.  Patient will receive home health PT, OT, SW and CNA.  DME provided:  Semi-electric hospital bed, high back wheelchair, basic cushion, legrests and anti-tippers  Total discharge time: Greater than 30 minutes  Discharge time involved coordination of the discharge process with social worker, nursing staff and therapy department. Medical justification for home health services/DME verified.   [place] [place]  [facility] [phone]

## 2015-07-01 DIAGNOSIS — H905 Unspecified sensorineural hearing loss: Secondary | ICD-10-CM | POA: Diagnosis not present

## 2015-07-01 DIAGNOSIS — D8689 Sarcoidosis of other sites: Secondary | ICD-10-CM | POA: Diagnosis not present

## 2015-07-01 DIAGNOSIS — M810 Age-related osteoporosis without current pathological fracture: Secondary | ICD-10-CM | POA: Diagnosis not present

## 2015-07-01 DIAGNOSIS — H548 Legal blindness, as defined in USA: Secondary | ICD-10-CM | POA: Diagnosis not present

## 2015-07-01 DIAGNOSIS — R32 Unspecified urinary incontinence: Secondary | ICD-10-CM | POA: Diagnosis not present

## 2015-07-01 DIAGNOSIS — R159 Full incontinence of feces: Secondary | ICD-10-CM | POA: Diagnosis not present

## 2015-07-04 ENCOUNTER — Telehealth: Payer: Self-pay | Admitting: *Deleted

## 2015-07-04 DIAGNOSIS — H905 Unspecified sensorineural hearing loss: Secondary | ICD-10-CM | POA: Diagnosis not present

## 2015-07-04 DIAGNOSIS — D8689 Sarcoidosis of other sites: Secondary | ICD-10-CM | POA: Diagnosis not present

## 2015-07-04 DIAGNOSIS — R159 Full incontinence of feces: Secondary | ICD-10-CM | POA: Diagnosis not present

## 2015-07-04 DIAGNOSIS — R32 Unspecified urinary incontinence: Secondary | ICD-10-CM | POA: Diagnosis not present

## 2015-07-04 DIAGNOSIS — H548 Legal blindness, as defined in USA: Secondary | ICD-10-CM | POA: Diagnosis not present

## 2015-07-04 DIAGNOSIS — M810 Age-related osteoporosis without current pathological fracture: Secondary | ICD-10-CM | POA: Diagnosis not present

## 2015-07-04 NOTE — Telephone Encounter (Signed)
Vallery Ridge, PT at [facility], called asking for verbal orders to begin home health PT.  Admission visit was completed and pt was deemed a viable candidate for homehealth PT. I called Vallery Ridge and gave the verbal orders to begin per office protocol and asked for orders to be faxed over to our clinic so they can be signed

## 2015-07-06 DIAGNOSIS — R32 Unspecified urinary incontinence: Secondary | ICD-10-CM | POA: Diagnosis not present

## 2015-07-06 DIAGNOSIS — R159 Full incontinence of feces: Secondary | ICD-10-CM | POA: Diagnosis not present

## 2015-07-06 DIAGNOSIS — H548 Legal blindness, as defined in USA: Secondary | ICD-10-CM | POA: Diagnosis not present

## 2015-07-06 DIAGNOSIS — D8689 Sarcoidosis of other sites: Secondary | ICD-10-CM | POA: Diagnosis not present

## 2015-07-06 DIAGNOSIS — M810 Age-related osteoporosis without current pathological fracture: Secondary | ICD-10-CM | POA: Diagnosis not present

## 2015-07-06 DIAGNOSIS — H905 Unspecified sensorineural hearing loss: Secondary | ICD-10-CM | POA: Diagnosis not present

## 2015-07-07 DIAGNOSIS — D8689 Sarcoidosis of other sites: Secondary | ICD-10-CM | POA: Diagnosis not present

## 2015-07-07 DIAGNOSIS — H905 Unspecified sensorineural hearing loss: Secondary | ICD-10-CM | POA: Diagnosis not present

## 2015-07-07 DIAGNOSIS — M810 Age-related osteoporosis without current pathological fracture: Secondary | ICD-10-CM | POA: Diagnosis not present

## 2015-07-07 DIAGNOSIS — R32 Unspecified urinary incontinence: Secondary | ICD-10-CM | POA: Diagnosis not present

## 2015-07-07 DIAGNOSIS — R159 Full incontinence of feces: Secondary | ICD-10-CM | POA: Diagnosis not present

## 2015-07-07 DIAGNOSIS — H548 Legal blindness, as defined in USA: Secondary | ICD-10-CM | POA: Diagnosis not present

## 2015-07-09 DIAGNOSIS — R32 Unspecified urinary incontinence: Secondary | ICD-10-CM | POA: Diagnosis not present

## 2015-07-09 DIAGNOSIS — M810 Age-related osteoporosis without current pathological fracture: Secondary | ICD-10-CM | POA: Diagnosis not present

## 2015-07-09 DIAGNOSIS — H548 Legal blindness, as defined in USA: Secondary | ICD-10-CM | POA: Diagnosis not present

## 2015-07-09 DIAGNOSIS — H905 Unspecified sensorineural hearing loss: Secondary | ICD-10-CM | POA: Diagnosis not present

## 2015-07-09 DIAGNOSIS — D8689 Sarcoidosis of other sites: Secondary | ICD-10-CM | POA: Diagnosis not present

## 2015-07-09 DIAGNOSIS — R159 Full incontinence of feces: Secondary | ICD-10-CM | POA: Diagnosis not present

## 2015-07-11 ENCOUNTER — Telehealth: Payer: Self-pay | Admitting: *Deleted

## 2015-07-11 DIAGNOSIS — R159 Full incontinence of feces: Secondary | ICD-10-CM | POA: Diagnosis not present

## 2015-07-11 DIAGNOSIS — S32131A Minimally displaced Zone III fracture of sacrum, initial encounter for closed fracture: Secondary | ICD-10-CM | POA: Diagnosis not present

## 2015-07-11 DIAGNOSIS — H548 Legal blindness, as defined in USA: Secondary | ICD-10-CM | POA: Diagnosis not present

## 2015-07-11 DIAGNOSIS — H905 Unspecified sensorineural hearing loss: Secondary | ICD-10-CM | POA: Diagnosis not present

## 2015-07-11 DIAGNOSIS — D8689 Sarcoidosis of other sites: Secondary | ICD-10-CM | POA: Diagnosis not present

## 2015-07-11 DIAGNOSIS — R32 Unspecified urinary incontinence: Secondary | ICD-10-CM | POA: Diagnosis not present

## 2015-07-11 DIAGNOSIS — M810 Age-related osteoporosis without current pathological fracture: Secondary | ICD-10-CM | POA: Diagnosis not present

## 2015-07-11 NOTE — Telephone Encounter (Signed)
Merry Proud from [facility] calling to see if we received their faxed medical necessity form for an air mattress for [place].  I let him know that we do have it and it is on Dr Letta Pate cart to be signed.

## 2015-07-12 ENCOUNTER — Encounter: Payer: Self-pay | Admitting: Physical Medicine & Rehabilitation

## 2015-07-12 ENCOUNTER — Ambulatory Visit (HOSPITAL_BASED_OUTPATIENT_CLINIC_OR_DEPARTMENT_OTHER): Payer: Medicare Other | Admitting: Physical Medicine & Rehabilitation

## 2015-07-12 ENCOUNTER — Encounter: Payer: Medicare Other | Attending: Physical Medicine & Rehabilitation

## 2015-07-12 VITALS — BP 101/62 | HR 79 | Resp 14

## 2015-07-12 DIAGNOSIS — S3422XS Injury of nerve root of sacral spine, sequela: Secondary | ICD-10-CM

## 2015-07-12 DIAGNOSIS — S3422XA Injury of nerve root of sacral spine, initial encounter: Secondary | ICD-10-CM | POA: Diagnosis not present

## 2015-07-12 DIAGNOSIS — N312 Flaccid neuropathic bladder, not elsewhere classified: Secondary | ICD-10-CM

## 2015-07-12 DIAGNOSIS — K592 Neurogenic bowel, not elsewhere classified: Secondary | ICD-10-CM

## 2015-07-12 DIAGNOSIS — H905 Unspecified sensorineural hearing loss: Secondary | ICD-10-CM | POA: Diagnosis not present

## 2015-07-12 DIAGNOSIS — R32 Unspecified urinary incontinence: Secondary | ICD-10-CM | POA: Diagnosis not present

## 2015-07-12 DIAGNOSIS — R159 Full incontinence of feces: Secondary | ICD-10-CM | POA: Diagnosis not present

## 2015-07-12 DIAGNOSIS — W19XXXA Unspecified fall, initial encounter: Secondary | ICD-10-CM | POA: Diagnosis not present

## 2015-07-12 DIAGNOSIS — M810 Age-related osteoporosis without current pathological fracture: Secondary | ICD-10-CM | POA: Diagnosis not present

## 2015-07-12 DIAGNOSIS — N319 Neuromuscular dysfunction of bladder, unspecified: Secondary | ICD-10-CM | POA: Insufficient documentation

## 2015-07-12 DIAGNOSIS — H548 Legal blindness, as defined in USA: Secondary | ICD-10-CM | POA: Diagnosis not present

## 2015-07-12 DIAGNOSIS — Z8249 Family history of ischemic heart disease and other diseases of the circulatory system: Secondary | ICD-10-CM | POA: Diagnosis not present

## 2015-07-12 DIAGNOSIS — D8689 Sarcoidosis of other sites: Secondary | ICD-10-CM | POA: Diagnosis not present

## 2015-07-12 NOTE — Progress Notes (Deleted)
   Subjective:    Patient ID: Jamie [name], female    DOB: Mar 17, 1956, 59 y.o.   MRN: [id number]  HPI   /.[id number] Review of Systems     Objective:   Physical Exam        Assessment & Plan:

## 2015-07-12 NOTE — Progress Notes (Signed)
Subjective:    Patient ID: Jamie [name], female    DOB: 1956/02/27, 59 y.o.   MRN: [id number] 59 year old female with h/o  Neurosarcoidosis with diffuse peripheral neuropathy, progressive gait disorder and significant visual impairment;  who sustained a fall on 05/09/15 with onset of sacral pain, bowel and bladder incontinence, difficulty walking with recurrent falls and inability to care for herself.  and bowel incontinence and difficulty walking. She was re-evaluated in ED on 05/20/15 after recurrent fall and MRI Lumbar spine showed sacral fracture S1-S2 with mild angulation and some edema and hemorrhage in the area.  Thoracic spine MRI demonstrated a small T10 superior endplate compression which appeared acute.  No surgical intervention needed per Dr. Kathyrn Sheriff and Dr. Lorin Mercy.  TLSO for support by Dr. Lorin Mercy. Neurology consulted for instability of gait with workup felt to be suspect nonfocal concussion like trauma to the spinal cord as no compressive lesions noted Admit date: 05/24/2015 Discharge date: 06/10/2015  HPI  [facility] ~[facility]days then d/c home, hired caregiver during the day, parents at night Hospital bed with rails up at night but is not moving much at night Needs assist putting down rails but otherwise can make it to bathroom with walker No Falls! No bowel accidents, no supp, sits on toilet about 15 min  No further need for ICP but has occ stress incont, discussed Kegel exercise Pain Inventory Average Pain 4 Pain Right Now 4 My pain is intermittent and burning  In the last 24 hours, has pain interfered with the following? General activity 0 Relation with others 0 Enjoyment of life 0 What TIME of day is your pain at its worst? evening Sleep (in general) Good  Pain is worse with: unsure Pain improves with: rest Relief from Meds: 4  Mobility walk with assistance use a walker how many minutes can you walk? 15 ability to climb steps?  no do you drive?  no needs help with  transfers Do you have any goals in this area?  yes  Function retired  Neuro/Psych bladder control problems bowel control problems numbness  Prior Studies hospital f/u  Physicians involved in your care hospital f/u   Family History  Problem Relation Age of Onset  . Hypertension Father    History   Social History  . Marital Status: Single    Spouse Name: N/A  . Number of Children: N/A  . Years of Education: N/A   Social History Main Topics  . Smoking status: Never Smoker   . Smokeless tobacco: Not on file  . Alcohol Use: No  . Drug Use: No  . Sexual Activity: Not on file   Other Topics Concern  . None   Social History Narrative   History reviewed. No pertinent past surgical history. Past Medical History  Diagnosis Date  . 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 Neurosarcoidosis in adult     followed at [facility]  . 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 Progressive gait disorder     Was treated for probable MS until diagnosed with Neurosarcoidosis about 10 years ago.    BP 101/62 mmHg  Pulse 79  Resp 14  SpO2 99%  Opioid Risk Score:   Fall Risk Score:  `1  Depression screen PHQ 2/9  Depression screen [facility] 2/9 07/12/2015 07/12/2015  Decreased Interest 0 0  Down, Depressed, Hopeless 0 0  PHQ - 2 Score 0 0  Altered sleeping 0 -  Tired, decreased energy 1 -  Change in appetite 0 -  Feeling bad or failure about yourself  0 -  Trouble concentrating 0 -  Moving slowly or fidgety/restless 3 -  Suicidal thoughts 0 -  PHQ-9 Score 4 -     Review of Systems  Gastrointestinal:       Bowel control problems  Genitourinary: Positive for dysuria.       Incontinence  Neurological: Positive for numbness.  All other systems reviewed and are negative.      Objective:   Physical Exam  Constitutional: She is oriented to person, place, and time. She appears well-developed and well-nourished.  HENT:  Head: Normocephalic and atraumatic.  Right  Ear: External ear normal.  Left Ear: External ear normal.  Eyes: Pupils are equal, round, and reactive to light.  Cardiovascular: Normal rate, regular rhythm and normal heart sounds.   Pulmonary/Chest: Effort normal and breath sounds normal.  Abdominal: Soft. Bowel sounds are normal.  Neurological: She is alert and oriented to person, place, and time. She displays abnormal reflex. She exhibits abnormal muscle tone.  Psychiatric: She has a normal mood and affect.  Nursing note and vitals reviewed.   Decreased sensation to pinprick in the S1 and S2 dermatomal distribution. No evidence of skin breakdown in the feet Extremities without swelling she does have compression hose on Motor strength in the upper extremities is 4+/5 in the deltoid, biceps, triceps, grip 4 minus in the hip flexors knee extensors ankle dorsiflexors and 3 minus and ankle plantar flexors Gen. No acute distress Mood and affect appropriate She has delayed responses Visual acuity is reduced      Assessment & Plan:  1.  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

## 2015-07-12 NOTE — Patient Instructions (Signed)
Kegel exercises  Let side rails down

## 2015-07-13 DIAGNOSIS — R32 Unspecified urinary incontinence: Secondary | ICD-10-CM | POA: Diagnosis not present

## 2015-07-13 DIAGNOSIS — M810 Age-related osteoporosis without current pathological fracture: Secondary | ICD-10-CM | POA: Diagnosis not present

## 2015-07-13 DIAGNOSIS — H548 Legal blindness, as defined in USA: Secondary | ICD-10-CM | POA: Diagnosis not present

## 2015-07-13 DIAGNOSIS — H905 Unspecified sensorineural hearing loss: Secondary | ICD-10-CM | POA: Diagnosis not present

## 2015-07-13 DIAGNOSIS — R159 Full incontinence of feces: Secondary | ICD-10-CM | POA: Diagnosis not present

## 2015-07-13 DIAGNOSIS — D8689 Sarcoidosis of other sites: Secondary | ICD-10-CM | POA: Diagnosis not present

## 2015-07-14 DIAGNOSIS — H548 Legal blindness, as defined in USA: Secondary | ICD-10-CM | POA: Diagnosis not present

## 2015-07-14 DIAGNOSIS — R159 Full incontinence of feces: Secondary | ICD-10-CM | POA: Diagnosis not present

## 2015-07-14 DIAGNOSIS — M810 Age-related osteoporosis without current pathological fracture: Secondary | ICD-10-CM | POA: Diagnosis not present

## 2015-07-14 DIAGNOSIS — D8689 Sarcoidosis of other sites: Secondary | ICD-10-CM | POA: Diagnosis not present

## 2015-07-14 DIAGNOSIS — R32 Unspecified urinary incontinence: Secondary | ICD-10-CM | POA: Diagnosis not present

## 2015-07-14 DIAGNOSIS — H905 Unspecified sensorineural hearing loss: Secondary | ICD-10-CM | POA: Diagnosis not present

## 2015-07-15 DIAGNOSIS — S3210XD Unspecified fracture of sacrum, subsequent encounter for fracture with routine healing: Secondary | ICD-10-CM | POA: Diagnosis not present

## 2015-07-15 DIAGNOSIS — R32 Unspecified urinary incontinence: Secondary | ICD-10-CM | POA: Diagnosis not present

## 2015-07-15 DIAGNOSIS — H905 Unspecified sensorineural hearing loss: Secondary | ICD-10-CM | POA: Diagnosis not present

## 2015-07-15 DIAGNOSIS — R159 Full incontinence of feces: Secondary | ICD-10-CM | POA: Diagnosis not present

## 2015-07-15 DIAGNOSIS — H548 Legal blindness, as defined in USA: Secondary | ICD-10-CM | POA: Diagnosis not present

## 2015-07-15 DIAGNOSIS — G629 Polyneuropathy, unspecified: Secondary | ICD-10-CM | POA: Diagnosis not present

## 2015-07-15 DIAGNOSIS — M810 Age-related osteoporosis without current pathological fracture: Secondary | ICD-10-CM | POA: Diagnosis not present

## 2015-07-15 DIAGNOSIS — D8689 Sarcoidosis of other sites: Secondary | ICD-10-CM | POA: Diagnosis not present

## 2015-07-15 DIAGNOSIS — E46 Unspecified protein-calorie malnutrition: Secondary | ICD-10-CM | POA: Diagnosis not present

## 2015-07-15 DIAGNOSIS — Z87440 Personal history of urinary (tract) infections: Secondary | ICD-10-CM | POA: Diagnosis not present

## 2015-07-15 DIAGNOSIS — Z9181 History of falling: Secondary | ICD-10-CM | POA: Diagnosis not present

## 2015-07-18 DIAGNOSIS — G629 Polyneuropathy, unspecified: Secondary | ICD-10-CM | POA: Diagnosis not present

## 2015-07-18 DIAGNOSIS — H548 Legal blindness, as defined in USA: Secondary | ICD-10-CM | POA: Diagnosis not present

## 2015-07-18 DIAGNOSIS — H905 Unspecified sensorineural hearing loss: Secondary | ICD-10-CM | POA: Diagnosis not present

## 2015-07-18 DIAGNOSIS — D8689 Sarcoidosis of other sites: Secondary | ICD-10-CM | POA: Diagnosis not present

## 2015-07-18 DIAGNOSIS — M810 Age-related osteoporosis without current pathological fracture: Secondary | ICD-10-CM | POA: Diagnosis not present

## 2015-07-18 DIAGNOSIS — S3210XD Unspecified fracture of sacrum, subsequent encounter for fracture with routine healing: Secondary | ICD-10-CM | POA: Diagnosis not present

## 2015-07-19 DIAGNOSIS — G629 Polyneuropathy, unspecified: Secondary | ICD-10-CM | POA: Diagnosis not present

## 2015-07-19 DIAGNOSIS — H548 Legal blindness, as defined in USA: Secondary | ICD-10-CM | POA: Diagnosis not present

## 2015-07-19 DIAGNOSIS — S3210XD Unspecified fracture of sacrum, subsequent encounter for fracture with routine healing: Secondary | ICD-10-CM | POA: Diagnosis not present

## 2015-07-19 DIAGNOSIS — D8689 Sarcoidosis of other sites: Secondary | ICD-10-CM | POA: Diagnosis not present

## 2015-07-19 DIAGNOSIS — H905 Unspecified sensorineural hearing loss: Secondary | ICD-10-CM | POA: Diagnosis not present

## 2015-07-19 DIAGNOSIS — M810 Age-related osteoporosis without current pathological fracture: Secondary | ICD-10-CM | POA: Diagnosis not present

## 2015-07-20 DIAGNOSIS — H469 Unspecified optic neuritis: Secondary | ICD-10-CM | POA: Diagnosis not present

## 2015-07-20 DIAGNOSIS — I639 Cerebral infarction, unspecified: Secondary | ICD-10-CM | POA: Diagnosis not present

## 2015-07-20 DIAGNOSIS — S3219XA Other fracture of sacrum, initial encounter for closed fracture: Secondary | ICD-10-CM | POA: Diagnosis not present

## 2015-07-20 DIAGNOSIS — E039 Hypothyroidism, unspecified: Secondary | ICD-10-CM | POA: Diagnosis not present

## 2015-07-20 DIAGNOSIS — M6281 Muscle weakness (generalized): Secondary | ICD-10-CM | POA: Diagnosis not present

## 2015-07-20 DIAGNOSIS — D8689 Sarcoidosis of other sites: Secondary | ICD-10-CM | POA: Diagnosis not present

## 2015-07-21 DIAGNOSIS — G629 Polyneuropathy, unspecified: Secondary | ICD-10-CM | POA: Diagnosis not present

## 2015-07-21 DIAGNOSIS — D8689 Sarcoidosis of other sites: Secondary | ICD-10-CM | POA: Diagnosis not present

## 2015-07-21 DIAGNOSIS — M810 Age-related osteoporosis without current pathological fracture: Secondary | ICD-10-CM | POA: Diagnosis not present

## 2015-07-21 DIAGNOSIS — H548 Legal blindness, as defined in USA: Secondary | ICD-10-CM | POA: Diagnosis not present

## 2015-07-21 DIAGNOSIS — S3210XD Unspecified fracture of sacrum, subsequent encounter for fracture with routine healing: Secondary | ICD-10-CM | POA: Diagnosis not present

## 2015-07-21 DIAGNOSIS — H905 Unspecified sensorineural hearing loss: Secondary | ICD-10-CM | POA: Diagnosis not present

## 2015-07-22 DIAGNOSIS — M810 Age-related osteoporosis without current pathological fracture: Secondary | ICD-10-CM | POA: Diagnosis not present

## 2015-07-22 DIAGNOSIS — S3210XD Unspecified fracture of sacrum, subsequent encounter for fracture with routine healing: Secondary | ICD-10-CM | POA: Diagnosis not present

## 2015-07-22 DIAGNOSIS — H548 Legal blindness, as defined in USA: Secondary | ICD-10-CM | POA: Diagnosis not present

## 2015-07-22 DIAGNOSIS — G629 Polyneuropathy, unspecified: Secondary | ICD-10-CM | POA: Diagnosis not present

## 2015-07-22 DIAGNOSIS — H905 Unspecified sensorineural hearing loss: Secondary | ICD-10-CM | POA: Diagnosis not present

## 2015-07-22 DIAGNOSIS — D8689 Sarcoidosis of other sites: Secondary | ICD-10-CM | POA: Diagnosis not present

## 2015-07-23 DIAGNOSIS — G629 Polyneuropathy, unspecified: Secondary | ICD-10-CM | POA: Diagnosis not present

## 2015-07-23 DIAGNOSIS — H548 Legal blindness, as defined in USA: Secondary | ICD-10-CM | POA: Diagnosis not present

## 2015-07-23 DIAGNOSIS — S3210XD Unspecified fracture of sacrum, subsequent encounter for fracture with routine healing: Secondary | ICD-10-CM | POA: Diagnosis not present

## 2015-07-23 DIAGNOSIS — H905 Unspecified sensorineural hearing loss: Secondary | ICD-10-CM | POA: Diagnosis not present

## 2015-07-23 DIAGNOSIS — D8689 Sarcoidosis of other sites: Secondary | ICD-10-CM | POA: Diagnosis not present

## 2015-07-23 DIAGNOSIS — M810 Age-related osteoporosis without current pathological fracture: Secondary | ICD-10-CM | POA: Diagnosis not present

## 2015-07-25 DIAGNOSIS — M810 Age-related osteoporosis without current pathological fracture: Secondary | ICD-10-CM | POA: Diagnosis not present

## 2015-07-25 DIAGNOSIS — D8689 Sarcoidosis of other sites: Secondary | ICD-10-CM | POA: Diagnosis not present

## 2015-07-25 DIAGNOSIS — H548 Legal blindness, as defined in USA: Secondary | ICD-10-CM | POA: Diagnosis not present

## 2015-07-25 DIAGNOSIS — H905 Unspecified sensorineural hearing loss: Secondary | ICD-10-CM | POA: Diagnosis not present

## 2015-07-25 DIAGNOSIS — S3210XD Unspecified fracture of sacrum, subsequent encounter for fracture with routine healing: Secondary | ICD-10-CM | POA: Diagnosis not present

## 2015-07-25 DIAGNOSIS — G629 Polyneuropathy, unspecified: Secondary | ICD-10-CM | POA: Diagnosis not present

## 2015-07-26 ENCOUNTER — Ambulatory Visit: Payer: Medicare Other | Admitting: Internal Medicine

## 2015-07-26 DIAGNOSIS — D8689 Sarcoidosis of other sites: Secondary | ICD-10-CM | POA: Diagnosis not present

## 2015-07-26 DIAGNOSIS — M810 Age-related osteoporosis without current pathological fracture: Secondary | ICD-10-CM | POA: Diagnosis not present

## 2015-07-26 DIAGNOSIS — S3210XD Unspecified fracture of sacrum, subsequent encounter for fracture with routine healing: Secondary | ICD-10-CM | POA: Diagnosis not present

## 2015-07-26 DIAGNOSIS — H548 Legal blindness, as defined in USA: Secondary | ICD-10-CM | POA: Diagnosis not present

## 2015-07-26 DIAGNOSIS — H905 Unspecified sensorineural hearing loss: Secondary | ICD-10-CM | POA: Diagnosis not present

## 2015-07-26 DIAGNOSIS — G629 Polyneuropathy, unspecified: Secondary | ICD-10-CM | POA: Diagnosis not present

## 2015-07-27 DIAGNOSIS — D8689 Sarcoidosis of other sites: Secondary | ICD-10-CM | POA: Diagnosis not present

## 2015-07-27 DIAGNOSIS — H905 Unspecified sensorineural hearing loss: Secondary | ICD-10-CM | POA: Diagnosis not present

## 2015-07-27 DIAGNOSIS — H548 Legal blindness, as defined in USA: Secondary | ICD-10-CM | POA: Diagnosis not present

## 2015-07-27 DIAGNOSIS — S3210XD Unspecified fracture of sacrum, subsequent encounter for fracture with routine healing: Secondary | ICD-10-CM | POA: Diagnosis not present

## 2015-07-27 DIAGNOSIS — M810 Age-related osteoporosis without current pathological fracture: Secondary | ICD-10-CM | POA: Diagnosis not present

## 2015-07-27 DIAGNOSIS — G629 Polyneuropathy, unspecified: Secondary | ICD-10-CM | POA: Diagnosis not present

## 2015-07-28 DIAGNOSIS — M810 Age-related osteoporosis without current pathological fracture: Secondary | ICD-10-CM | POA: Diagnosis not present

## 2015-07-28 DIAGNOSIS — H905 Unspecified sensorineural hearing loss: Secondary | ICD-10-CM | POA: Diagnosis not present

## 2015-07-28 DIAGNOSIS — H548 Legal blindness, as defined in USA: Secondary | ICD-10-CM | POA: Diagnosis not present

## 2015-07-28 DIAGNOSIS — D8689 Sarcoidosis of other sites: Secondary | ICD-10-CM | POA: Diagnosis not present

## 2015-07-28 DIAGNOSIS — G629 Polyneuropathy, unspecified: Secondary | ICD-10-CM | POA: Diagnosis not present

## 2015-07-28 DIAGNOSIS — S3210XD Unspecified fracture of sacrum, subsequent encounter for fracture with routine healing: Secondary | ICD-10-CM | POA: Diagnosis not present

## 2015-07-29 ENCOUNTER — Telehealth: Payer: Self-pay | Admitting: Physical Medicine & Rehabilitation

## 2015-07-29 DIAGNOSIS — H548 Legal blindness, as defined in USA: Secondary | ICD-10-CM | POA: Diagnosis not present

## 2015-07-29 DIAGNOSIS — M810 Age-related osteoporosis without current pathological fracture: Secondary | ICD-10-CM | POA: Diagnosis not present

## 2015-07-29 DIAGNOSIS — G629 Polyneuropathy, unspecified: Secondary | ICD-10-CM | POA: Diagnosis not present

## 2015-07-29 DIAGNOSIS — H905 Unspecified sensorineural hearing loss: Secondary | ICD-10-CM | POA: Diagnosis not present

## 2015-07-29 DIAGNOSIS — S3210XD Unspecified fracture of sacrum, subsequent encounter for fracture with routine healing: Secondary | ICD-10-CM | POA: Diagnosis not present

## 2015-07-29 DIAGNOSIS — D8689 Sarcoidosis of other sites: Secondary | ICD-10-CM | POA: Diagnosis not present

## 2015-07-29 NOTE — Telephone Encounter (Signed)
Henderson Newcomer OT with [facility] would like a call back at [phone].  Patient had a fall, few weeks after fall patient had a cervical and thoracic fracture and was put on back precautions.  Would like an end date of back precautions and not sure if Dr. Letta Pate was the doctor to let her know this or should she call neurosurgeon Dr. Kathyrn Sheriff.  Please advise.

## 2015-07-29 NOTE — Telephone Encounter (Signed)
Spoke with Dr. Letta Pate and he stated the neurosurgeon would make that call. Left Message for Manuela Schwartz to call Neurosurgeon regarding back precautions.

## 2015-07-30 DIAGNOSIS — D8689 Sarcoidosis of other sites: Secondary | ICD-10-CM | POA: Diagnosis not present

## 2015-07-30 DIAGNOSIS — H905 Unspecified sensorineural hearing loss: Secondary | ICD-10-CM | POA: Diagnosis not present

## 2015-07-30 DIAGNOSIS — S3210XD Unspecified fracture of sacrum, subsequent encounter for fracture with routine healing: Secondary | ICD-10-CM | POA: Diagnosis not present

## 2015-07-30 DIAGNOSIS — M810 Age-related osteoporosis without current pathological fracture: Secondary | ICD-10-CM | POA: Diagnosis not present

## 2015-07-30 DIAGNOSIS — G629 Polyneuropathy, unspecified: Secondary | ICD-10-CM | POA: Diagnosis not present

## 2015-07-30 DIAGNOSIS — H548 Legal blindness, as defined in USA: Secondary | ICD-10-CM | POA: Diagnosis not present

## 2015-07-31 DIAGNOSIS — S3210XD Unspecified fracture of sacrum, subsequent encounter for fracture with routine healing: Secondary | ICD-10-CM | POA: Diagnosis not present

## 2015-07-31 DIAGNOSIS — D8689 Sarcoidosis of other sites: Secondary | ICD-10-CM | POA: Diagnosis not present

## 2015-07-31 DIAGNOSIS — H905 Unspecified sensorineural hearing loss: Secondary | ICD-10-CM | POA: Diagnosis not present

## 2015-07-31 DIAGNOSIS — G629 Polyneuropathy, unspecified: Secondary | ICD-10-CM | POA: Diagnosis not present

## 2015-07-31 DIAGNOSIS — M810 Age-related osteoporosis without current pathological fracture: Secondary | ICD-10-CM | POA: Diagnosis not present

## 2015-07-31 DIAGNOSIS — H548 Legal blindness, as defined in USA: Secondary | ICD-10-CM | POA: Diagnosis not present

## 2015-08-01 DIAGNOSIS — M810 Age-related osteoporosis without current pathological fracture: Secondary | ICD-10-CM | POA: Diagnosis not present

## 2015-08-01 DIAGNOSIS — H548 Legal blindness, as defined in USA: Secondary | ICD-10-CM | POA: Diagnosis not present

## 2015-08-01 DIAGNOSIS — D8689 Sarcoidosis of other sites: Secondary | ICD-10-CM | POA: Diagnosis not present

## 2015-08-01 DIAGNOSIS — H905 Unspecified sensorineural hearing loss: Secondary | ICD-10-CM | POA: Diagnosis not present

## 2015-08-01 DIAGNOSIS — G629 Polyneuropathy, unspecified: Secondary | ICD-10-CM | POA: Diagnosis not present

## 2015-08-01 DIAGNOSIS — S3210XD Unspecified fracture of sacrum, subsequent encounter for fracture with routine healing: Secondary | ICD-10-CM | POA: Diagnosis not present

## 2015-08-02 DIAGNOSIS — H905 Unspecified sensorineural hearing loss: Secondary | ICD-10-CM | POA: Diagnosis not present

## 2015-08-02 DIAGNOSIS — S3210XD Unspecified fracture of sacrum, subsequent encounter for fracture with routine healing: Secondary | ICD-10-CM | POA: Diagnosis not present

## 2015-08-02 DIAGNOSIS — M810 Age-related osteoporosis without current pathological fracture: Secondary | ICD-10-CM | POA: Diagnosis not present

## 2015-08-02 DIAGNOSIS — H548 Legal blindness, as defined in USA: Secondary | ICD-10-CM | POA: Diagnosis not present

## 2015-08-02 DIAGNOSIS — G629 Polyneuropathy, unspecified: Secondary | ICD-10-CM | POA: Diagnosis not present

## 2015-08-02 DIAGNOSIS — D8689 Sarcoidosis of other sites: Secondary | ICD-10-CM | POA: Diagnosis not present

## 2015-08-03 DIAGNOSIS — S3210XD Unspecified fracture of sacrum, subsequent encounter for fracture with routine healing: Secondary | ICD-10-CM | POA: Diagnosis not present

## 2015-08-03 DIAGNOSIS — G629 Polyneuropathy, unspecified: Secondary | ICD-10-CM | POA: Diagnosis not present

## 2015-08-03 DIAGNOSIS — H548 Legal blindness, as defined in USA: Secondary | ICD-10-CM | POA: Diagnosis not present

## 2015-08-03 DIAGNOSIS — H905 Unspecified sensorineural hearing loss: Secondary | ICD-10-CM | POA: Diagnosis not present

## 2015-08-03 DIAGNOSIS — D8689 Sarcoidosis of other sites: Secondary | ICD-10-CM | POA: Diagnosis not present

## 2015-08-03 DIAGNOSIS — M810 Age-related osteoporosis without current pathological fracture: Secondary | ICD-10-CM | POA: Diagnosis not present

## 2015-08-04 DIAGNOSIS — G629 Polyneuropathy, unspecified: Secondary | ICD-10-CM | POA: Diagnosis not present

## 2015-08-04 DIAGNOSIS — S3210XD Unspecified fracture of sacrum, subsequent encounter for fracture with routine healing: Secondary | ICD-10-CM | POA: Diagnosis not present

## 2015-08-04 DIAGNOSIS — H548 Legal blindness, as defined in USA: Secondary | ICD-10-CM | POA: Diagnosis not present

## 2015-08-04 DIAGNOSIS — M810 Age-related osteoporosis without current pathological fracture: Secondary | ICD-10-CM | POA: Diagnosis not present

## 2015-08-04 DIAGNOSIS — D8689 Sarcoidosis of other sites: Secondary | ICD-10-CM | POA: Diagnosis not present

## 2015-08-04 DIAGNOSIS — H905 Unspecified sensorineural hearing loss: Secondary | ICD-10-CM | POA: Diagnosis not present

## 2015-08-05 DIAGNOSIS — S3210XD Unspecified fracture of sacrum, subsequent encounter for fracture with routine healing: Secondary | ICD-10-CM | POA: Diagnosis not present

## 2015-08-05 DIAGNOSIS — H905 Unspecified sensorineural hearing loss: Secondary | ICD-10-CM | POA: Diagnosis not present

## 2015-08-05 DIAGNOSIS — D8689 Sarcoidosis of other sites: Secondary | ICD-10-CM | POA: Diagnosis not present

## 2015-08-05 DIAGNOSIS — M810 Age-related osteoporosis without current pathological fracture: Secondary | ICD-10-CM | POA: Diagnosis not present

## 2015-08-05 DIAGNOSIS — H548 Legal blindness, as defined in USA: Secondary | ICD-10-CM | POA: Diagnosis not present

## 2015-08-05 DIAGNOSIS — G629 Polyneuropathy, unspecified: Secondary | ICD-10-CM | POA: Diagnosis not present

## 2015-08-08 DIAGNOSIS — S3210XD Unspecified fracture of sacrum, subsequent encounter for fracture with routine healing: Secondary | ICD-10-CM | POA: Diagnosis not present

## 2015-08-08 DIAGNOSIS — G629 Polyneuropathy, unspecified: Secondary | ICD-10-CM | POA: Diagnosis not present

## 2015-08-08 DIAGNOSIS — H548 Legal blindness, as defined in USA: Secondary | ICD-10-CM | POA: Diagnosis not present

## 2015-08-08 DIAGNOSIS — M810 Age-related osteoporosis without current pathological fracture: Secondary | ICD-10-CM | POA: Diagnosis not present

## 2015-08-08 DIAGNOSIS — D8689 Sarcoidosis of other sites: Secondary | ICD-10-CM | POA: Diagnosis not present

## 2015-08-08 DIAGNOSIS — H905 Unspecified sensorineural hearing loss: Secondary | ICD-10-CM | POA: Diagnosis not present

## 2015-08-09 DIAGNOSIS — H2513 Age-related nuclear cataract, bilateral: Secondary | ICD-10-CM | POA: Diagnosis not present

## 2015-08-10 DIAGNOSIS — H548 Legal blindness, as defined in USA: Secondary | ICD-10-CM | POA: Diagnosis not present

## 2015-08-10 DIAGNOSIS — H905 Unspecified sensorineural hearing loss: Secondary | ICD-10-CM | POA: Diagnosis not present

## 2015-08-10 DIAGNOSIS — D8689 Sarcoidosis of other sites: Secondary | ICD-10-CM | POA: Diagnosis not present

## 2015-08-10 DIAGNOSIS — M810 Age-related osteoporosis without current pathological fracture: Secondary | ICD-10-CM | POA: Diagnosis not present

## 2015-08-10 DIAGNOSIS — G629 Polyneuropathy, unspecified: Secondary | ICD-10-CM | POA: Diagnosis not present

## 2015-08-10 DIAGNOSIS — S3210XD Unspecified fracture of sacrum, subsequent encounter for fracture with routine healing: Secondary | ICD-10-CM | POA: Diagnosis not present

## 2015-08-11 ENCOUNTER — Ambulatory Visit (INDEPENDENT_AMBULATORY_CARE_PROVIDER_SITE_OTHER): Payer: Medicare Other | Admitting: Internal Medicine

## 2015-08-11 ENCOUNTER — Encounter: Payer: Self-pay | Admitting: Internal Medicine

## 2015-08-11 VITALS — BP 110/70 | HR 83 | Temp 98.2°F | Resp 12 | Ht 64.0 in | Wt 127.0 lb

## 2015-08-11 DIAGNOSIS — D8689 Sarcoidosis of other sites: Secondary | ICD-10-CM

## 2015-08-11 DIAGNOSIS — S3210XD Unspecified fracture of sacrum, subsequent encounter for fracture with routine healing: Secondary | ICD-10-CM | POA: Diagnosis not present

## 2015-08-11 DIAGNOSIS — N319 Neuromuscular dysfunction of bladder, unspecified: Secondary | ICD-10-CM

## 2015-08-11 DIAGNOSIS — R29898 Other symptoms and signs involving the musculoskeletal system: Secondary | ICD-10-CM

## 2015-08-11 DIAGNOSIS — N312 Flaccid neuropathic bladder, not elsewhere classified: Secondary | ICD-10-CM

## 2015-08-11 DIAGNOSIS — K592 Neurogenic bowel, not elsewhere classified: Secondary | ICD-10-CM

## 2015-08-11 MED ORDER — LEVOTHYROXINE SODIUM 100 MCG PO TABS: 100.0000 ug | ORAL_TABLET | Freq: Every day | ORAL | Status: DC

## 2015-08-11 NOTE — Progress Notes (Signed)
   Subjective:    Patient ID: Jamie [name], female    DOB: 03/21/1956, 59 y.o.   MRN: [id number]  HPI The patient is a 59 YO female who has significant PMH. Please see A/P for status and treatment of chronic medical problems. Her newest problem is a fall in May with sacral fracture and some paraplegia briefly. She entered rehab and now is ambulating with a walker. Her balance is still not good and she has had 2 minor falls since being home. No resultant injury and she did not seek care. Still has soreness in her sacrum. Not able to tell when she is having bowel movements. Able to feel a little bit her perineum and able to consciously urinate. That has been improving gradually during her recovery. Has been on prozac for decreased mood (situational due to poor living situation in the past but has been much better) and would like to try decreasing off her prozac. Mood has been good throughout her recovery. Able to do ADLs for herself but dependent in many IADLs. Has help from parents and caretakers daily and nightly as well.   PMH, [facility], social history reviewed and updated.   Review of Systems  Constitutional: Positive for activity change and fatigue. Negative for fever, chills, appetite change and unexpected weight change.  HENT: Negative.   Eyes: Negative.   Respiratory: Negative for cough, chest tightness, shortness of breath and wheezing.   Cardiovascular: Negative for chest pain, palpitations and leg swelling.  Gastrointestinal: Negative for abdominal pain, diarrhea, constipation, blood in stool and abdominal distention.       Lack of feeling with incontinence  Musculoskeletal: Positive for myalgias, arthralgias and gait problem.  Skin: Negative.   Neurological: Positive for weakness and numbness. Negative for dizziness, syncope and headaches.  Psychiatric/Behavioral: Negative.       Objective:   Physical Exam  Constitutional: She is oriented to person, place, and time. She appears  well-developed. No distress.  In wheelchair  HENT:  Head: Normocephalic and atraumatic.  Eyes: EOM are normal.  Neck: No JVD present. No thyromegaly present.  Cardiovascular: Normal rate and regular rhythm.   Pulmonary/Chest: Effort normal and breath sounds normal. No respiratory distress. She has no wheezes. She has no rales.  Abdominal: Soft. Bowel sounds are normal. She exhibits no distension. There is no tenderness. There is no rebound.  Musculoskeletal: She exhibits no edema.  Neurological: She is alert and oriented to person, place, and time. Coordination abnormal.  Uses walker at home  Skin: Skin is warm and dry.  Psychiatric: She has a normal mood and affect.   Filed Vitals:   08/11/15 0826  BP: 110/70  Pulse: 83  Temp: 98.2 F (36.8 C)  TempSrc: Oral  Resp: 12  Height: 5\' 4"  (1.626 m)  Weight: 127 lb (57.607 kg)  SpO2: 98%      Assessment & Plan:

## 2015-08-11 NOTE — Assessment & Plan Note (Signed)
Seeing [facility] and they were supposed to start a steroid course then start sinemet but have not due to insurance. They do have the bottle of sinemet. They are going to contact their doctor's office for further instructions as they have not heard from insurance.

## 2015-08-11 NOTE — Progress Notes (Signed)
Pre visit review using our clinic review tool, if applicable. No additional management support is needed unless otherwise documented below in the visit note. 

## 2015-08-11 NOTE — Assessment & Plan Note (Signed)
She is incontinence of bowels at this time and no to little sensation. She is still working with PT and OT for recovery. Seeing PMR in a couple weeks. Due to previous fracture with nerve compression.

## 2015-08-11 NOTE — Patient Instructions (Signed)
We have sent in the refills of the thyroid medicine.   We can have you go down to 20 mg daily of the prozac for 2 weeks. If you are still feeling good and doing well you can then stop the medicine.   If you start having low moods or depression call us back and we can talk about whether you may still need to be on it low dose or other options.

## 2015-08-11 NOTE — Assessment & Plan Note (Signed)
Appears to be recovering some and will continue to follow.

## 2015-08-11 NOTE — Assessment & Plan Note (Signed)
Doing better and walking with walker. Gait slow and unstable. Still working with PT and OT and now able to do most ADLs for herself but needs significant assistance with IADLs.

## 2015-08-12 DIAGNOSIS — S3210XD Unspecified fracture of sacrum, subsequent encounter for fracture with routine healing: Secondary | ICD-10-CM | POA: Diagnosis not present

## 2015-08-12 DIAGNOSIS — H905 Unspecified sensorineural hearing loss: Secondary | ICD-10-CM | POA: Diagnosis not present

## 2015-08-12 DIAGNOSIS — G629 Polyneuropathy, unspecified: Secondary | ICD-10-CM | POA: Diagnosis not present

## 2015-08-12 DIAGNOSIS — H548 Legal blindness, as defined in USA: Secondary | ICD-10-CM | POA: Diagnosis not present

## 2015-08-12 DIAGNOSIS — D8689 Sarcoidosis of other sites: Secondary | ICD-10-CM | POA: Diagnosis not present

## 2015-08-12 DIAGNOSIS — M810 Age-related osteoporosis without current pathological fracture: Secondary | ICD-10-CM | POA: Diagnosis not present

## 2015-08-13 DIAGNOSIS — M810 Age-related osteoporosis without current pathological fracture: Secondary | ICD-10-CM | POA: Diagnosis not present

## 2015-08-13 DIAGNOSIS — S3210XD Unspecified fracture of sacrum, subsequent encounter for fracture with routine healing: Secondary | ICD-10-CM | POA: Diagnosis not present

## 2015-08-13 DIAGNOSIS — H905 Unspecified sensorineural hearing loss: Secondary | ICD-10-CM | POA: Diagnosis not present

## 2015-08-13 DIAGNOSIS — D8689 Sarcoidosis of other sites: Secondary | ICD-10-CM | POA: Diagnosis not present

## 2015-08-13 DIAGNOSIS — H548 Legal blindness, as defined in USA: Secondary | ICD-10-CM | POA: Diagnosis not present

## 2015-08-13 DIAGNOSIS — G629 Polyneuropathy, unspecified: Secondary | ICD-10-CM | POA: Diagnosis not present

## 2015-08-14 NOTE — Assessment & Plan Note (Signed)
Gradually improving sensation and able to go consciously. Still following with PMR after her sacral fracture. Did not have surgical intervention.

## 2015-08-16 DIAGNOSIS — G629 Polyneuropathy, unspecified: Secondary | ICD-10-CM | POA: Diagnosis not present

## 2015-08-16 DIAGNOSIS — S3210XD Unspecified fracture of sacrum, subsequent encounter for fracture with routine healing: Secondary | ICD-10-CM | POA: Diagnosis not present

## 2015-08-16 DIAGNOSIS — D8689 Sarcoidosis of other sites: Secondary | ICD-10-CM | POA: Diagnosis not present

## 2015-08-16 DIAGNOSIS — H548 Legal blindness, as defined in USA: Secondary | ICD-10-CM | POA: Diagnosis not present

## 2015-08-16 DIAGNOSIS — M810 Age-related osteoporosis without current pathological fracture: Secondary | ICD-10-CM | POA: Diagnosis not present

## 2015-08-16 DIAGNOSIS — H905 Unspecified sensorineural hearing loss: Secondary | ICD-10-CM | POA: Diagnosis not present

## 2015-08-18 DIAGNOSIS — S3210XD Unspecified fracture of sacrum, subsequent encounter for fracture with routine healing: Secondary | ICD-10-CM | POA: Diagnosis not present

## 2015-08-18 DIAGNOSIS — M810 Age-related osteoporosis without current pathological fracture: Secondary | ICD-10-CM | POA: Diagnosis not present

## 2015-08-18 DIAGNOSIS — G629 Polyneuropathy, unspecified: Secondary | ICD-10-CM | POA: Diagnosis not present

## 2015-08-18 DIAGNOSIS — D8689 Sarcoidosis of other sites: Secondary | ICD-10-CM | POA: Diagnosis not present

## 2015-08-18 DIAGNOSIS — H548 Legal blindness, as defined in USA: Secondary | ICD-10-CM | POA: Diagnosis not present

## 2015-08-18 DIAGNOSIS — H905 Unspecified sensorineural hearing loss: Secondary | ICD-10-CM | POA: Diagnosis not present

## 2015-08-19 ENCOUNTER — Telehealth: Payer: Self-pay | Admitting: Internal Medicine

## 2015-08-19 DIAGNOSIS — G629 Polyneuropathy, unspecified: Secondary | ICD-10-CM | POA: Diagnosis not present

## 2015-08-19 DIAGNOSIS — D8689 Sarcoidosis of other sites: Secondary | ICD-10-CM | POA: Diagnosis not present

## 2015-08-19 DIAGNOSIS — S3210XD Unspecified fracture of sacrum, subsequent encounter for fracture with routine healing: Secondary | ICD-10-CM | POA: Diagnosis not present

## 2015-08-19 DIAGNOSIS — H548 Legal blindness, as defined in USA: Secondary | ICD-10-CM | POA: Diagnosis not present

## 2015-08-19 DIAGNOSIS — H905 Unspecified sensorineural hearing loss: Secondary | ICD-10-CM | POA: Diagnosis not present

## 2015-08-19 DIAGNOSIS — M810 Age-related osteoporosis without current pathological fracture: Secondary | ICD-10-CM | POA: Diagnosis not present

## 2015-08-19 NOTE — Telephone Encounter (Signed)
Jamie [name] is calling to request [facility] for PT,Ot, and nursing: freq 3x/week for 3 weeks for Pt and OT- nursing is 1x/week for those 3 weeks.   She is also request  A social work order.

## 2015-08-19 NOTE — Telephone Encounter (Signed)
Called and left verbal orders. 

## 2015-08-22 DIAGNOSIS — H905 Unspecified sensorineural hearing loss: Secondary | ICD-10-CM | POA: Diagnosis not present

## 2015-08-22 DIAGNOSIS — G629 Polyneuropathy, unspecified: Secondary | ICD-10-CM | POA: Diagnosis not present

## 2015-08-22 DIAGNOSIS — S3210XD Unspecified fracture of sacrum, subsequent encounter for fracture with routine healing: Secondary | ICD-10-CM | POA: Diagnosis not present

## 2015-08-22 DIAGNOSIS — H548 Legal blindness, as defined in USA: Secondary | ICD-10-CM | POA: Diagnosis not present

## 2015-08-22 DIAGNOSIS — D8689 Sarcoidosis of other sites: Secondary | ICD-10-CM | POA: Diagnosis not present

## 2015-08-22 DIAGNOSIS — M810 Age-related osteoporosis without current pathological fracture: Secondary | ICD-10-CM | POA: Diagnosis not present

## 2015-08-23 ENCOUNTER — Encounter: Payer: Medicare Other | Attending: Physical Medicine & Rehabilitation

## 2015-08-23 ENCOUNTER — Encounter: Payer: Self-pay | Admitting: Physical Medicine & Rehabilitation

## 2015-08-23 ENCOUNTER — Ambulatory Visit (HOSPITAL_BASED_OUTPATIENT_CLINIC_OR_DEPARTMENT_OTHER): Payer: Medicare Other | Admitting: Physical Medicine & Rehabilitation

## 2015-08-23 ENCOUNTER — Other Ambulatory Visit: Payer: Medicare Other

## 2015-08-23 VITALS — BP 123/78 | HR 92 | Resp 14

## 2015-08-23 DIAGNOSIS — M810 Age-related osteoporosis without current pathological fracture: Secondary | ICD-10-CM | POA: Diagnosis not present

## 2015-08-23 DIAGNOSIS — N319 Neuromuscular dysfunction of bladder, unspecified: Secondary | ICD-10-CM | POA: Diagnosis not present

## 2015-08-23 DIAGNOSIS — D8689 Sarcoidosis of other sites: Secondary | ICD-10-CM | POA: Diagnosis not present

## 2015-08-23 DIAGNOSIS — S3422XA Injury of nerve root of sacral spine, initial encounter: Secondary | ICD-10-CM | POA: Insufficient documentation

## 2015-08-23 DIAGNOSIS — G629 Polyneuropathy, unspecified: Secondary | ICD-10-CM | POA: Diagnosis not present

## 2015-08-23 DIAGNOSIS — S3422XS Injury of nerve root of sacral spine, sequela: Secondary | ICD-10-CM

## 2015-08-23 DIAGNOSIS — S3210XS Unspecified fracture of sacrum, sequela: Secondary | ICD-10-CM

## 2015-08-23 DIAGNOSIS — H548 Legal blindness, as defined in USA: Secondary | ICD-10-CM | POA: Diagnosis not present

## 2015-08-23 DIAGNOSIS — S3210XD Unspecified fracture of sacrum, subsequent encounter for fracture with routine healing: Secondary | ICD-10-CM | POA: Diagnosis not present

## 2015-08-23 DIAGNOSIS — K592 Neurogenic bowel, not elsewhere classified: Secondary | ICD-10-CM | POA: Insufficient documentation

## 2015-08-23 DIAGNOSIS — Z8249 Family history of ischemic heart disease and other diseases of the circulatory system: Secondary | ICD-10-CM | POA: Insufficient documentation

## 2015-08-23 DIAGNOSIS — H905 Unspecified sensorineural hearing loss: Secondary | ICD-10-CM | POA: Diagnosis not present

## 2015-08-23 NOTE — Patient Instructions (Signed)
Sacral nerve injury will take up to 1 yr before we can say how much recovery you will  Have.

## 2015-08-23 NOTE — Progress Notes (Signed)
Subjective:    Patient ID: Jamie [name], female    DOB: 02/18/1956, 59 y.o.   MRN: [id number]  HPI Falling at home on 2 occassions fell on buttocks, Some temporary exacerbation of pain but no long-term increase. Lives at [place] handicapped accessible.She has a hired caregiver and also her parents  help her as well  Gets HHPT and OT, Patient is not sure how much longer they will be coming out  Not taking pain med, pain is fairly well-controlled without medication at the current time.  Still with groin numbness, has decreased urinary continence however no retention at the current time.  She is having regular bowel movements. Pain Inventory Average Pain 3 Pain Right Now 3 My pain is burning  In the last 24 hours, has pain interfered with the following? General activity 0 Relation with others 0 Enjoyment of life 0 What TIME of day is your pain at its worst? night Sleep (in general) Good  Pain is worse with: sitting Pain improves with: nothing Relief from Meds: 0  Mobility use a walker how many minutes can you walk? 15 ability to climb steps?  no do you drive?  no needs help with transfers  Function disabled: date disabled . I need assistance with the following:  household duties Do you have any goals in this area?  yes  Neuro/Psych bladder control problems bowel control problems numbness trouble walking  Prior Studies Any changes since last visit?  no  Physicians involved in your care Any changes since last visit?  no   Family History  Problem Relation Age of Onset  . Hypertension Father    Social History   Social History  . Marital Status: Single    Spouse Name: N/A  . Number of Children: N/A  . Years of Education: N/A   Social History Main Topics  . Smoking status: Never Smoker   . Smokeless tobacco: None  . Alcohol Use: No  . Drug Use: No  . Sexual Activity: Not Asked   Other Topics Concern  . None   Social History Narrative    History reviewed. No pertinent past surgical history. Past Medical History  Diagnosis Date  . 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 Neurosarcoidosis in adult     followed at [facility]  . 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 Progressive gait disorder     Was treated for probable MS until diagnosed with Neurosarcoidosis about 10 years ago.   . Thyroid disease   . Depression    BP 123/78 mmHg  Pulse 92  Resp 14  SpO2 99%  Opioid Risk Score:   Fall Risk Score:  `1  Depression screen PHQ 2/9  Depression screen [facility] 2/9 07/12/2015 07/12/2015  Decreased Interest 0 0  Down, Depressed, Hopeless 0 0  PHQ - 2 Score 0 0  Altered sleeping 0 -  Tired, decreased energy 1 -  Change in appetite 0 -  Feeling bad or failure about yourself  0 -  Trouble concentrating 0 -  Moving slowly or fidgety/restless 3 -  Suicidal thoughts 0 -  PHQ-9 Score 4 -     Review of Systems  Constitutional: Positive for unexpected weight change.  Gastrointestinal:       Bowel control problems  Genitourinary: Positive for dysuria.  Musculoskeletal: Positive for gait problem.  Neurological: Positive for numbness.  All other systems reviewed and are negative.      Objective:   Physical Exam  Well-developed well-nourished female in no acute distress Patient is legally blind She is oriented, has inappropriate affect  Motor strength is 5/5 bilateral deltoid, biceps, triceps, grip 4 minus at bilateral hip flexor knee extensors ankle dorsiflexor and 3 minus at the ankle plantar flexors and toe flexors.  Sensation is reduced at the lateral border of the foot. Also has decreased sensation in the sacral area. No pain to palpation in the lumbar area. She has reduced lumbar range of motion with less than 50% flexion and extension lateral bending and rotation. Negative straight leg raising      Assessment & Plan:  1. Sacral fracture with sacral nerve root injury appears to be Multilevel  involving S1-S2 S3-S4 and potentially S5  Patient still has motor weakness in the toe flexors and ankle plantar flexors. In addition she has decreased sensation in the sacral dermatomes of the feet. In addition she has perineal numbness As well as neurogenic bowel bladder.  I will make a referral to urology to further assess her bladder function.  Continue home health PT OT

## 2015-08-24 DIAGNOSIS — H548 Legal blindness, as defined in USA: Secondary | ICD-10-CM | POA: Diagnosis not present

## 2015-08-24 DIAGNOSIS — M810 Age-related osteoporosis without current pathological fracture: Secondary | ICD-10-CM | POA: Diagnosis not present

## 2015-08-24 DIAGNOSIS — S3210XD Unspecified fracture of sacrum, subsequent encounter for fracture with routine healing: Secondary | ICD-10-CM | POA: Diagnosis not present

## 2015-08-24 DIAGNOSIS — D8689 Sarcoidosis of other sites: Secondary | ICD-10-CM | POA: Diagnosis not present

## 2015-08-24 DIAGNOSIS — G629 Polyneuropathy, unspecified: Secondary | ICD-10-CM | POA: Diagnosis not present

## 2015-08-24 DIAGNOSIS — H905 Unspecified sensorineural hearing loss: Secondary | ICD-10-CM | POA: Diagnosis not present

## 2015-08-25 DIAGNOSIS — H905 Unspecified sensorineural hearing loss: Secondary | ICD-10-CM | POA: Diagnosis not present

## 2015-08-25 DIAGNOSIS — D8689 Sarcoidosis of other sites: Secondary | ICD-10-CM | POA: Diagnosis not present

## 2015-08-25 DIAGNOSIS — G629 Polyneuropathy, unspecified: Secondary | ICD-10-CM | POA: Diagnosis not present

## 2015-08-25 DIAGNOSIS — M810 Age-related osteoporosis without current pathological fracture: Secondary | ICD-10-CM | POA: Diagnosis not present

## 2015-08-25 DIAGNOSIS — S3210XD Unspecified fracture of sacrum, subsequent encounter for fracture with routine healing: Secondary | ICD-10-CM | POA: Diagnosis not present

## 2015-08-25 DIAGNOSIS — H548 Legal blindness, as defined in USA: Secondary | ICD-10-CM | POA: Diagnosis not present

## 2015-08-29 DIAGNOSIS — H548 Legal blindness, as defined in USA: Secondary | ICD-10-CM | POA: Diagnosis not present

## 2015-08-29 DIAGNOSIS — M810 Age-related osteoporosis without current pathological fracture: Secondary | ICD-10-CM | POA: Diagnosis not present

## 2015-08-29 DIAGNOSIS — G629 Polyneuropathy, unspecified: Secondary | ICD-10-CM | POA: Diagnosis not present

## 2015-08-29 DIAGNOSIS — S3210XD Unspecified fracture of sacrum, subsequent encounter for fracture with routine healing: Secondary | ICD-10-CM | POA: Diagnosis not present

## 2015-08-29 DIAGNOSIS — D8689 Sarcoidosis of other sites: Secondary | ICD-10-CM | POA: Diagnosis not present

## 2015-08-29 DIAGNOSIS — H905 Unspecified sensorineural hearing loss: Secondary | ICD-10-CM | POA: Diagnosis not present

## 2015-08-30 DIAGNOSIS — G629 Polyneuropathy, unspecified: Secondary | ICD-10-CM | POA: Diagnosis not present

## 2015-08-30 DIAGNOSIS — S3210XD Unspecified fracture of sacrum, subsequent encounter for fracture with routine healing: Secondary | ICD-10-CM | POA: Diagnosis not present

## 2015-08-30 DIAGNOSIS — H548 Legal blindness, as defined in USA: Secondary | ICD-10-CM | POA: Diagnosis not present

## 2015-08-30 DIAGNOSIS — M810 Age-related osteoporosis without current pathological fracture: Secondary | ICD-10-CM | POA: Diagnosis not present

## 2015-08-30 DIAGNOSIS — D8689 Sarcoidosis of other sites: Secondary | ICD-10-CM | POA: Diagnosis not present

## 2015-08-30 DIAGNOSIS — H905 Unspecified sensorineural hearing loss: Secondary | ICD-10-CM | POA: Diagnosis not present

## 2015-08-31 DIAGNOSIS — G629 Polyneuropathy, unspecified: Secondary | ICD-10-CM | POA: Diagnosis not present

## 2015-08-31 DIAGNOSIS — H548 Legal blindness, as defined in USA: Secondary | ICD-10-CM | POA: Diagnosis not present

## 2015-08-31 DIAGNOSIS — H905 Unspecified sensorineural hearing loss: Secondary | ICD-10-CM | POA: Diagnosis not present

## 2015-08-31 DIAGNOSIS — M810 Age-related osteoporosis without current pathological fracture: Secondary | ICD-10-CM | POA: Diagnosis not present

## 2015-08-31 DIAGNOSIS — S3210XD Unspecified fracture of sacrum, subsequent encounter for fracture with routine healing: Secondary | ICD-10-CM | POA: Diagnosis not present

## 2015-08-31 DIAGNOSIS — D8689 Sarcoidosis of other sites: Secondary | ICD-10-CM | POA: Diagnosis not present

## 2015-09-01 DIAGNOSIS — H905 Unspecified sensorineural hearing loss: Secondary | ICD-10-CM | POA: Diagnosis not present

## 2015-09-01 DIAGNOSIS — G629 Polyneuropathy, unspecified: Secondary | ICD-10-CM | POA: Diagnosis not present

## 2015-09-01 DIAGNOSIS — D8689 Sarcoidosis of other sites: Secondary | ICD-10-CM | POA: Diagnosis not present

## 2015-09-01 DIAGNOSIS — H548 Legal blindness, as defined in USA: Secondary | ICD-10-CM | POA: Diagnosis not present

## 2015-09-01 DIAGNOSIS — S3210XD Unspecified fracture of sacrum, subsequent encounter for fracture with routine healing: Secondary | ICD-10-CM | POA: Diagnosis not present

## 2015-09-01 DIAGNOSIS — M810 Age-related osteoporosis without current pathological fracture: Secondary | ICD-10-CM | POA: Diagnosis not present

## 2015-09-02 DIAGNOSIS — M810 Age-related osteoporosis without current pathological fracture: Secondary | ICD-10-CM | POA: Diagnosis not present

## 2015-09-02 DIAGNOSIS — H905 Unspecified sensorineural hearing loss: Secondary | ICD-10-CM | POA: Diagnosis not present

## 2015-09-02 DIAGNOSIS — G629 Polyneuropathy, unspecified: Secondary | ICD-10-CM | POA: Diagnosis not present

## 2015-09-02 DIAGNOSIS — D8689 Sarcoidosis of other sites: Secondary | ICD-10-CM | POA: Diagnosis not present

## 2015-09-02 DIAGNOSIS — H548 Legal blindness, as defined in USA: Secondary | ICD-10-CM | POA: Diagnosis not present

## 2015-09-02 DIAGNOSIS — S3210XD Unspecified fracture of sacrum, subsequent encounter for fracture with routine healing: Secondary | ICD-10-CM | POA: Diagnosis not present

## 2015-09-04 DIAGNOSIS — Z23 Encounter for immunization: Secondary | ICD-10-CM | POA: Diagnosis not present

## 2015-09-05 DIAGNOSIS — D8689 Sarcoidosis of other sites: Secondary | ICD-10-CM | POA: Diagnosis not present

## 2015-09-05 DIAGNOSIS — G629 Polyneuropathy, unspecified: Secondary | ICD-10-CM | POA: Diagnosis not present

## 2015-09-05 DIAGNOSIS — H905 Unspecified sensorineural hearing loss: Secondary | ICD-10-CM | POA: Diagnosis not present

## 2015-09-05 DIAGNOSIS — H548 Legal blindness, as defined in USA: Secondary | ICD-10-CM | POA: Diagnosis not present

## 2015-09-05 DIAGNOSIS — S3210XD Unspecified fracture of sacrum, subsequent encounter for fracture with routine healing: Secondary | ICD-10-CM | POA: Diagnosis not present

## 2015-09-05 DIAGNOSIS — M810 Age-related osteoporosis without current pathological fracture: Secondary | ICD-10-CM | POA: Diagnosis not present

## 2015-09-06 DIAGNOSIS — S3210XD Unspecified fracture of sacrum, subsequent encounter for fracture with routine healing: Secondary | ICD-10-CM | POA: Diagnosis not present

## 2015-09-06 DIAGNOSIS — G629 Polyneuropathy, unspecified: Secondary | ICD-10-CM | POA: Diagnosis not present

## 2015-09-06 DIAGNOSIS — H905 Unspecified sensorineural hearing loss: Secondary | ICD-10-CM | POA: Diagnosis not present

## 2015-09-06 DIAGNOSIS — D8689 Sarcoidosis of other sites: Secondary | ICD-10-CM | POA: Diagnosis not present

## 2015-09-06 DIAGNOSIS — H548 Legal blindness, as defined in USA: Secondary | ICD-10-CM | POA: Diagnosis not present

## 2015-09-06 DIAGNOSIS — M810 Age-related osteoporosis without current pathological fracture: Secondary | ICD-10-CM | POA: Diagnosis not present

## 2015-09-07 DIAGNOSIS — D8689 Sarcoidosis of other sites: Secondary | ICD-10-CM | POA: Diagnosis not present

## 2015-09-07 DIAGNOSIS — M810 Age-related osteoporosis without current pathological fracture: Secondary | ICD-10-CM | POA: Diagnosis not present

## 2015-09-07 DIAGNOSIS — H548 Legal blindness, as defined in USA: Secondary | ICD-10-CM | POA: Diagnosis not present

## 2015-09-07 DIAGNOSIS — S3210XD Unspecified fracture of sacrum, subsequent encounter for fracture with routine healing: Secondary | ICD-10-CM | POA: Diagnosis not present

## 2015-09-07 DIAGNOSIS — G629 Polyneuropathy, unspecified: Secondary | ICD-10-CM | POA: Diagnosis not present

## 2015-09-07 DIAGNOSIS — H905 Unspecified sensorineural hearing loss: Secondary | ICD-10-CM | POA: Diagnosis not present

## 2015-09-08 DIAGNOSIS — M810 Age-related osteoporosis without current pathological fracture: Secondary | ICD-10-CM | POA: Diagnosis not present

## 2015-09-08 DIAGNOSIS — G629 Polyneuropathy, unspecified: Secondary | ICD-10-CM | POA: Diagnosis not present

## 2015-09-08 DIAGNOSIS — D8689 Sarcoidosis of other sites: Secondary | ICD-10-CM | POA: Diagnosis not present

## 2015-09-08 DIAGNOSIS — H548 Legal blindness, as defined in USA: Secondary | ICD-10-CM | POA: Diagnosis not present

## 2015-09-08 DIAGNOSIS — S3210XD Unspecified fracture of sacrum, subsequent encounter for fracture with routine healing: Secondary | ICD-10-CM | POA: Diagnosis not present

## 2015-09-08 DIAGNOSIS — H905 Unspecified sensorineural hearing loss: Secondary | ICD-10-CM | POA: Diagnosis not present

## 2015-09-09 DIAGNOSIS — S3210XD Unspecified fracture of sacrum, subsequent encounter for fracture with routine healing: Secondary | ICD-10-CM | POA: Diagnosis not present

## 2015-09-09 DIAGNOSIS — H548 Legal blindness, as defined in USA: Secondary | ICD-10-CM | POA: Diagnosis not present

## 2015-09-09 DIAGNOSIS — D8689 Sarcoidosis of other sites: Secondary | ICD-10-CM | POA: Diagnosis not present

## 2015-09-09 DIAGNOSIS — G629 Polyneuropathy, unspecified: Secondary | ICD-10-CM | POA: Diagnosis not present

## 2015-09-09 DIAGNOSIS — M810 Age-related osteoporosis without current pathological fracture: Secondary | ICD-10-CM | POA: Diagnosis not present

## 2015-09-09 DIAGNOSIS — H905 Unspecified sensorineural hearing loss: Secondary | ICD-10-CM | POA: Diagnosis not present

## 2015-09-13 DIAGNOSIS — H905 Unspecified sensorineural hearing loss: Secondary | ICD-10-CM | POA: Diagnosis not present

## 2015-09-13 DIAGNOSIS — M810 Age-related osteoporosis without current pathological fracture: Secondary | ICD-10-CM | POA: Diagnosis not present

## 2015-09-13 DIAGNOSIS — Z87440 Personal history of urinary (tract) infections: Secondary | ICD-10-CM | POA: Diagnosis not present

## 2015-09-13 DIAGNOSIS — S3210XD Unspecified fracture of sacrum, subsequent encounter for fracture with routine healing: Secondary | ICD-10-CM | POA: Diagnosis not present

## 2015-09-13 DIAGNOSIS — G629 Polyneuropathy, unspecified: Secondary | ICD-10-CM | POA: Diagnosis not present

## 2015-09-13 DIAGNOSIS — H548 Legal blindness, as defined in USA: Secondary | ICD-10-CM | POA: Diagnosis not present

## 2015-09-13 DIAGNOSIS — Z9181 History of falling: Secondary | ICD-10-CM | POA: Diagnosis not present

## 2015-09-13 DIAGNOSIS — R32 Unspecified urinary incontinence: Secondary | ICD-10-CM | POA: Diagnosis not present

## 2015-09-13 DIAGNOSIS — D8689 Sarcoidosis of other sites: Secondary | ICD-10-CM | POA: Diagnosis not present

## 2015-09-13 DIAGNOSIS — E46 Unspecified protein-calorie malnutrition: Secondary | ICD-10-CM | POA: Diagnosis not present

## 2015-09-13 DIAGNOSIS — R159 Full incontinence of feces: Secondary | ICD-10-CM | POA: Diagnosis not present

## 2015-09-14 ENCOUNTER — Telehealth: Payer: Self-pay | Admitting: Internal Medicine

## 2015-09-14 DIAGNOSIS — M810 Age-related osteoporosis without current pathological fracture: Secondary | ICD-10-CM | POA: Diagnosis not present

## 2015-09-14 DIAGNOSIS — G629 Polyneuropathy, unspecified: Secondary | ICD-10-CM | POA: Diagnosis not present

## 2015-09-14 DIAGNOSIS — H548 Legal blindness, as defined in USA: Secondary | ICD-10-CM | POA: Diagnosis not present

## 2015-09-14 DIAGNOSIS — D8689 Sarcoidosis of other sites: Secondary | ICD-10-CM | POA: Diagnosis not present

## 2015-09-14 DIAGNOSIS — H905 Unspecified sensorineural hearing loss: Secondary | ICD-10-CM | POA: Diagnosis not present

## 2015-09-14 DIAGNOSIS — S3210XD Unspecified fracture of sacrum, subsequent encounter for fracture with routine healing: Secondary | ICD-10-CM | POA: Diagnosis not present

## 2015-09-14 NOTE — Telephone Encounter (Signed)
Jamie [name] from [facility] requesting to continue home health care for OT and PT Her # is [phone]

## 2015-09-14 NOTE — Telephone Encounter (Signed)
Left message on voice mail giving verbal ok to continue PT and OT

## 2015-09-15 DIAGNOSIS — S3210XD Unspecified fracture of sacrum, subsequent encounter for fracture with routine healing: Secondary | ICD-10-CM | POA: Diagnosis not present

## 2015-09-15 DIAGNOSIS — G629 Polyneuropathy, unspecified: Secondary | ICD-10-CM | POA: Diagnosis not present

## 2015-09-15 DIAGNOSIS — H905 Unspecified sensorineural hearing loss: Secondary | ICD-10-CM | POA: Diagnosis not present

## 2015-09-15 DIAGNOSIS — M810 Age-related osteoporosis without current pathological fracture: Secondary | ICD-10-CM | POA: Diagnosis not present

## 2015-09-15 DIAGNOSIS — D8689 Sarcoidosis of other sites: Secondary | ICD-10-CM | POA: Diagnosis not present

## 2015-09-15 DIAGNOSIS — H548 Legal blindness, as defined in USA: Secondary | ICD-10-CM | POA: Diagnosis not present

## 2015-09-19 DIAGNOSIS — D8689 Sarcoidosis of other sites: Secondary | ICD-10-CM | POA: Diagnosis not present

## 2015-09-19 DIAGNOSIS — S3210XD Unspecified fracture of sacrum, subsequent encounter for fracture with routine healing: Secondary | ICD-10-CM | POA: Diagnosis not present

## 2015-09-19 DIAGNOSIS — G629 Polyneuropathy, unspecified: Secondary | ICD-10-CM | POA: Diagnosis not present

## 2015-09-19 DIAGNOSIS — H905 Unspecified sensorineural hearing loss: Secondary | ICD-10-CM | POA: Diagnosis not present

## 2015-09-19 DIAGNOSIS — H548 Legal blindness, as defined in USA: Secondary | ICD-10-CM | POA: Diagnosis not present

## 2015-09-19 DIAGNOSIS — M810 Age-related osteoporosis without current pathological fracture: Secondary | ICD-10-CM | POA: Diagnosis not present

## 2015-09-20 DIAGNOSIS — G629 Polyneuropathy, unspecified: Secondary | ICD-10-CM | POA: Diagnosis not present

## 2015-09-20 DIAGNOSIS — H548 Legal blindness, as defined in USA: Secondary | ICD-10-CM | POA: Diagnosis not present

## 2015-09-20 DIAGNOSIS — M810 Age-related osteoporosis without current pathological fracture: Secondary | ICD-10-CM | POA: Diagnosis not present

## 2015-09-20 DIAGNOSIS — S3210XD Unspecified fracture of sacrum, subsequent encounter for fracture with routine healing: Secondary | ICD-10-CM | POA: Diagnosis not present

## 2015-09-20 DIAGNOSIS — H905 Unspecified sensorineural hearing loss: Secondary | ICD-10-CM | POA: Diagnosis not present

## 2015-09-20 DIAGNOSIS — D8689 Sarcoidosis of other sites: Secondary | ICD-10-CM | POA: Diagnosis not present

## 2015-09-21 DIAGNOSIS — G629 Polyneuropathy, unspecified: Secondary | ICD-10-CM | POA: Diagnosis not present

## 2015-09-21 DIAGNOSIS — H905 Unspecified sensorineural hearing loss: Secondary | ICD-10-CM | POA: Diagnosis not present

## 2015-09-21 DIAGNOSIS — S3210XD Unspecified fracture of sacrum, subsequent encounter for fracture with routine healing: Secondary | ICD-10-CM | POA: Diagnosis not present

## 2015-09-21 DIAGNOSIS — H548 Legal blindness, as defined in USA: Secondary | ICD-10-CM | POA: Diagnosis not present

## 2015-09-21 DIAGNOSIS — D8689 Sarcoidosis of other sites: Secondary | ICD-10-CM | POA: Diagnosis not present

## 2015-09-21 DIAGNOSIS — M810 Age-related osteoporosis without current pathological fracture: Secondary | ICD-10-CM | POA: Diagnosis not present

## 2015-09-22 ENCOUNTER — Telehealth: Payer: Self-pay | Admitting: *Deleted

## 2015-09-22 DIAGNOSIS — N319 Neuromuscular dysfunction of bladder, unspecified: Secondary | ICD-10-CM

## 2015-09-22 DIAGNOSIS — S3422XS Injury of nerve root of sacral spine, sequela: Secondary | ICD-10-CM

## 2015-09-22 DIAGNOSIS — S3210XS Unspecified fracture of sacrum, sequela: Secondary | ICD-10-CM

## 2015-09-22 NOTE — Telephone Encounter (Signed)
[facility] OT called to say [facility] is ready for out pt rehab.  They have said to include pts phone number on referral to help outpt with the process. [phone]

## 2015-09-22 NOTE — Telephone Encounter (Signed)
May order outpatient PT and OT

## 2015-09-23 DIAGNOSIS — D8689 Sarcoidosis of other sites: Secondary | ICD-10-CM | POA: Diagnosis not present

## 2015-09-23 DIAGNOSIS — H548 Legal blindness, as defined in USA: Secondary | ICD-10-CM | POA: Diagnosis not present

## 2015-09-23 DIAGNOSIS — S3210XD Unspecified fracture of sacrum, subsequent encounter for fracture with routine healing: Secondary | ICD-10-CM | POA: Diagnosis not present

## 2015-09-23 DIAGNOSIS — H905 Unspecified sensorineural hearing loss: Secondary | ICD-10-CM | POA: Diagnosis not present

## 2015-09-23 DIAGNOSIS — M810 Age-related osteoporosis without current pathological fracture: Secondary | ICD-10-CM | POA: Diagnosis not present

## 2015-09-23 DIAGNOSIS — G629 Polyneuropathy, unspecified: Secondary | ICD-10-CM | POA: Diagnosis not present

## 2015-09-23 NOTE — Telephone Encounter (Signed)
Order placed

## 2015-09-29 DIAGNOSIS — N3020 Other chronic cystitis without hematuria: Secondary | ICD-10-CM | POA: Diagnosis not present

## 2015-09-29 DIAGNOSIS — N3942 Incontinence without sensory awareness: Secondary | ICD-10-CM | POA: Diagnosis not present

## 2015-09-29 DIAGNOSIS — N3944 Nocturnal enuresis: Secondary | ICD-10-CM | POA: Diagnosis not present

## 2015-09-29 DIAGNOSIS — N319 Neuromuscular dysfunction of bladder, unspecified: Secondary | ICD-10-CM | POA: Diagnosis not present

## 2015-10-12 ENCOUNTER — Ambulatory Visit: Payer: Medicare Other | Admitting: Occupational Therapy

## 2015-10-12 ENCOUNTER — Encounter: Payer: Self-pay | Admitting: Occupational Therapy

## 2015-10-12 ENCOUNTER — Encounter: Payer: Self-pay | Admitting: Physical Therapy

## 2015-10-12 ENCOUNTER — Ambulatory Visit: Payer: Medicare Other | Attending: Physical Medicine & Rehabilitation | Admitting: Physical Therapy

## 2015-10-12 DIAGNOSIS — R269 Unspecified abnormalities of gait and mobility: Secondary | ICD-10-CM | POA: Diagnosis not present

## 2015-10-12 DIAGNOSIS — Z7409 Other reduced mobility: Secondary | ICD-10-CM

## 2015-10-12 DIAGNOSIS — R201 Hypoesthesia of skin: Secondary | ICD-10-CM | POA: Insufficient documentation

## 2015-10-12 DIAGNOSIS — R531 Weakness: Secondary | ICD-10-CM | POA: Diagnosis not present

## 2015-10-12 DIAGNOSIS — R2681 Unsteadiness on feet: Secondary | ICD-10-CM | POA: Insufficient documentation

## 2015-10-12 DIAGNOSIS — R258 Other abnormal involuntary movements: Secondary | ICD-10-CM | POA: Diagnosis not present

## 2015-10-12 DIAGNOSIS — R252 Cramp and spasm: Secondary | ICD-10-CM

## 2015-10-12 NOTE — Therapy (Signed)
[name] [address] [place], [place], [address] Phone: [phone]   Fax:  [phone]  Occupational Therapy Evaluation  Patient Details  Name: Jamie [name] MRN: [id number] Date of Birth: [birth year] Referring Provider: Dr. Alysia Penna  Encounter Date: 10/12/2015      OT End of Session - 10/12/15 1530    Visit Number 1   Number of Visits 1   Authorization Type Medicare   OT Start Time 3235   OT Stop Time 1525   OT Time Calculation (min) 38 min   Activity Tolerance Patient tolerated treatment well      Past Medical History  Diagnosis Date  . 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 Neurosarcoidosis in adult [facility])     followed at [facility]  . 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 Progressive gait disorder     Was treated for probable MS until diagnosed with Neurosarcoidosis about 10 years ago.   . Thyroid disease   . Depression     History reviewed. No pertinent past surgical history.  There were no vitals filed for this visit.  Visit Diagnosis:  Impaired functional mobility, balance, and endurance      Subjective Assessment - 10/12/15 1454    Subjective  I do pretty well around my house.    Patient is accompained by: --  caregiver   Pertinent History see epic,  neurosarcoidisis  - pt unsure when but was diagnosed with neuro optic issues on 2003 and this is result of neurosarcoidosis   Patient Stated Goals to get more flexibility and get more mobile   Currently in Pain? Yes   Pain Score 6    Pain Location Sacrum   Pain Orientation Lower   Pain Descriptors / Indicators Burning   Pain Type Neuropathic pain   Pain Radiating Towards into left leg   Pain Onset More than a month ago   Pain Frequency Intermittent   Aggravating Factors  not sure, sitting on hard surfaces   Pain Relieving Factors standing, walking, sitting on donut cushion           [facility] OT Assessment -  10/12/15 1458    Assessment   Diagnosis sacral root nerve injury, sacral fx from fall    Referring Provider Dr. Alysia Penna   Onset Date --  May 12, 2015   Prior Therapy inpt rehab then [facility] PT and OT   Precautions   Precautions Fall   Restrictions   Weight Bearing Restrictions No   Balance Screen   Has the patient fallen in the past 6 months Yes   How many times? 1  pt has PT eval today   Home  Environment   Family/patient expects to be discharged to: Private residence   Living Arrangements Other(Comment)  friend who stays overnight   Available Help at Discharge Available 24 hours/day  pt has lifeline and lives in [place] Access Level entry   [place]   Additional Comments pt lives in handicapped apartment with grab rails in shower, shower seat and raised seat with handles around toilet   Prior Function   Level of Independence Needs assistance with homemaking  pt was independent otherwise   ADL   Eating/Feeding Modified independent   Grooming Modified independent   Upper Body Bathing Modified independent   Lower Body Bathing Modified independent   Upper Body Dressing  Independent   Lower Body Dressing Modified independent   Toilet Tranfer Modified independent  uses walker   Toileting - Clothing Manipulation Modified independent   [place]   Tub/Shower Transfer Supervision/safety  for balance only   IADL   Shopping Completely unable to shop   Light Housekeeping Does not participate in any housekeeping tasks   Meal Prep --  able to use microwave, stove top for simple heat up   Medication Management Takes responsibility if medication is prepared in advance in seperate dosage  father assists   Financial Management --  parents assist   Mobility   Mobility Status History of falls   Written Expression   Dominant Hand Right   Vision - History   Baseline  Vision --  pt is legally blind since before 2003   Activity Tolerance   Activity Tolerance Tolerate 30+ min activity without fatigue   Cognition   Overall Cognitive Status Within Functional Limits for tasks assessed   Sensation   Light Touch Appears Intact   Hot/Cold Appears Intact   Coordination   Gross Motor Movements are Fluid and Coordinated Yes   Fine Motor Movements are Fluid and Coordinated Yes   Tone   Assessment Location Right Upper Extremity;Left Upper Extremity   ROM / Strength   AROM / PROM / Strength AROM;Strength   AROM   Overall AROM  Within functional limits for tasks performed   Overall AROM Comments for UE"s   Strength   Overall Strength Within functional limits for tasks performed   Overall Strength Comments For UE's   Hand Function   Right Hand Gross Grasp Functional   Left Hand Gross Grasp Functional   RUE Tone   RUE Tone Within Functional Limits   LUE Tone   LUE Tone Mild;Hypertonic  mild with quick stretch to bicep                              OT Long Term Goals - Oct 19, 2015 1525    OT LONG TERM GOAL #1   Title N/A - pt has no OT needs at this time               Plan - 2015/10/19 1526    Clinical Impression Statement Pt is a 59 year old female s/p fall and sacral fx that resulted in sacral nerve injury. Pt primarily presents with decreased balance, LE weakness and pain in sacrum and buttocks. Pt lives in an an ILF and has 24 support if needed.  Pt has a friend stay overnight in her apartment with her however her friend does not actually assist pt.  Pt's friend stands by while pt enters shower  however then pt is alone all day. Pt is able to function indepenently within the structure of her current living environment and does not plan to leave this faciity.  Do not feel pt requires skilled OT at this time - pt to be seen by outpatient PT to address remaing deficits.  Pt in agreement  with plan.   Plan No further skilled OT  services required at this time.   Consulted and Agree with Plan of Care Patient          G-Codes - 10/19/15 1531    Functional Assessment Tool Used skilled clinical observation   Functional Limitation Self care   Self Care Current Status (F1638) At least 1 percent but less than 20  percent impaired, limited or restricted   Self Care Goal Status (D5329) At least 1 percent but less than 20 percent impaired, limited or restricted   Self Care Discharge Status [phone]) At least 1 percent but less than 20 percent impaired, limited or restricted      Problem List Patient Active Problem List   Diagnosis Date Noted  . 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 Neurogenic bowel 05/24/2015  . 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 Hypotonic neurogenic bladder   . Leg weakness   . Sacral fracture, closed ([place]) 05/21/2015  . Neurosarcoidosis ([place]) 05/21/2015    Quay Burow, OTR/L 10/12/2015, 3:32 PM  [facility] [address] [place], [place], [address] Phone: [phone]   Fax:  [phone]  Name: Jamie [name] MRN: [id number] Date of Birth: Aug 05, 1956

## 2015-10-12 NOTE — Therapy (Signed)
[facility] [address] [place], [place], [address] Phone: [phone]   Fax:  [phone]  Physical Therapy Evaluation  Patient Details  Name: Jamie [name] MRN: [id number] Date of Birth: [birth year] Referring Provider: Alysia Penna, MD  Encounter Date: 10/12/2015      PT End of Session - 10/12/15 1913    Visit Number 1   Number of Visits 17  eval + 16 visits   Date for PT Re-Evaluation 12/11/15   Authorization Type Medicare - GCodes   PT Start Time 0947   PT Stop Time 1445   PT Time Calculation (min) 49 min   Activity Tolerance Patient tolerated treatment well   Behavior During Therapy [facility] for tasks assessed/performed      Past Medical History  Diagnosis Date  . 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 Neurosarcoidosis in adult [facility])     followed at [facility]  . 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 Progressive gait disorder     Was treated for probable MS until diagnosed with Neurosarcoidosis about 10 years ago.   . Thyroid disease   . Depression     History reviewed. No pertinent past surgical history.  Filed Vitals:    Visit Diagnosis:  Abnormality of gait - Plan: PT plan of care cert/re-cert  Unsteadiness - Plan: PT plan of care cert/re-cert  Spasticity - Plan: PT plan of care cert/re-cert  Impaired sensation - Plan: PT plan of care cert/re-cert  Generalized weakness - Plan: PT plan of care cert/re-cert      Subjective Assessment - 10/12/15 1403    Subjective Pt sustained fall on 05/09/15 when moving to [place]. Hospitalized from 5/23 - 06/10/15 (including [place] inpatient rehab from 6/7 - 06/10/15). Pt D/C'ed from CIR to SNF [facility]), where she stayed for about a month before being discharged to [place]. Prior to fall/hospitalization, pt lived alone and was mod I with [facility] for mobility and was independent for self-care. Pt currently ambulates with rolling walker in home.  Pt has sustained 2 falls since DC home from SNF. Pt feels as though she is "much more stiff" now as compared with prior to hospitalization.    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 Yes   Pain Score 6   Lowest: 2/10; Highest: 8/10   Pain Location Sacrum   Pain Orientation Lower   Pain Descriptors / Indicators Burning   Pain Type Neuropathic pain   Pain Radiating Towards into left leg   Pain Onset More than a month ago   Pain Frequency Intermittent   Aggravating Factors  Not sure   Pain Relieving Factors Standing, walking; sitting on donut cushion   Effect of Pain on Daily Activities Pain will be monitored but not directly addressed in PT due to nature of sacral nerve injury.            [facility] PT Assessment - 10/12/15 0001    Assessment   Medical Diagnosis sacral fracture, sacral nerve root injury  history of neurosarcoidosis  Referring Provider Alysia Penna, MD   Onset Date/Surgical Date 05/09/15   Prior Therapy HHPT/OT   Precautions   Precautions Fall   Restrictions   Weight Bearing Restrictions No   Balance Screen   Has the patient fallen in the past 6 months Yes   How many times? 3   Has the patient had a decrease in activity level because of a fear of falling?  Yes   Is the patient reluctant to leave their home because of a fear of falling?  No   Home Environment   Living Environment Assisted living  ILF   Living Arrangements Alone  Neighbor/caregiver present for 9 am - 9 pm   Available Help at Discharge  Neighbor  and caregiver   Type of [place] One level   [place] - single point;Walker - 2 wheels;Toilet riser;Shower seat   Additional Comments Pt also has LifeAlert necklace.   Prior Function   Level of Independence Independent with basic ADLs;Requires assistive device for independence;Needs assistance with homemaking  had PCA for 3 hours/day to help with cooking/cleaning   Leisure Participates in activities, clubs at [employer]   Overall Cognitive Status Within Functional Limits for tasks assessed   Sensation   Light Touch Impaired by gross assessment   Additional Comments Decreased sensation in the sacral dermatomes of the feet    Coordination   Gross Motor Movements are Fluid and Coordinated No   Coordination and Movement Description Gross movement characterized by slow velocity, small amplitude movements   Posture/Postural Control   Posture/Postural Control Postural limitations   Postural Limitations Increased thoracic kyphosis;Posterior pelvic tilt;Flexed trunk   Tone   Assessment Location Right Lower Extremity;Left Lower Extremity   ROM / Strength   AROM / PROM / Strength Strength   Strength   Overall Strength Deficits   Overall Strength Comments Hip flexion 4-/5 on R, 3-/5 on L; B knee extension and B ankle DF grossly WFL; 4/5 R knee flexion; ankle PF 4/5 on R, 3+/5 on L.   Transfers   Transfers Sit to Stand;Stand to Sit   Sit to Stand 5: Supervision;4: Min assist   Sit to Stand Details (indicate cue type and reason) Min A witout AD. Using RW, pt requires supervision, cueing for safe hand placement   Stand to Sit 4: Min guard;4: Min assist   Ambulation/Gait   Ambulation/Gait Yes   Ambulation/Gait Assistance 4: Min guard   Ambulation Distance (Feet) 45 Feet   Assistive device Rolling walker   Gait Pattern Step-through pattern;Decreased stride length;Decreased hip/knee flexion - right;Decreased hip/knee flexion -  left;Decreased dorsiflexion - right;Decreased dorsiflexion - left;Right foot flat;Left foot flat;Right flexed knee in stance;Left flexed knee in stance;Decreased trunk rotation;Trunk flexed;Narrow base of support   Ambulation Surface Level;Indoor   Gait velocity .21 ft/sec   Balance   Balance Assessed Yes   Static Standing Balance   Static Standing - Balance Support Bilateral upper extremity supported   Static Standing - Level of Assistance 5: Stand by assistance   RLE Tone   RLE Tone Modified [place]   RLE Tone   Modified [place] Scale for Grading Hypertonia RLE More marked increase in muscle tone through most of the ROM, but affected part(s) easily moved  R quadriceps   LLE Tone   LLE Tone Modified [place]   LLE Tone   Modified [place] Scale for Grading Hypertonia LLE Slight increase in muscle tone,  manifested by a catch, followed by minimal resistance throughout the remainder (less than half) of the ROM  L quadriceps                           PT Education - 10/12/15 1854    Education provided Yes   Education Details PT eval findings, goals, and POC.   Person(s) Educated Patient;Caregiver(s)   Methods Explanation   Comprehension Verbalized understanding          PT Short Term Goals - 10/12/15 1943    PT SHORT TERM GOAL #1   Title Pt will independently perform HEP to maximize functional gains made in PT. Target date: 11/09/15   PT SHORT TERM GOAL #2   Title Complete Berg and improve score by 4 points from baseline to indicate improved functional standing balance. Target date: 11/09/15   PT SHORT TERM GOAL #3   Title Pt will perform sit to/from stand x5 reps with supervision with no AD and minimal UE use to indicate improved functional LE strength. Target date: 11/09/15   PT SHORT TERM GOAL #4   Title Pt will improve gait velocity from .21 ft/sec to .51 ft/sec to indicate increased efficiency of ambulation. Target date: 11/09/15   PT SHORT TERM GOAL #5    Title Pt will ambulate 100' consecutively with no rest breaks using LRAD to indicate significant improvement in activity tolerance. Target date: 11/09/15   Baseline On evaluation, pt ambulated x45' with 2 standing rest breaks using RW           PT Long Term Goals - 10/12/15 1956    PT LONG TERM GOAL #1   Title Pt will verbalize understanding of fall prevention strategies to decrease fall risk within home environment. Target date: 12/07/15   PT LONG TERM GOAL #2   Title Pt will increase gait velocity from .21 ft/sec to .81 ft/sec to indicate increased efficiency of ambulation. Target date: 12/07/15   PT LONG TERM GOAL #3   Title Pt will improve Berg score by 8 points from baseline to indicate significant improvement in functional standing balance. Target date: 12/07/15   PT LONG TERM GOAL #4   Title Pt will ambulate 150' over level surfaces with mod I using LRAD to indicate increased independence with household mobility, progress toward PLOF. Target date: 12/07/15   PT LONG TERM GOAL #5   Title Pt will ambulate 200' over unlevel, paved surfaces with supervision using LRAD to enable pt to safely walk from car to/from doctor's office with caregiver. Target date: 12/07/15   Additional Long Term Goals   Additional Long Term Goals Yes   PT LONG TERM GOAL #6   Title Pt will perform all above aspects of mobility < / = 2-point increase in within-session pain rating to indicate decreased impact of pain on functional activity tolerance. Target date: 12/07/15               Plan - 10/12/15 1916    Clinical Impression Statement Pt is a 59 y/o F with PMH significant for neurosarcoidosis referred to outpatient PT to address functional limitations associated with sacral fracture and sacral nerve injury when pt sustained fall on 05/09/15. Pt hospitalized from 5/23 - 06/10/15 (including inpatient rehab from 6/7 - 06/10/15). Prior to hospitalization, pt was ambulating with mod I using standard cane. PT  evaluation reveals the following impairments: BLE weakness, impaired sensation in B LE's, spasticity, balance deficits, abnormal posture and  gait, decreased stability/independence with functional transfers, and gait velocity indicative of risk of recurrent falls. Pt will benefit from skilled outpatient PT 2x/week for 8 weeks to address said impairments.   Pt will benefit from skilled therapeutic intervention in order to improve on the following deficits Abnormal gait;Decreased endurance;Decreased coordination;Decreased balance;Decreased strength;Decreased mobility;Impaired tone;Postural dysfunction;Other (comment);Pain;Impaired sensation;Impaired flexibility;Decreased activity tolerance  Pain will be monitored but not directly addressed by PT due to nature of pain.   Rehab Potential Good   Clinical Impairments Affecting Rehab Potential legal blindness   PT Frequency 2x / week   PT Duration 8 weeks   PT Treatment/Interventions Gait training;Functional mobility training;Therapeutic activities;Therapeutic exercise;Balance training;Patient/family education;Neuromuscular re-education;Manual techniques;Vestibular;DME Instruction   PT Next Visit Plan Initiate HEP   Consulted and Agree with Plan of Care Patient;Other (Comment)  Caregiver, Lizabeth Leyden - 11-01-2015 1906    Functional Assessment Tool Used gait velocity .21 ft/sec   Functional Limitation Mobility: Walking and moving around   Mobility: Walking and Moving Around Current Status [phone]) At least 80 percent but less than 100 percent impaired, limited or restricted   Mobility: Walking and Moving Around Goal Status [phone]) At least 60 percent but less than 80 percent impaired, limited or restricted       Problem List Patient Active Problem List   Diagnosis Date Noted  . 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 Neurogenic bowel 05/24/2015  . 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 Hypotonic neurogenic bladder   . Leg weakness    . Sacral fracture, closed ([place]) 05/21/2015  . Neurosarcoidosis ([place]) 05/21/2015   Billie Ruddy, PT, [facility] [address] [place], [place], [address] Phone: [phone]   Fax:  [phone] 11-01-15, 8:10 PM   Name: Zalia Hautala MRN: [id number] Date of Birth: 04/22/1956

## 2015-10-16 IMAGING — DX DG CHEST 2V
2 series · 2 of 2 positions shown · non-contrast
Comparison: None.

CLINICAL DATA: Routine checkup.

EXAM:
CHEST  2 VIEW

[chest pa]
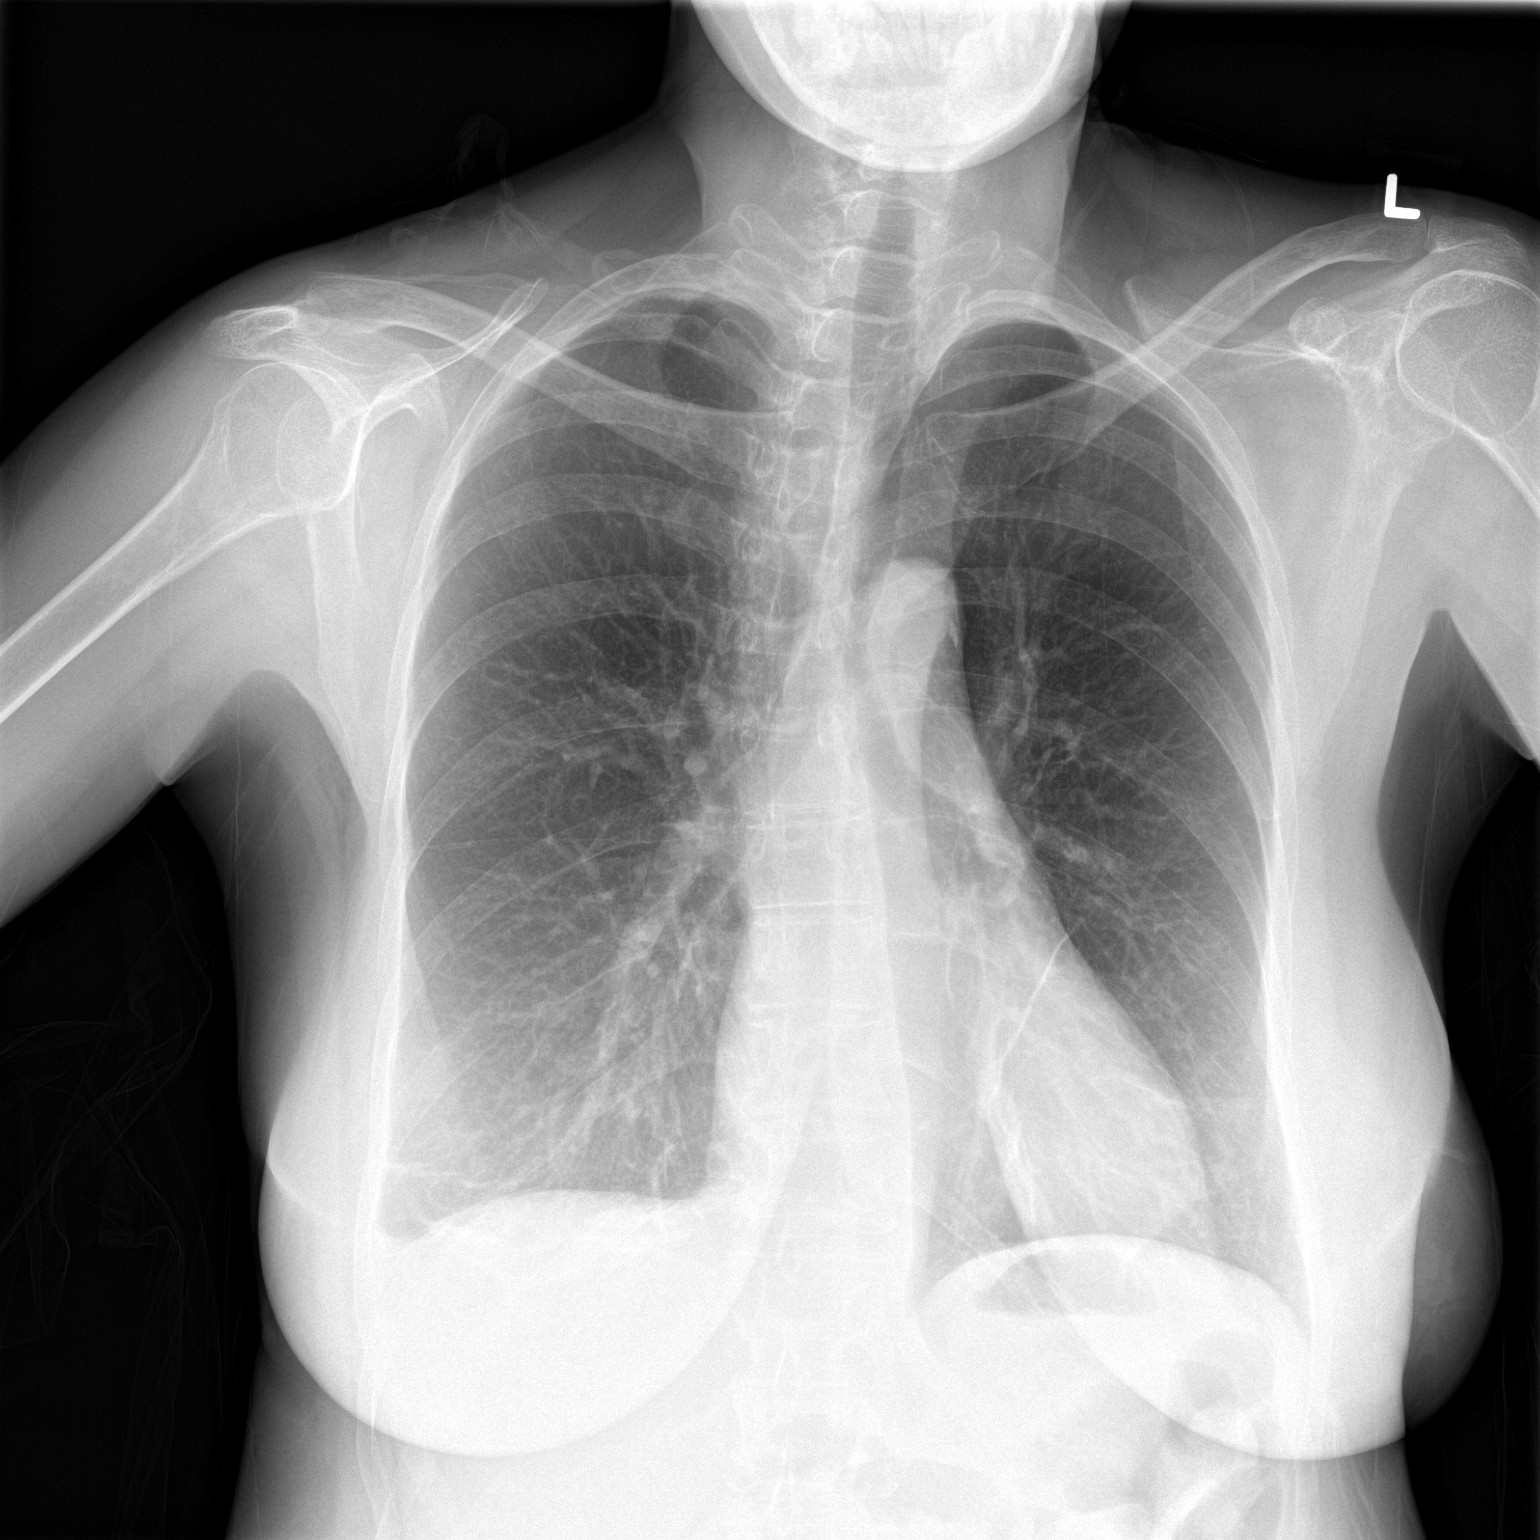

[chest lat]
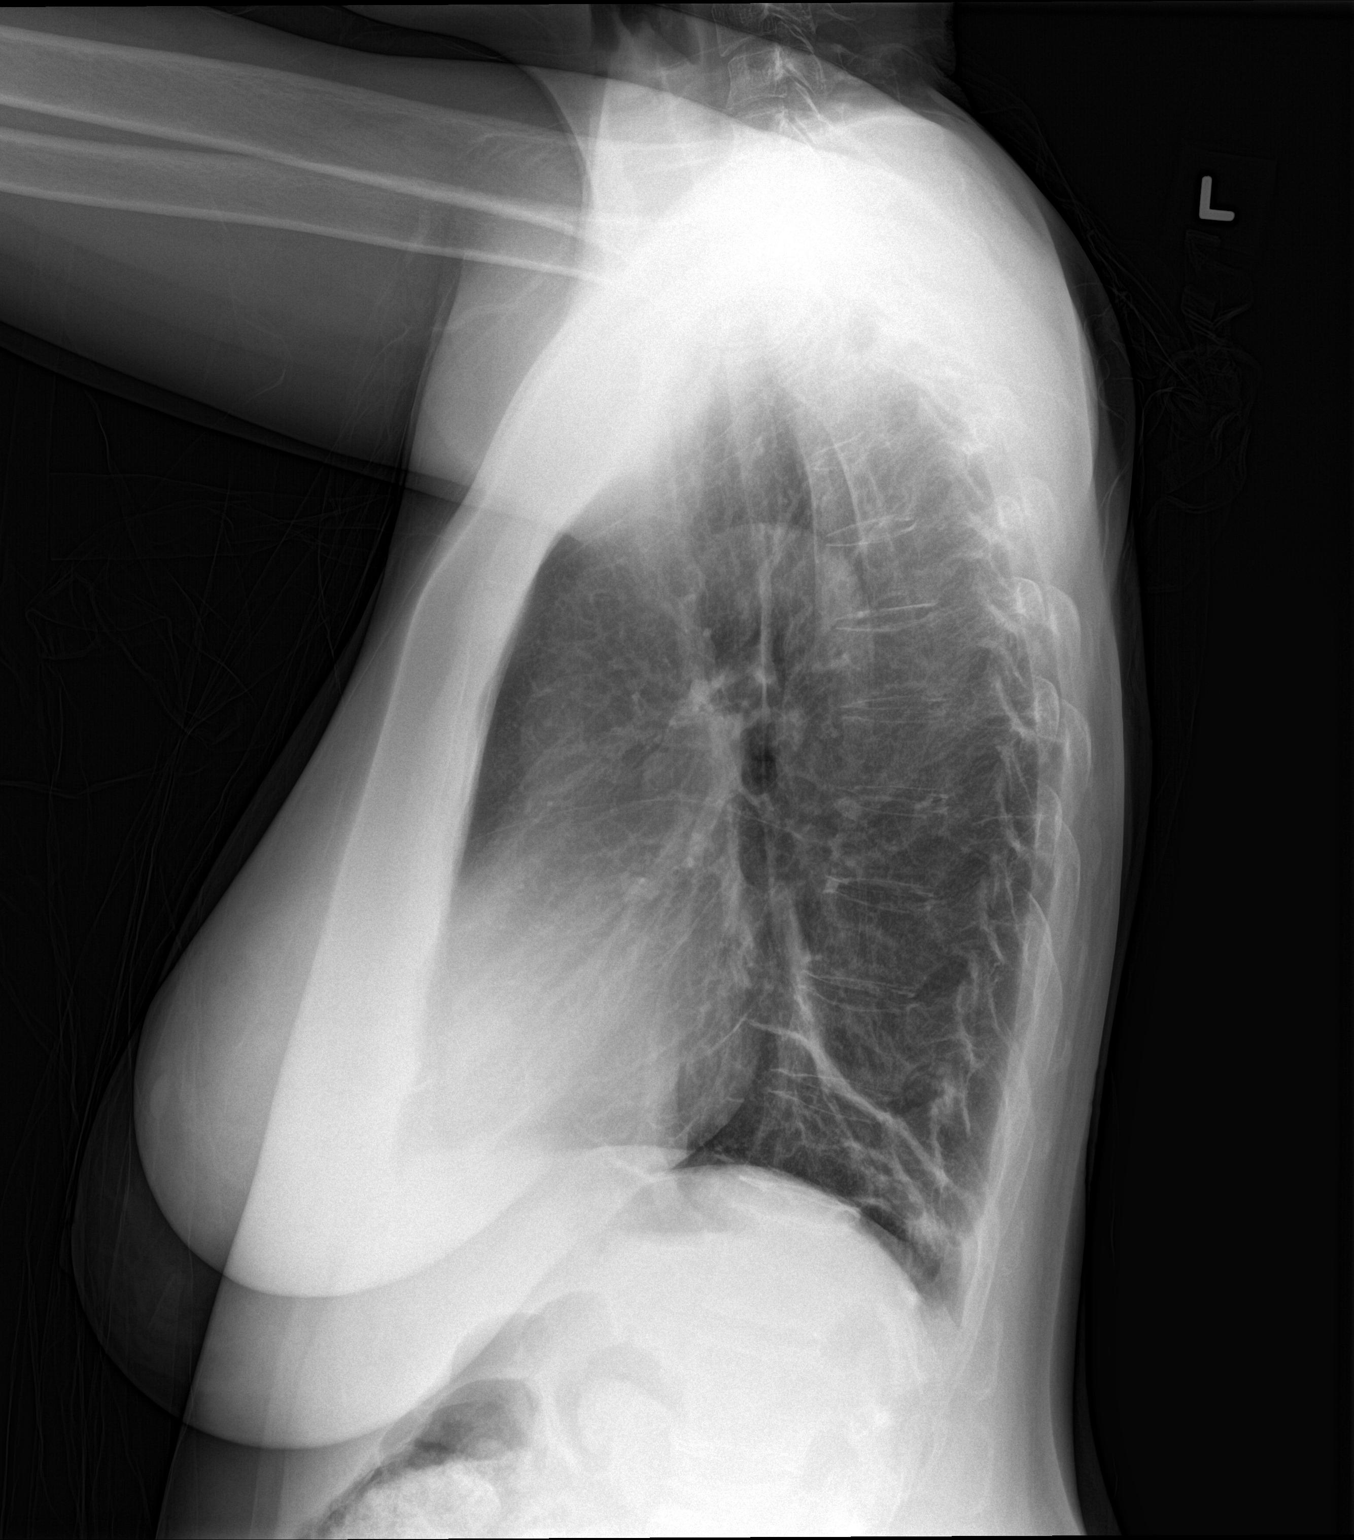

[2 of 2 positions shown; findings below may reference images not displayed]

FINDINGS: The heart size and mediastinal contours are within normal limits. No
pneumothorax or pleural effusion is noted. Minimal subsegmental
atelectasis or scarring is noted laterally in right lung base. Mild
subsegmental atelectasis or scarring is noted posteriorly in the
left lower lobe. The visualized skeletal structures are
unremarkable.
IMPRESSION: Bibasilar subsegmental atelectasis or scarring is noted, with left
greater than right.

## 2015-10-19 DIAGNOSIS — N3944 Nocturnal enuresis: Secondary | ICD-10-CM | POA: Diagnosis not present

## 2015-10-19 DIAGNOSIS — N3942 Incontinence without sensory awareness: Secondary | ICD-10-CM | POA: Diagnosis not present

## 2015-10-20 ENCOUNTER — Ambulatory Visit: Payer: Medicare Other | Attending: Physical Medicine & Rehabilitation | Admitting: Physical Therapy

## 2015-10-20 DIAGNOSIS — R2681 Unsteadiness on feet: Secondary | ICD-10-CM | POA: Diagnosis not present

## 2015-10-20 DIAGNOSIS — R531 Weakness: Secondary | ICD-10-CM | POA: Diagnosis not present

## 2015-10-20 DIAGNOSIS — R269 Unspecified abnormalities of gait and mobility: Secondary | ICD-10-CM | POA: Diagnosis not present

## 2015-10-20 DIAGNOSIS — Z7409 Other reduced mobility: Secondary | ICD-10-CM | POA: Insufficient documentation

## 2015-10-20 NOTE — Patient Instructions (Signed)
Weight Shift: Lateral (Righting / Equilibrium)  (SHIFTING YOUR WEIGHT FROM SIDE TO SIDE)    Stand facing the counter.  Feet shoulder width apart, slowly shift weight over right leg.  Return to starting position. Shift weight over left leg.  Repeat __10-15__ times per session. Do __1-2__ sessions per day. Copyright  VHI. All rights reserved.    Single Step: Forward / Backward    Stand beside counter with chair or other counter to hold (both hands supported).  Lifting foot off floor, take one step slowly forward with right leg. Return to starting position. Repeat with other leg, alternating legs _10___ times per session. Do __1-2__ sessions per day. Repeat with other leg.  Copyright  VHI. All rights reserved.

## 2015-10-21 ENCOUNTER — Ambulatory Visit: Payer: Medicare Other | Admitting: Physical Therapy

## 2015-10-21 DIAGNOSIS — R531 Weakness: Secondary | ICD-10-CM

## 2015-10-21 DIAGNOSIS — R269 Unspecified abnormalities of gait and mobility: Secondary | ICD-10-CM | POA: Diagnosis not present

## 2015-10-21 DIAGNOSIS — R2681 Unsteadiness on feet: Secondary | ICD-10-CM

## 2015-10-21 DIAGNOSIS — Z7409 Other reduced mobility: Secondary | ICD-10-CM | POA: Diagnosis not present

## 2015-10-21 NOTE — Patient Instructions (Addendum)
Weight Shift: Lateral (Righting / Equilibrium) (SHIFTING YOUR WEIGHT FROM SIDE TO SIDE)    Stand facing the counter. Feet shoulder width apart, slowly shift weight over right leg. Return to starting position. Shift weight over left leg. Repeat __10-15__ times per session. Do __1-2__ sessions per day.   Forward/backward Rocking in Stagger Standing position    Stand in your kitchen galley area, where you can reach a stable counter on both sides. Stagger feet. Shift weight from front foot to back foot.  When shifting to front foot, come up on the toes of your back foot. Perform 10 repetitions; then switch your feet.  Perform 10 reps, 2 times per day on each leg.   Single Step: Forward / Backward    Stand beside counter with chair or other counter to hold (both hands supported). Lifting foot off floor, take one step slowly forward with right leg. Return to starting position. Repeat with other leg, alternating legs _10___ times per session. Do __1-2__ sessions per day. Repeat with other leg.  Standing Rotation    Stand facing kitchen countertop. With left hand supported on counter, reach across body with right hand; follow right hand with your head. Perform 10 reps to right; then 10 reps to the left.  Do this exercise 1-2 times per day.

## 2015-10-21 NOTE — Therapy (Signed)
[facility] [address] [place], [place], [address] Phone: [phone]   Fax:  [phone]  Physical Therapy Treatment  Patient Details  Name: Jamie [name] MRN: [id number] Date of Birth: [birth year] Referring Provider: Alysia Penna, MD  Encounter Date: 10/21/2015      PT End of Session - 10/21/15 1103    Visit Number 3   Number of Visits 17   Date for PT Re-Evaluation 12/11/15   Authorization Type Medicare - GCodes   PT Start Time 0935  Pt arrived late for session   PT Stop Time 1019   PT Time Calculation (min) 44 min   Activity Tolerance Patient tolerated treatment well   Behavior During Therapy [facility] for tasks assessed/performed      Past Medical History  Diagnosis Date  . 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)   . 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 Progressive gait disorder     Was treated for probable MS until diagnosed with Neurosarcoidosis about 10 years ago.   . Thyroid disease   . Depression     No past surgical history on file.  There were no vitals filed for this visit.  Visit Diagnosis:  Abnormality of gait  Unsteadiness  Generalized weakness      Subjective Assessment - 10/21/15 0947    Subjective Pt denies falls. Reports no significant changes since yesterday's PT visit. Pt practiced HEP at home last night with caregiver assist (reading handout). 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 Yes   Pain Score 4    Pain Location Sacrum   Pain Orientation Lower   Pain Descriptors / Indicators Burning   Pain Type Neuropathic pain   Pain Onset More than a month ago   Pain Frequency Intermittent   Aggravating Factors  sitting on hard surfaces   Pain Relieving Factors nothing alleviates   Effect of Pain on Daily Activities Pain will be monitored but not directly addressed in PT due to nature of sacral nerve injury.                         [place] Adult PT Treatment/Exercise - 10/21/15 0001    Transfers   Transfers Sit to Stand;Stand to Sit   Sit to Stand 5: Supervision;4: Min assist   Stand to Sit 5: Supervision   Ambulation/Gait   Ambulation/Gait Yes   Ambulation/Gait Assistance 4: Min guard   Ambulation Distance (Feet) 120 Feet  then x45' and x70'   Assistive device Rolling walker   Gait Pattern Step-through pattern;Decreased stride length;Decreased hip/knee flexion - right;Decreased hip/knee flexion - left;Decreased dorsiflexion - right;Decreased dorsiflexion - left;Right foot flat;Left foot flat;Right flexed knee in stance;Left flexed knee in stance;Decreased trunk rotation;Trunk flexed;Narrow base of support   Ambulation Surface Level;Indoor   Gait velocity --   Posture/Postural Control   Posture/Postural Control Postural limitations   Postural Limitations Increased thoracic kyphosis;Posterior pelvic tilt;Flexed  trunk   Balance   Balance Assessed Yes   Static Standing Balance   Static Standing - Balance Support No upper extremity supported   Static Standing - Level of Assistance 5: Stand by assistance;4: Min assist  significant A/P postural sway   Static Standing - Comment/# of Minutes 15   Exercises   Exercises Other Exercises   Other Exercises  Standing at countertop, pt performed exercises provided in prior session (lateral weight shifting and forward stepping with BUE support); pt required  supervision and min cueing for technique. Added A/P rocking in stagger standing with BUE support and standing trunk rotation with single UE support (modifiied PWR! Twist) for increased axial mobility. Attempted to modify PWR! Up for improved postural alignment; however, unable to find safe.effective technique with BUE support. See Pt Instructions for details on all exercises, reps, and sets.                PT Education - 10/21/15 1100    Education provided Yes   Education Details Standing HEP at countertop; see Pt Instructions. Instructed pt to perform with supervision of caregiver.   Person(s) Educated Patient;Caregiver(s)   Methods Explanation;Demonstration;Verbal cues;Handout;Tactile cues   Comprehension Returned demonstration;Verbalized understanding          PT Short Term Goals - 10/21/15 0951    PT SHORT TERM GOAL #1   Title Pt will independently perform HEP to maximize functional gains made in PT. Target date: 11/09/15   PT SHORT TERM GOAL #2   Title Complete Berg and improve score by 4 points from baseline to indicate improved functional standing balance. Target date: 11/09/15   Baseline Berg not appropriate due pt inability to stand statically without UE support   Status Deferred   PT SHORT TERM GOAL #3   Title Pt will perform sit to/from stand x5 reps with supervision with no AD and minimal UE use to indicate improved functional LE strength. Target date: 11/09/15   PT SHORT TERM GOAL #4   Title Pt will improve gait velocity from .21 ft/sec to .51 ft/sec to indicate increased efficiency of ambulation. Target date: 11/09/15   PT SHORT TERM GOAL #5   Title Pt will ambulate 100' consecutively with no rest breaks using LRAD to indicate significant improvement in activity tolerance. Target date: 11/09/15   Baseline On evaluation, pt ambulated x45' with 2 standing rest breaks using RW           PT Long Term Goals - 10/21/15 1275    PT LONG TERM GOAL #1   Title Pt will  verbalize understanding of fall prevention strategies to decrease fall risk within home environment. Target date: 12/07/15   PT LONG TERM GOAL #2   Title Pt will increase gait velocity from .21 ft/sec to .81 ft/sec to indicate increased efficiency of ambulation. Target date: 12/07/15   PT LONG TERM GOAL #3   Title Pt will improve Berg score by 8 points from baseline to indicate significant improvement in functional standing balance. Target date: 12/07/15   Baseline Berg not appropriate due to pt inability to stand statically without UE support   Status Deferred   PT LONG TERM GOAL #4   Title Pt will ambulate 150' over level surfaces with mod I using LRAD to indicate increased independence with household mobility, progress toward PLOF. Target date: 12/07/15   PT LONG TERM GOAL #5   Title Pt will ambulate 200' over unlevel, paved surfaces with supervision using LRAD to enable pt to  safely walk from car to/from doctor's office with caregiver. Target date: 12/07/15   PT LONG TERM GOAL #6   Title Pt will perform all above aspects of mobility < / = 2-point increase in within-session pain rating to indicate decreased impact of pain on functional activity tolerance. Target date: 12/07/15               Plan - 10/21/15 1105    Clinical Impression Statement Session focused on increasing axial mobility, promoting larger amplitude movements to promote more stable and efficient gait pattern. Pt tolerated all interventions well; rest breaks taken in standing due to decreased pt comfort with sitting. Deferred both short and long term goals for Berg, as pt unable to perform static standing wihout UE support.    Pt will benefit from skilled therapeutic intervention in order to improve on the following deficits Abnormal gait;Decreased endurance;Decreased coordination;Decreased balance;Decreased strength;Decreased mobility;Impaired tone;Postural dysfunction;Other (comment);Pain;Impaired sensation;Impaired  flexibility;Decreased activity tolerance  Pain will be monitored but not directly addressed by PT.   Clinical Impairments Affecting Rehab Potential legal blindness   PT Frequency 2x / week   PT Duration 8 weeks   PT Treatment/Interventions Gait training;Functional mobility training;Therapeutic activities;Therapeutic exercise;Balance training;Patient/family education;Neuromuscular re-education;Manual techniques;Vestibular;DME Instruction   PT Next Visit Plan Ensure safe/effective HEP performance. Consider adding static standing without UE support.   Consulted and Agree with Plan of Care Patient;Other (Comment)  caregiver, Jamie [name]        Problem List Patient Active Problem List   Diagnosis Date Noted  . 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 Neurogenic bowel 05/24/2015  . 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 Hypotonic neurogenic bladder   . Leg weakness   . Sacral fracture, closed ([place]) 05/21/2015  . Neurosarcoidosis ([place]) 05/21/2015    Billie Ruddy, PT, [facility] [address] [place], [place], [address] Phone: [phone]   Fax:  [phone] 10/21/2015, 11:10 AM  Name: Jamie [name] MRN: [id number] Date of Birth: 03-03-1956

## 2015-10-21 NOTE — Therapy (Signed)
[facility] [address] [place], [place], [address] Phone: [phone]   Fax:  [phone]  Physical Therapy Treatment  Patient Details  Name: Jamie [name] MRN: [id number] Date of Birth: 03-Apr-1956 Referring Provider: Alysia Penna, MD  Encounter Date: 10/20/2015    10/20/15 1159  PT Visits / Re-Eval  Visit Number 2  Number of Visits 17  Date for PT Re-Evaluation 12/11/15  Authorization  Authorization Type Medicare - GCodes  PT Time Calculation  PT Start Time 0939  PT Stop Time 1025  PT Time Calculation (min) 46 min  PT - End of Session  Activity Tolerance Patient tolerated treatment well  Behavior During Therapy [facility] for tasks assessed/performed    Past Medical History  Diagnosis Date  . 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 Neurosarcoidosis in adult [facility])     followed at [facility]  . 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 Progressive gait disorder     Was treated for probable MS until diagnosed with Neurosarcoidosis about 10 years ago.   . Thyroid disease   . Depression     No past surgical history on file.  There were no vitals filed for this visit.  Visit Diagnosis:  Impaired functional mobility, balance, and endurance  Unsteadiness  Abnormality of gait    10/20/15 0948  Symptoms/Limitations  Subjective Nothing new since last visit.  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 Yes  Pain Score 6  Pain Location Sacrum  Pain Orientation Lower  Aggravating Factors  sitting on hard  surfaces; riding on the bus  Pain Relieving Factors nothing alleviates too much        Gait:  Gait training 110 ft, using RW with min guard assistance, with verbal cues for increased step length and improved foot clearance.  Pt needs 2 standing rest breaks during gait.  At end of session, pt ambulates 100 ft. Using RW with min guard assistance, with tactile cues for lateral weigthshifting for improved step length.  Therapeutic Exercise: -Attempted bridging supine on mat, but pt declines wanting to do this due to sacral pain. -Attempted postural exercises in supine including upright posture x 10 reps, lateral weightshifting with reaching x 10 reps, seated marching x 10, but pt reports she will likely not do these due to increased sacral pain with prolonged sitting on hard surfaces.  -Standing exercises at walker with therapist stabilizing walker:  Postural exercise with forward lean onto walker, then stand with best upright posture x 10 reps (pt continues to hold onto walker with bilateral UEs.) -Standing lateral weigthshifting x 10 reps with bilateral UE support. -Standing marching x 10 reps -Standing forward step and weightshift x 10 reps with bilateral UE support.  Added standing lateral weightshifting and forward step/weightshift to HEP. -Discussed how these exercises can help with standing weightshifting and with gait.                       PT Education - 10/21/15 1158    Education provided Yes   Education Details Standing HEP-see instructions:  lateral weightshifting, forward step and weightshift  Person(s) Educated Patient;Caregiver(s)   Methods Explanation;Demonstration;Verbal cues;Handout   Comprehension Verbalized understanding;Returned demonstration;Need further instruction          PT Short Term Goals - 10/21/15 0951    PT SHORT TERM GOAL #1   Title Pt will independently perform HEP to maximize functional gains made in PT. Target date: 11/09/15   PT  SHORT TERM GOAL #2   Title Complete Berg and improve score by 4 points from baseline to indicate improved functional standing balance. Target date: 11/09/15   Baseline Berg not appropriate due pt inability to stand statically without UE support   Status Deferred   PT SHORT TERM GOAL #3   Title Pt will perform sit to/from stand x5 reps with supervision with no AD and minimal UE use to indicate improved functional LE strength. Target date: 11/09/15   PT SHORT TERM GOAL #4   Title Pt will improve gait velocity from .21 ft/sec to .51 ft/sec to indicate increased efficiency of ambulation. Target date: 11/09/15   PT SHORT TERM GOAL #5   Title Pt will ambulate 100' consecutively with no rest breaks using LRAD to indicate significant improvement in activity tolerance. Target date: 11/09/15   Baseline On evaluation, pt ambulated x45' with 2 standing rest breaks using RW           PT Long Term Goals - 10/21/15 4098    PT LONG TERM GOAL #1   Title Pt will verbalize understanding of fall prevention strategies to decrease fall risk within home environment. Target date: 12/07/15   PT LONG TERM GOAL #2   Title Pt will increase gait velocity from .21 ft/sec to .81 ft/sec to indicate increased efficiency of ambulation. Target date: 12/07/15   PT LONG TERM GOAL #3   Title Pt will improve Berg score by 8 points from baseline to indicate significant improvement in functional standing balance. Target date: 12/07/15   Baseline Berg not appropriate due to pt inability to stand statically without UE support   Status Deferred   PT LONG TERM GOAL #4   Title Pt will ambulate 150' over level surfaces with mod I using LRAD to indicate increased independence with household mobility, progress toward PLOF. Target date: 12/07/15   PT LONG TERM GOAL #5   Title Pt will ambulate 200' over unlevel, paved surfaces with supervision using LRAD to enable pt to safely walk from car to/from doctor's office with caregiver. Target  date: 12/07/15   PT LONG TERM GOAL #6   Title Pt will perform all above aspects of mobility < / = 2-point increase in within-session pain rating to indicate decreased impact of pain on functional activity tolerance. Target date: 12/07/15           10/20/15 1202  Plan  Clinical Impression Statement Attempted supine and strengthening exercises today, but pt does not want to stay in these positions long due to sacral discomfort.  Intiated HEP today with focus on weightshift and stepping activities.  Pt reports feeling that this was a good session at end of therapy visit today.  Pt will benefit from skilled therapeutic intervention in order to improve on the following deficits Abnormal gait;Decreased endurance;Decreased coordination;Decreased balance;Decreased strength;Decreased mobility;Impaired tone;Postural dysfunction;Other (comment);Pain;Impaired sensation;Impaired flexibility;Decreased activity tolerance (Pain will be monitored but not directly addressed by PT.)  Clinical Impairments Affecting Rehab Potential legal blindness  PT Frequency 2x / week  PT Duration 8 weeks  PT Treatment/Interventions Gait training;Functional mobility training;Therapeutic activities;Therapeutic exercise;Balance training;Patient/family education;Neuromuscular re-education;Manual techniques;Vestibular;DME Instruction  PT Next Visit Plan Review HEP, add postural exercises in standing, lateral and possibly posterior step and weigthshift; add ant/posterior stagger stance weigthshfting  Consulted and Agree with Plan of Care Patient;Other (Comment) (caregiver, Beverlee Nims)         Problem List Patient Active Problem List   Diagnosis Date Noted  . 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 Neurogenic bowel 05/24/2015  . 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 Hypotonic neurogenic bladder   . Leg weakness   . Sacral fracture, closed ([place]) 05/21/2015  . Neurosarcoidosis ([place]) 05/21/2015    MARRIOTT,AMY  W. 10/21/2015, 12:07 PM  Frazier Butt., PT  [facility] [address] [place], [place], [address] Phone: [phone]   Fax:  [phone]  Name: Jamie [name] MRN: [id number] Date of Birth: 03/17/1956

## 2015-10-24 ENCOUNTER — Ambulatory Visit: Payer: Medicare Other | Admitting: Physical Therapy

## 2015-10-24 DIAGNOSIS — R531 Weakness: Secondary | ICD-10-CM

## 2015-10-24 DIAGNOSIS — R2681 Unsteadiness on feet: Secondary | ICD-10-CM | POA: Diagnosis not present

## 2015-10-24 DIAGNOSIS — R269 Unspecified abnormalities of gait and mobility: Secondary | ICD-10-CM | POA: Diagnosis not present

## 2015-10-24 DIAGNOSIS — Z7409 Other reduced mobility: Secondary | ICD-10-CM | POA: Diagnosis not present

## 2015-10-24 NOTE — Therapy (Signed)
[facility] [address] [place], [place], [address] Phone: [phone]   Fax:  [phone]  Physical Therapy Treatment  Patient Details  Name: Jamie [name] MRN: [id number] Date of Birth: 11-29-1956 Referring Provider: Alysia Penna, MD  Encounter Date: 10/24/2015      PT End of Session - 10/24/15 2045    Visit Number 4   Number of Visits 17   Date for PT Re-Evaluation 12/11/15   Authorization Type Medicare - GCodes   PT Start Time 1020   PT Stop Time 1100   PT Time Calculation (min) 40 min   Activity Tolerance Patient tolerated treatment well   Behavior During Therapy [facility] for tasks assessed/performed      Past Medical History  Diagnosis Date  . 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 Progressive gait disorder     Was treated for probable MS until diagnosed with Neurosarcoidosis about 10 years ago.   . Thyroid disease   . Depression     No past surgical history on file.  There were no vitals filed for this visit.  Visit Diagnosis:  Abnormality of gait  Unsteadiness  Generalized weakness                       [facility] Adult PT Treatment/Exercise - 10/24/15 0001    Transfers   Transfers Sit to Stand;Stand to Sit   Sit to Stand 5: Supervision;4: Min assist   Stand to Sit 5: Supervision   Ambulation/Gait   Ambulation/Gait Yes   Ambulation/Gait Assistance 5: Supervision   Ambulation/Gait Assistance Details Cueing for upright posture,  increased stride length.   Ambulation Distance (Feet) 260 Feet  2 x90', 30', and 50'   Assistive device Rolling walker   Gait Pattern Step-through pattern;Decreased stride length;Decreased hip/knee flexion - right;Decreased hip/knee flexion - left;Decreased dorsiflexion - right;Decreased dorsiflexion - left;Right foot flat;Left foot  flat;Right flexed knee in stance;Left flexed knee in stance;Decreased trunk rotation;Trunk flexed;Narrow base of support   Ambulation Surface Level;Indoor   Gait velocity .27 ft.sec   Posture/Postural Control   Posture/Postural Control Postural limitations   Postural Limitations Increased thoracic kyphosis;Posterior pelvic tilt;Flexed trunk   Balance   Balance Assessed --   Static Standing Balance   Static Standing - Balance Support --   Static Standing - Level of Assistance --  significant A/P postural sway   Exercises   Exercises Other Exercises   Other Exercises  Pt performed all home exercises provided in prior session with 50% cueing to pt inability to see paper handout; also required verbal/tactile cueing  from this PT (caregiver not present) for proper technique. Explained, demonstrated, and provided paper handout for corner balance exercise in which pt performs static standing 4 x30-sec holds. See Pt Instructions for details on all exercises, reps, and sets.                PT Education - 10/24/15 2045    Education provided Yes   Education Details HEP: added corner balance exercise. Explained importance of caregiver being present for PT session to promote carryover of proper technique (due to pt's visual impairments).   Person(s) Educated Patient;Caregiver(s)   Methods Explanation;Demonstration;Tactile cues;Verbal cues;Handout   Comprehension Verbalized understanding;Verbal cues required;Tactile cues required;Need further instruction          PT Short  Term Goals - 10/24/15 2039    PT SHORT TERM GOAL #1   Title Pt will perform HEP with min A of caregiver to maximize functional gains made in PT. Target date: 11/09/15   Baseline Revised from independent to min A of caregiver due to visual impairments, inconsistent carryover.   Status Revised   PT SHORT TERM GOAL #2   Title Complete Berg and improve score by 4 points from baseline to indicate improved functional standing  balance. Target date: 11/09/15   Baseline Berg not appropriate due pt inability to stand statically without UE support   Status Deferred   PT SHORT TERM GOAL #3   Title Pt will perform sit to/from stand x5 reps with supervision with no AD and minimal UE use to indicate improved functional LE strength. Target date: 11/09/15   PT SHORT TERM GOAL #4   Title Pt will improve gait velocity from .21 ft/sec to .51 ft/sec to indicate increased efficiency of ambulation. Target date: 11/09/15   PT SHORT TERM GOAL #5   Title Pt will ambulate 100' consecutively with no rest breaks using LRAD to indicate significant improvement in activity tolerance. Target date: 11/09/15   Baseline On evaluation, pt ambulated x45' with 2 standing rest breaks using RW           PT Long Term Goals - 10/21/15 3976    PT LONG TERM GOAL #1   Title Pt will verbalize understanding of fall prevention strategies to decrease fall risk within home environment. Target date: 12/07/15   PT LONG TERM GOAL #2   Title Pt will increase gait velocity from .21 ft/sec to .81 ft/sec to indicate increased efficiency of ambulation. Target date: 12/07/15   PT LONG TERM GOAL #3   Title Pt will improve Berg score by 8 points from baseline to indicate significant improvement in functional standing balance. Target date: 12/07/15   Baseline Berg not appropriate due to pt inability to stand statically without UE support   Status Deferred   PT LONG TERM GOAL #4   Title Pt will ambulate 150' over level surfaces with mod I using LRAD to indicate increased independence with household mobility, progress toward PLOF. Target date: 12/07/15   PT LONG TERM GOAL #5   Title Pt will ambulate 200' over unlevel, paved surfaces with supervision using LRAD to enable pt to safely walk from car to/from doctor's office with caregiver. Target date: 12/07/15   PT LONG TERM GOAL #6   Title Pt will perform all above aspects of mobility < / = 2-point increase in  within-session pain rating to indicate decreased impact of pain on functional activity tolerance. Target date: 12/07/15               Plan - 10/24/15 2049    Clinical Impression Statement Skilled session focused on promoting pt independence with HEP, increasing mobility/decreasing stiffness to improve gait stability. Pt continues to require 50% cueing for proper HEP performance; caregiver present only at beginning and end of session. Strongly recommended to pt/caregiver that caregiver remain present at next session to ensure proper carryover of home exercises. Both verbalized understanding. Revised STG 1 from performing HEP independently to performing with min A of caregiver. Noted improved gait velocity from .21 ft/sec to .48 ft/sec since PT evaluation.   Pt will benefit from skilled therapeutic intervention in order to improve on the following deficits Abnormal gait;Decreased endurance;Decreased coordination;Decreased balance;Decreased strength;Decreased mobility;Impaired tone;Postural dysfunction;Pain;Impaired sensation;Impaired flexibility;Decreased activity tolerance   Rehab Potential Good  Clinical Impairments Affecting Rehab Potential legal blindness   PT Frequency 2x / week   PT Duration 8 weeks   PT Treatment/Interventions Gait training;Functional mobility training;Therapeutic activities;Therapeutic exercise;Balance training;Patient/family education;Neuromuscular re-education;Manual techniques;Vestibular;DME Instruction   PT Next Visit Plan Ensure safe/effective HEP performance with caregiver assisting.    Consulted and Agree with Plan of Care Patient;Family member/caregiver   Family Member Consulted caregiver, Beverlee Nims        Problem List Patient Active Problem List   Diagnosis Date Noted  . 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 Neurogenic bowel 05/24/2015  . 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 Hypotonic neurogenic bladder   . Leg weakness   . Sacral fracture, closed  ([place]) 05/21/2015  . Neurosarcoidosis ([place]) 05/21/2015    Billie Ruddy, PT, [facility] [address] [place], [place], [address] Phone: [phone]   Fax:  [phone] 10/24/2015, 8:54 PM   Name: Jamie [name] MRN: [id number] Date of Birth: 01-Jun-1956

## 2015-10-24 NOTE — Patient Instructions (Signed)
Weight Shift: Lateral (Righting / Equilibrium) (SHIFTING YOUR WEIGHT FROM SIDE TO SIDE)    Stand facing the counter. Feet shoulder width apart, slowly shift weight over right leg. Return to starting position. Shift weight over left leg. Repeat __10-15__ times per session. Do __1-2__ sessions per day.   Forward/backward Rocking in Stagger Standing position    Stand in your kitchen galley area, where you can reach a stable counter on both sides. Stagger feet. Shift weight from front foot to back foot. When shifting to front foot, come up on the toes of your back foot. Perform 10 repetitions; then switch your feet.  Perform 15 reps, 2 times per day on each leg.   Single Step: Forward / Backward    Stand beside counter with chair or other counter to hold (both hands supported). Lifting foot off floor, take one step slowly forward with right leg. Return to starting position. Repeat with other leg, alternating legs _10___ times per session. Do __1-2__ sessions per day. Repeat with other leg.  Standing Rotation    Stand facing kitchen countertop. With left hand supported on counter, reach across body with right hand; follow right hand with your head. Perform 10 reps to right; then 10 reps to the left.  Do this exercise 1-2 times per day.   Feet Apart - Eyes Open    Stand with back to corner and stable chair in front of you (in case you lose your balance). Stand with feet apart and arms by your side, trying not to hold onto the chair. Hold for 30 seconds. Do this 4 times per day.

## 2015-10-26 ENCOUNTER — Ambulatory Visit: Payer: Medicare Other | Admitting: Physical Therapy

## 2015-10-28 ENCOUNTER — Ambulatory Visit: Payer: Medicare Other | Admitting: Physical Therapy

## 2015-10-28 DIAGNOSIS — R269 Unspecified abnormalities of gait and mobility: Secondary | ICD-10-CM

## 2015-10-28 DIAGNOSIS — R2681 Unsteadiness on feet: Secondary | ICD-10-CM

## 2015-10-28 DIAGNOSIS — R531 Weakness: Secondary | ICD-10-CM | POA: Diagnosis not present

## 2015-10-28 DIAGNOSIS — Z7409 Other reduced mobility: Secondary | ICD-10-CM | POA: Diagnosis not present

## 2015-10-28 NOTE — Patient Instructions (Addendum)
Weight Shift: Lateral (Righting / Equilibrium) (SHIFTING YOUR WEIGHT FROM SIDE TO SIDE)    Stand facing the counter. Feet shoulder width apart, slowly shift weight over right leg. Return to starting position. Shift weight over left leg. Repeat __10-15__ times per session. Do __1-2__ sessions per day.   Forward/backward Rocking in Stagger Standing position    Stand in your kitchen galley area, where you can reach a stable counter on both sides. Stagger feet. Shift weight from front foot to back foot. When shifting to front foot, come up on the toes of your back foot. Perform 10 repetitions; then switch your feet.  Perform 15 reps, 2 times per day on each leg.   Single Step: Forward / Backward    Stand beside counter with chair or other counter to hold (both hands supported). Lifting foot off floor, take one step slowly forward with right leg. Return to starting position. Repeat with other leg, alternating legs _10___ times per session. Do __1-2__ sessions per day. Repeat with other leg.  Standing Rotation    Stand facing kitchen countertop. With left hand supported on counter, reach across body with right hand; follow right hand with your head. Perform 10 reps to right; then 10 reps to the left.  Do this exercise 1-2 times per day.   Feet Apart - Eyes Open    Stand with back to corner and stable chair in front of you (in case you lose your balance). Stand with feet apart and arms by your side, trying not to hold onto the chair. Hold for 30 seconds. Do this 4 times per day.  Toe / Heel Raise (Standing)    Stand in front of a wall with both hands on your walker. Raise heels, then rock back on heels and raise toes. Repeat __10__ times. Do this 2-3 times per day.

## 2015-10-29 NOTE — Therapy (Signed)
[facility] [address] [place], [place], [address] Phone: [phone]   Fax:  [phone]  Physical Therapy Treatment  Patient Details  Name: Jamie [name] MRN: [id number] Date of Birth: 07/06/1956 Referring Provider: Alysia Penna, MD  Encounter Date: 10/28/2015      PT End of Session - 10/29/15 1553    Visit Number 5   Number of Visits 17   Date for PT Re-Evaluation 12/11/15   Authorization Type Medicare - [employer]   PT Start Time [phone]   PT Stop Time 1017   PT Time Calculation (min) 43 min   Activity Tolerance Patient tolerated treatment well   Behavior During Therapy [facility] for tasks assessed/performed      Past Medical History  Diagnosis Date  . 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 Progressive gait disorder     Was treated for probable MS until diagnosed with Neurosarcoidosis about 10 years ago.   . Thyroid disease   . Depression     No past surgical history on file.  There were no vitals filed for this visit.  Visit Diagnosis:  Abnormality of gait  Unsteadiness  Generalized weakness      Subjective Assessment - 10/29/15 1538    Subjective Pt with no falls, no significant changes to report. Pt/caregiver report pt is going on an outing next week and needs to be able to go up and down stairs to get into a van.   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 Yes   Pain  Score 3    Pain Location Sacrum   Pain Orientation Lower   Pain Descriptors / Indicators Burning   Pain Type Neuropathic pain   Pain Onset More than a month ago   Pain Frequency Intermittent   Aggravating Factors  lying on back; sitting down   Pain Relieving Factors remaining standing; walking   Effect of Pain on Daily Activities Pain will be monitored but not directly addressed in PT due to nature of neuropathic pain associated with sacral nerve injury.   Multiple Pain Sites No                         [facility] Adult PT Treatment/Exercise - 10/29/15 0001    Transfers   Transfers Sit to Stand;Stand to Sit   Sit to Stand 5: Supervision;4: Min assist   Stand to Sit 5: Supervision   Ambulation/Gait   Ambulation/Gait Yes   Ambulation/Gait Assistance 5: Supervision   Ambulation Distance (Feet) 345 Feet  2 x90', 30', and 50'   Assistive device Rolling walker   Gait Pattern Step-through pattern;Decreased stride length;Decreased hip/knee flexion - right;Decreased hip/knee flexion - left;Decreased dorsiflexion - right;Decreased dorsiflexion - left;Right foot flat;Left foot flat;Right flexed knee in stance;Left flexed knee in stance;Decreased trunk rotation;Trunk flexed;Narrow base of support   Ambulation Surface Level;Indoor   Gait velocity --   Stairs Yes   Stairs Assistance 5: Supervision;4: Min  guard   Stairs Assistance Details (indicate cue type and reason) Pt/caregiver unsure as to which side rails are on (in [place]); therefore, pt negotiated 4 stairs x3 trials with supervision/cueing from this PT for technique and hand placement. Initial trial forward-facing with B rails; subsequent trial ascending/descending laterally with BUE support on L rail; final trial performed laterally with BUE support on R rail. Pt performed all trials with step-to pattern. Required supervision for all techniques with exception of BUE support on R rail, which pt required min guard due to decreased LLE  strength.   Stair Management Technique One rail Right;One rail Left;Two rails;Step to pattern;Sideways;Forwards   Number of Stairs 12  4 stairs x3 consecutive trials   Posture/Postural Control   Posture/Postural Control Postural limitations   Postural Limitations Increased thoracic kyphosis;Posterior pelvic tilt;Flexed trunk   Static Standing Balance   Static Standing - Level of Assistance --   Exercises   Exercises Other Exercises   Other Exercises  With caregiver providing supervision/cueing, pt performed all home exercises provided in prior sessions. Caregiver read exercises from paper handout and provided cueing; this PT provided 25% cueing to correct caregiver's cues. Caregiver very receptive to feedback, taking notes throughout session. Added B ankle PF/DF with BUE support at [facility] for increased effectiveness of ankle strategy with LOB. See Pt Instructions for details on reps, and sets.             Balance Exercises - 10/29/15 1551    Balance Exercises: Standing   Standing Eyes Opened Wide (BOA);Foam/compliant surface;Head turns;Other reps (comment)  pillow x1 with horizontal/vertical head turns x10   Wall Bumps Hip   Wall Bumps-Hips Eyes opened;Anterior/posterior;20 reps           PT Education - 10/29/15 1541    Education provided Yes   Education Details HEP: reviewed with pt/caregiver and added heel/toe raises. See Pt Instructions. Recommended caregiver supervise HEP to ensure proper technique.   Person(s) Educated Patient;Caregiver(s)   Methods Explanation;Demonstration;Verbal cues;Tactile cues;Handout   Comprehension Verbalized understanding;Returned demonstration          PT Short Term Goals - 10/29/15 1558    PT SHORT TERM GOAL #1   Title Pt will perform HEP with min A of caregiver to maximize functional gains made in PT. Target date: 11/09/15   Baseline Revised from independent to min A of caregiver due to visual impairments, inconsistent carryover.   Status  Revised   PT SHORT TERM GOAL #2   Title Complete Berg and improve score by 4 points from baseline to indicate improved functional standing balance. Target date: 11/09/15   Baseline Berg not appropriate due pt inability to stand statically without UE support   Status Deferred   PT SHORT TERM GOAL #3   Title Pt will perform sit to/from stand x5 reps with supervision with no AD and minimal UE use to indicate improved functional LE strength. Target date: 11/09/15   PT SHORT TERM GOAL #4   Title Pt will improve gait velocity from .21 ft/sec to .51 ft/sec to indicate increased efficiency of ambulation. Target date: 11/09/15   Status On-going   PT SHORT TERM GOAL #5   Title Pt will ambulate 100' consecutively with no rest breaks using LRAD to indicate significant improvement in activity tolerance. Target date: 11/09/15   Baseline Met 11/11.   Status Achieved           PT Long Term Goals - 10/21/15 0952    PT LONG TERM GOAL #1  Title Pt will verbalize understanding of fall prevention strategies to decrease fall risk within home environment. Target date: 12/07/15   PT LONG TERM GOAL #2   Title Pt will increase gait velocity from .21 ft/sec to .81 ft/sec to indicate increased efficiency of ambulation. Target date: 12/07/15   PT LONG TERM GOAL #3   Title Pt will improve Berg score by 8 points from baseline to indicate significant improvement in functional standing balance. Target date: 12/07/15   Baseline Berg not appropriate due to pt inability to stand statically without UE support   Status Deferred   PT LONG TERM GOAL #4   Title Pt will ambulate 150' over level surfaces with mod I using LRAD to indicate increased independence with household mobility, progress toward PLOF. Target date: 12/07/15   PT LONG TERM GOAL #5   Title Pt will ambulate 200' over unlevel, paved surfaces with supervision using LRAD to enable pt to safely walk from car to/from doctor's office with caregiver. Target date:  12/07/15   PT LONG TERM GOAL #6   Title Pt will perform all above aspects of mobility < / = 2-point increase in within-session pain rating to indicate decreased impact of pain on functional activity tolerance. Target date: 12/07/15               Plan - 10/29/15 1555    Clinical Impression Statement Caregiver present during this session to ensure carryover of proper technique with HEP. STG for ambulation tolerance met, as pt ambulated >300' consecutively without rest break. Pt demonstrating progressive improvement in standing balance, activity tolerance, efficiency of mobility, and functional LE strength.    Pt will benefit from skilled therapeutic intervention in order to improve on the following deficits Abnormal gait;Decreased endurance;Decreased coordination;Decreased balance;Decreased strength;Decreased mobility;Impaired tone;Postural dysfunction;Pain;Impaired sensation;Impaired flexibility;Decreased activity tolerance   Clinical Impairments Affecting Rehab Potential legal blindness   PT Frequency 2x / week   PT Duration 8 weeks   PT Treatment/Interventions Gait training;Functional mobility training;Therapeutic activities;Therapeutic exercise;Balance training;Patient/family education;Neuromuscular re-education;Manual techniques;Vestibular;DME Instruction   PT Next Visit Plan Continue with functional LE strengthening, standing balance training; postural retraining.   Consulted and Agree with Plan of Care Patient;Family member/caregiver   Family Member Consulted caregiver, Beverlee Nims        Problem List Patient Active Problem List   Diagnosis Date Noted  . 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 Neurogenic bowel 05/24/2015  . 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 Hypotonic neurogenic bladder   . Leg weakness   . Sacral fracture, closed ([place]) 05/21/2015  . Neurosarcoidosis ([place]) 05/21/2015    Billie Ruddy, PT, [place] [address]  [place], [place], [address] Phone: [phone]   Fax:  [phone] 10/29/2015, 4:00 PM   Name: Juniper Cobey MRN: [id number] Date of Birth: Apr 23, 1956

## 2015-11-01 ENCOUNTER — Ambulatory Visit: Payer: Medicare Other | Admitting: Physical Therapy

## 2015-11-01 DIAGNOSIS — R269 Unspecified abnormalities of gait and mobility: Secondary | ICD-10-CM

## 2015-11-01 DIAGNOSIS — R531 Weakness: Secondary | ICD-10-CM

## 2015-11-01 DIAGNOSIS — Z7409 Other reduced mobility: Secondary | ICD-10-CM

## 2015-11-01 DIAGNOSIS — R2681 Unsteadiness on feet: Secondary | ICD-10-CM | POA: Diagnosis not present

## 2015-11-01 NOTE — Therapy (Signed)
[facility] [address] [place], [place], [address] Phone: [phone]   Fax:  [phone]  Physical Therapy Treatment  Patient Details  Name: Jamie [name] MRN: [id number] Date of Birth: Aug 06, 1956 Referring Provider: Alysia Penna, MD  Encounter Date: 11/01/2015      PT End of Session - 11/01/15 1314    Visit Number 6   Number of Visits 17   Date for PT Re-Evaluation 12/11/15   Authorization Type Medicare - [employer]odes   PT Start Time 6503   PT Stop Time 1015   PT Time Calculation (min) 41 min   Equipment Utilized During Treatment Gait belt   Activity Tolerance Patient tolerated treatment well   Behavior During Therapy [facility] for tasks assessed/performed      Past Medical History  Diagnosis Date  . 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 Progressive gait disorder     Was treated for probable MS until diagnosed with Neurosarcoidosis about 10 years ago.   . Thyroid disease   . Depression     No past surgical history on file.  There were no vitals filed for this visit.  Visit Diagnosis:  Generalized weakness  Impaired functional mobility, balance, and endurance  Abnormality of gait      Subjective Assessment - 11/01/15 0939    Subjective Pt reports no changes since last visit.  Reports working to try not to shuffle her feet with gait.   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 Yes   Pain Score 3    Pain Location Sacrum   Pain Orientation Lower   Pain Descriptors / Indicators Burning   Pain Type Neuropathic pain   Pain Radiating Towards radiates into left thigh   Pain Frequency Intermittent   Aggravating Factors  lying on back, sitting down, bending over   Pain Relieving Factors standing or walking better than sitting                         [facility] Adult PT Treatment/Exercise - 11/01/15 0942    Transfers   Transfers Sit to Stand;Stand to Sit   Sit to Stand 5: Supervision   Sit to Stand Details (indicate cue type and reason) Sits on pillow due to sacral pain  Initial cues for hand placement for transfers   Stand to Sit 5: Supervision   Number of Reps Other reps (comment);Other sets (comment)  from 20" surface, then 18" surface    Comments Repetitive sit<>stand for functional lower extremity strengthening   Ambulation/Gait   Ambulation/Gait Yes   Ambulation/Gait Assistance 5: Supervision   Ambulation Distance (Feet) 100 Feet  then 120 ft   Assistive device Rolling walker   Gait Pattern Step-through pattern;Decreased step length - right;Decreased step length - left   Ambulation Surface Level;Indoor   Stairs Yes   Stairs Assistance 5: Supervision;4: Min guard   Stairs Assistance Details (indicate cue type and reason) Pt negotiates 4 steps with bilateral handrails>R handrail, initial trial forward facing to ascend the steps, then subsequent lateral facing ascending and descending steps with bilateral UE  support on the one rail.    Stair Management Technique One rail Right;One rail Left;Two rails;Step to pattern;Sideways;Forwards   Number of Stairs 12  4 steps x 3 reps   Posture/Postural Control   Posture Comments Postural education exercises at doorframe, scapular squeezes x 10 reps for best upright posture.   High Level Balance   High Level Balance Comments Review of forward step and weightshift per caregiver, Diana's request, x 10 reps each, then back step and weightshift x 10 reps, with bilateral UE supporting counter.   Exercises   Exercises  Knee/Hip   Knee/Hip Exercises: Standing   Forward Step Up Right;Left;1 set;10 reps;Hand Hold: 2;Step Height: 6"   Forward Step Up Limitations Tactile cues for full knee extension and for upright posture   Wall Squat 1 set;10 reps                  PT Short Term Goals - 10/29/15 1558    PT SHORT TERM GOAL #1   Title Pt will perform HEP with min A of caregiver to maximize functional gains made in PT. Target date: 11/09/15   Baseline Revised from independent to min A of caregiver due to visual impairments, inconsistent carryover.   Status Revised   PT SHORT TERM GOAL #2   Title Complete Berg and improve score by 4 points from baseline to indicate improved functional standing balance. Target date: 11/09/15   Baseline Berg not appropriate due pt inability to stand statically without UE support   Status Deferred   PT SHORT TERM GOAL #3   Title Pt will perform sit to/from stand x5 reps with supervision with no AD and minimal UE use to indicate improved functional LE strength. Target date: 11/09/15   PT SHORT TERM GOAL #4   Title Pt will improve gait velocity from .21 ft/sec to .51 ft/sec to indicate increased efficiency of ambulation. Target date: 11/09/15   Status On-going   PT SHORT TERM GOAL #5   Title Pt will ambulate 100' consecutively with no rest breaks using LRAD to indicate significant improvement in activity tolerance. Target date: 11/09/15   Baseline Met 11/11.   Status Achieved           PT Long Term Goals - 10/21/15 9935    PT LONG TERM GOAL #1   Title Pt will verbalize understanding of fall prevention strategies to decrease fall risk within home environment. Target date: 12/07/15   PT LONG TERM GOAL #2   Title Pt will increase gait velocity from .21 ft/sec to .81 ft/sec to indicate increased efficiency of ambulation. Target date: 12/07/15   PT LONG TERM GOAL #3   Title Pt will improve Berg score by 8 points from baseline to indicate significant improvement in  functional standing balance. Target date: 12/07/15   Baseline Berg not appropriate due to pt inability to stand statically without UE support   Status Deferred   PT LONG TERM GOAL #4   Title Pt will ambulate 150' over level surfaces with mod I using LRAD to indicate increased independence with household mobility, progress toward PLOF. Target date: 12/07/15   PT LONG TERM GOAL #5   Title Pt will ambulate 200' over unlevel, paved surfaces with supervision using LRAD to enable pt to safely walk from car to/from doctor's office with caregiver. Target date: 12/07/15   PT LONG TERM GOAL #6   Title Pt will perform all above aspects of mobility < / = 2-point increase in within-session pain rating  to indicate decreased impact of pain on functional activity tolerance. Target date: 12/07/15               Plan - 11/01/15 1314    Clinical Impression Statement Skilled PT addressing functional strengthening and posture exercises this visit.  Pt tolerates functional strengthening well without complaints and would like some of the exercises added to HEP (likely to add to HEP next visit).  Pt will continue to benefit from further skilled PT to address balance, functional strengthening and posture, and gait.   Pt will benefit from skilled therapeutic intervention in order to improve on the following deficits Abnormal gait;Decreased endurance;Decreased coordination;Decreased balance;Decreased strength;Decreased mobility;Impaired tone;Postural dysfunction;Pain;Impaired sensation;Impaired flexibility;Decreased activity tolerance   Clinical Impairments Affecting Rehab Potential legal blindness   PT Frequency 2x / week   PT Duration 8 weeks   PT Treatment/Interventions Gait training;Functional mobility training;Therapeutic activities;Therapeutic exercise;Balance training;Patient/family education;Neuromuscular re-education;Manual techniques;Vestibular;DME Instruction   PT Next Visit Plan Continue with functional LE  strengthening, standing balance training; postural retraining; add to HEP (sit<>stand, wall squats, forward step ups)   Consulted and Agree with Plan of Care Patient;Family member/caregiver   Family Member Consulted caregiver, Beverlee Nims        Problem List Patient Active Problem List   Diagnosis Date Noted  . 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 Neurogenic bowel 05/24/2015  . 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 Hypotonic neurogenic bladder   . Leg weakness   . Sacral fracture, closed ([place]) 05/21/2015  . Neurosarcoidosis ([place]) 05/21/2015    Donold Marotto W. 11/01/2015, 10:14 PM  Frazier Butt., PT  [facility] [address] [place], [place], [address] Phone: [phone]   Fax:  [phone]  Name: Jamie [name] MRN: [id number] Date of Birth: Oct 22, 1956

## 2015-11-02 DIAGNOSIS — N3944 Nocturnal enuresis: Secondary | ICD-10-CM | POA: Diagnosis not present

## 2015-11-04 ENCOUNTER — Ambulatory Visit: Payer: Medicare Other | Admitting: Physical Therapy

## 2015-11-04 DIAGNOSIS — R269 Unspecified abnormalities of gait and mobility: Secondary | ICD-10-CM | POA: Diagnosis not present

## 2015-11-04 DIAGNOSIS — Z7409 Other reduced mobility: Secondary | ICD-10-CM | POA: Diagnosis not present

## 2015-11-04 DIAGNOSIS — R531 Weakness: Secondary | ICD-10-CM | POA: Diagnosis not present

## 2015-11-04 DIAGNOSIS — R2681 Unsteadiness on feet: Secondary | ICD-10-CM | POA: Diagnosis not present

## 2015-11-04 NOTE — Patient Instructions (Signed)
Therapeutic - Strengthening Knees - Wall Slide    Stand with your hips, back and shoulders against the wall, with your feet about 6-12 inches from the wall.  With back pressed against wall, gently slide down until knees are bent. Hold __2__ seconds, then slide up the wall to return to standing. Repeat _2 sets of 10__ times.  Copyright  VHI. All rights reserved.    Shoulder (Scapula) Retraction    Stand with your back against the doorframe.  You can have your elbows bent, keeping your elbows at your side (your start position will be like "hands on a steering wheel").  Pull shoulders back, squeezing shoulder blades together. Repeat 10____ times per session. Do __1-2__ sessions per day.  Copyright  VHI. All rights reserved.

## 2015-11-04 NOTE — Therapy (Signed)
[facility] [address] [place], [place], [address] Phone: [phone]   Fax:  [phone]  Physical Therapy Treatment  Patient Details  Name: Jamie [name] MRN: [id number] Date of Birth: [birth year] Referring Provider: Alysia Penna, MD  Encounter Date: 11/04/2015      PT End of Session - 11/04/15 2125    Visit Number 7   Number of Visits 17   Date for PT Re-Evaluation 12/11/15   Authorization Type Medicare - [employer]odes   PT Start Time 9292   PT Stop Time 1014   PT Time Calculation (min) 38 min   Equipment Utilized During Treatment Gait belt   Activity Tolerance Patient tolerated treatment well   Behavior During Therapy [facility] for tasks assessed/performed      Past Medical History  Diagnosis Date  . 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 Progressive gait disorder     Was treated for probable MS until diagnosed with Neurosarcoidosis about 10 years ago.   . Thyroid disease   . Depression     No past surgical history on file.  There were no vitals filed for this visit.  Visit Diagnosis:  Impaired functional mobility, balance, and endurance  Generalized weakness  Abnormality of gait      Subjective Assessment - 11/04/15 0941    Subjective Going on outing today to museum.  Want to practice stairs again.   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 Yes   Pain Score 3    Pain Location Sacrum   Pain Orientation Lower   Pain Descriptors / Indicators Burning   Pain Type Neuropathic pain   Pain  Radiating Towards radiates into thigh at times   Pain Onset More than a month ago   Pain Frequency Intermittent   Aggravating Factors  bending over, sitting too long   Pain Relieving Factors standing and walking                         [facility] Adult PT Treatment/Exercise - 11/04/15 0001    Transfers   Transfers Sit to Stand;Stand to Sit   Sit to Stand 5: Supervision;From chair/3-in-1;With upper extremity assist   Stand to Sit 5: Supervision;To chair/3-in-1;With upper extremity assist   Ambulation/Gait   Ambulation/Gait Yes   Ambulation/Gait Assistance 5: Supervision   Ambulation Distance (Feet) 120 Feet   Assistive device Rolling walker   Gait Pattern Step-through pattern;Decreased step length - right;Decreased step length - left   Ambulation Surface Level;Indoor   Stairs Yes   Stairs Assistance 5: Supervision   Stairs Assistance Details (indicate cue type and reason) Pt negotiates 4 steps with R handrail only (ascending) and L handrail only (descending) with bilateral UE support, angled, step-to pattern, multiple reps in preparation for negotiating into and out of [place] for outing today.  Also practiced at least 3 reps of step-to pattern negotiation of 8 inch steps, with HHA, min guard assistance, with cues for adequate foot clearance to negotiate taller step.   Stair Management Technique One rail Right;One rail Left;Two rails;Step to pattern;Sideways;Forwards  Number of Stairs 16  then 2 steps x 3 reps at 8 inch step   Posture/Postural Control   Posture Comments Postural education exercises at doorframe-scapular squeezes x 10 reps, with cues for techique   Exercises   Exercises Knee/Hip   Knee/Hip Exercises: Standing   Forward Step Up Right;Left;1 set;10 reps;Hand Hold: 2;Step Height: 6"  Trialed R and L HHA of 1 side x 5 reps with min assist   Forward Step Up Limitations Caregiver requests instructions on performing forward step ups in ILF, but rails are too far apart  for bilateral HHA.  PT feels that forward step ups with one HHA would be unsafe and not appropriate for pt to complete at home at this time.  Will continue to perform in clinic for functional strengthening.   Wall Squat 1 set;10 reps                PT Education - 11/04/15 2124    Education provided Yes   Education Details Continued stair training with pt and caregiver, instructions for negotiating taller steps into van for outing to museum today; added to HEP-see pt instructions   Person(s) Educated Patient;Caregiver(s)   Methods Explanation;Demonstration;Handout;Verbal cues   Comprehension Verbalized understanding;Returned demonstration          PT Short Term Goals - 10/29/15 1558    PT SHORT TERM GOAL #1   Title Pt will perform HEP with min A of caregiver to maximize functional gains made in PT. Target date: 11/09/15   Baseline Revised from independent to min A of caregiver due to visual impairments, inconsistent carryover.   Status Revised   PT SHORT TERM GOAL #2   Title Complete Berg and improve score by 4 points from baseline to indicate improved functional standing balance. Target date: 11/09/15   Baseline Berg not appropriate due pt inability to stand statically without UE support   Status Deferred   PT SHORT TERM GOAL #3   Title Pt will perform sit to/from stand x5 reps with supervision with no AD and minimal UE use to indicate improved functional LE strength. Target date: 11/09/15   PT SHORT TERM GOAL #4   Title Pt will improve gait velocity from .21 ft/sec to .51 ft/sec to indicate increased efficiency of ambulation. Target date: 11/09/15   Status On-going   PT SHORT TERM GOAL #5   Title Pt will ambulate 100' consecutively with no rest breaks using LRAD to indicate significant improvement in activity tolerance. Target date: 11/09/15   Baseline Met 11/11.   Status Achieved           PT Long Term Goals - 10/21/15 2947    PT LONG TERM GOAL #1   Title Pt will  verbalize understanding of fall prevention strategies to decrease fall risk within home environment. Target date: 12/07/15   PT LONG TERM GOAL #2   Title Pt will increase gait velocity from .21 ft/sec to .81 ft/sec to indicate increased efficiency of ambulation. Target date: 12/07/15   PT LONG TERM GOAL #3   Title Pt will improve Berg score by 8 points from baseline to indicate significant improvement in functional standing balance. Target date: 12/07/15   Baseline Berg not appropriate due to pt inability to stand statically without UE support   Status Deferred   PT LONG TERM GOAL #4   Title Pt will ambulate 150' over level surfaces with mod I using LRAD to indicate increased independence with household mobility, progress toward PLOF. Target date:  12/07/15   PT LONG TERM GOAL #5   Title Pt will ambulate 200' over unlevel, paved surfaces with supervision using LRAD to enable pt to safely walk from car to/from doctor's office with caregiver. Target date: 12/07/15   PT LONG TERM GOAL #6   Title Pt will perform all above aspects of mobility < / = 2-point increase in within-session pain rating to indicate decreased impact of pain on functional activity tolerance. Target date: 12/07/15               Plan - 11/04/15 2126    Clinical Impression Statement Focused most of instruction today on reinforcement of safe stair negotiation, at 6 inch and 8 inch steps, in preparation for stair negotation in [place] for outing to [place] later today.  Pt and caregiver verbalize and demonstrate understanding of safe stair negotiation.  HEP updated for targeting lower extremity strength.  Pt will continue to benefit from further skilled PT to address balance, strength and gait.   Pt will benefit from skilled therapeutic intervention in order to improve on the following deficits Abnormal gait;Decreased endurance;Decreased coordination;Decreased balance;Decreased strength;Decreased mobility;Impaired tone;Postural  dysfunction;Pain;Impaired sensation;Impaired flexibility;Decreased activity tolerance   Clinical Impairments Affecting Rehab Potential legal blindness   PT Frequency 2x / week   PT Duration 8 weeks   PT Treatment/Interventions Gait training;Functional mobility training;Therapeutic activities;Therapeutic exercise;Balance training;Patient/family education;Neuromuscular re-education;Manual techniques;Vestibular;DME Instruction   PT Next Visit Plan Check/review HEP; begin checking STGs   Consulted and Agree with Plan of Care Patient;Family member/caregiver   Family Member Consulted caregiver, Beverlee Nims        Problem List Patient Active Problem List   Diagnosis Date Noted  . 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 Neurogenic bowel 05/24/2015  . 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 Hypotonic neurogenic bladder   . Leg weakness   . Sacral fracture, closed ([place]) 05/21/2015  . Neurosarcoidosis ([place]) 05/21/2015    Shaleah Nissley W. 11/04/2015, 9:32 PM  Frazier Butt., PT  [facility] [address] [place], [place], [address] Phone: [phone]   Fax:  [phone]  Name: Jamie [name] MRN: [id number] Date of Birth: Jun 22, 1956

## 2015-11-07 ENCOUNTER — Ambulatory Visit: Payer: Medicare Other | Admitting: Physical Therapy

## 2015-11-07 DIAGNOSIS — R269 Unspecified abnormalities of gait and mobility: Secondary | ICD-10-CM | POA: Diagnosis not present

## 2015-11-07 DIAGNOSIS — R531 Weakness: Secondary | ICD-10-CM | POA: Diagnosis not present

## 2015-11-07 DIAGNOSIS — R2681 Unsteadiness on feet: Secondary | ICD-10-CM

## 2015-11-07 DIAGNOSIS — Z7409 Other reduced mobility: Secondary | ICD-10-CM | POA: Diagnosis not present

## 2015-11-07 NOTE — Therapy (Signed)
[facility] [address] [place], [place], [address] Phone: [phone]   Fax:  [phone]  Physical Therapy Treatment  Patient Details  Name: Jamie [name] MRN: [id number] Date of Birth: July 07, 1956 Referring Provider: Alysia Penna, MD  Encounter Date: 11/07/2015      PT End of Session - 11/07/15 [place]    Visit Number 8   Number of Visits 17   Date for PT Re-Evaluation 12/11/15   Authorization Type Medicare - [employer]odes   PT Start Time 1019   PT Stop Time 1100   PT Time Calculation (min) 41 min   Equipment Utilized During Treatment Gait belt   Activity Tolerance Patient tolerated treatment well   Behavior During Therapy [facility] for tasks assessed/performed      Past Medical History  Diagnosis Date  . 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 Progressive gait disorder     Was treated for probable MS until diagnosed with Neurosarcoidosis about 10 years ago.   . Thyroid disease   . Depression     No past surgical history on file.  There were no vitals filed for this visit.  Visit Diagnosis:  Abnormality of gait  Generalized weakness  Unsteadiness      Subjective Assessment - 11/07/15 1024    Subjective Pt reports no problems getting on and off the van to [employer] outing the other day.   Currently in Pain? Yes   Pain Score 3    Pain Location Sacrum   Pain Orientation Lower;Mid   Pain Descriptors / Indicators Burning   Pain Type Neuropathic pain   Pain Onset More than a month ago   Pain Frequency Intermittent   Aggravating Factors  bending over, sitting too long   Pain Relieving Factors standing and walking are better positions.   Effect of Pain on Daily Activities Pain will be monitored, but not directly addressed in PT due to nature of neuropathic pain associated with sacral nerve  injury.            [facility] PT Assessment - 11/07/15 0001    Ambulation/Gait   Gait velocity .67 ft/sec   Standardized Balance Assessment   Standardized Balance Assessment Berg Balance Test   Berg Balance Test   Sit to Stand Able to stand  independently using hands   Standing Unsupported Able to stand 2 minutes with supervision   Sitting with Back Unsupported but Feet Supported on Floor or Stool Able to sit safely and securely 2 minutes   Stand to Sit Controls descent by using hands   Transfers Able to transfer with verbal cueing and /or supervision   Standing Unsupported with Eyes Closed Able to stand 3 seconds   Standing Ubsupported with Feet Together Needs help to attain position and unable to hold for 15 seconds   From Standing, Reach Forward with Outstretched Arm Loses balance while trying/requires external support   From Standing Position, Pick up Object from Floor Unable to try/needs assist to keep balance   From Standing Position, Turn to Look Behind Over each Shoulder Needs assist to keep from losing balance and falling   Turn 360 Degrees Needs assistance while turning   Standing Unsupported, Alternately Place Feet on Step/Stool Needs assistance to keep from falling or unable to try   Standing Unsupported, One Foot in Front Needs help to  step but can hold 15 seconds   Standing on One Leg Unable to try or needs assist to prevent fall   Total Score 18   Berg comment: Scores <45/56 indicate increased fall risk     Caregiver questions whether patient is safe to perform walking tasks at railing in hallway with just one HHA.  Explained results of Berg Balance score, including fall risk and need for use of walker at all times for safety.                [facility] Adult PT Treatment/Exercise - 11/07/15 0001    Transfers   Transfers Sit to Stand;Stand to Sit   Sit to Stand 5: Supervision;With upper extremity assist;From chair/3-in-1   Sit to Stand Details (indicate cue type  and reason) Sits on pillow due to sacral pain; needs cues for using hands to boost forward and up to stand, versus pushing through hands and having difficulty propelling forward to stand up at walker   Stand to Sit 5: Supervision;To chair/3-in-1;With upper extremity assist   Number of Reps 2 sets;Other reps (comment)  5 reps   Ambulation/Gait   Ambulation/Gait Yes   Ambulation/Gait Assistance 5: Supervision   Ambulation/Gait Assistance Details Pt has multiple episodes of walker veering to R side; therapist assist for keeping RW on straight path   Ambulation Distance (Feet) 120 Feet  x 2, 60 ft x 2   Assistive device Rolling walker   Gait Pattern Step-through pattern;Decreased step length - right;Decreased step length - left   Ambulation Surface Level;Indoor                  PT Short Term Goals - 11/07/15 1046    PT SHORT TERM GOAL #1   Title Pt will perform HEP with min A of caregiver to maximize functional gains made in PT. Target date: 11/09/15   Baseline Revised from independent to min A of caregiver due to visual impairments, inconsistent carryover.   Status Revised   PT SHORT TERM GOAL #2   Title Complete Berg and improve score by 4 points from baseline to indicate improved functional standing balance. Target date: 11/09/15   Baseline Berg not appropriate due pt inability to stand statically without UE support   Status Deferred   PT SHORT TERM GOAL #3   Title Pt will perform sit to/from stand x5 reps with supervision with no AD and minimal UE use to indicate improved functional LE strength. Target date: 11/09/15   Status Achieved   PT SHORT TERM GOAL #4   Title Pt will improve gait velocity from .21 ft/sec to .51 ft/sec to indicate increased efficiency of ambulation. Target date: 11/09/15   Status Achieved   PT SHORT TERM GOAL #5   Title Pt will ambulate 100' consecutively with no rest breaks using LRAD to indicate significant improvement in activity tolerance. Target  date: 11/09/15   Baseline Met 11/11.   Status Achieved           PT Long Term Goals - 11/07/15 1829    PT LONG TERM GOAL #1   Title Pt will verbalize understanding of fall prevention strategies to decrease fall risk within home environment. Target date: 12/07/15   PT LONG TERM GOAL #2   Title Pt will increase gait velocity from .21 ft/sec to .81 ft/sec to indicate increased efficiency of ambulation. Target date: 12/07/15   PT LONG TERM GOAL #3   Title Pt will improve Berg score by 8 points from  baseline to indicate significant improvement in functional standing balance. Target date: 12/07/15   Baseline Berg not appropriate due to pt inability to stand statically without UE support   Status Deferred   PT LONG TERM GOAL #4   Title Pt will ambulate 150' over level surfaces with mod I using LRAD to indicate increased independence with household mobility, progress toward PLOF. Target date: 12/07/15   PT LONG TERM GOAL #5   Title Pt will ambulate 200' over unlevel, paved surfaces with supervision using LRAD to enable pt to safely walk from car to/from doctor's office with caregiver. Target date: 12/07/15   PT LONG TERM GOAL #6   Title Pt will perform all above aspects of mobility < / = 2-point increase in within-session pain rating to indicate decreased impact of pain on functional activity tolerance. Target date: 12/07/15               Plan - 11/07/15 1830    Clinical Impression Statement Pt has met STG 3, 4, 5.  STG #1 likely to be met, with HEP to be checked next visit.  STG # 2 deferred due to pt's decreased ability for unassisted standing.  Pt is able to partially complete Berg today, but is limited due to increased trunk sway, decreased hip stability in unassisted standing.  Pt will continue to benefit from further skilled PT to address balance, gait, transfers, and functional strength.   Pt will benefit from skilled therapeutic intervention in order to improve on the following  deficits Abnormal gait;Decreased endurance;Decreased coordination;Decreased balance;Decreased strength;Decreased mobility;Impaired tone;Postural dysfunction;Pain;Impaired sensation;Impaired flexibility;Decreased activity tolerance   Clinical Impairments Affecting Rehab Potential legal blindness   PT Frequency 2x / week   PT Duration 8 weeks   PT Treatment/Interventions Gait training;Functional mobility training;Therapeutic activities;Therapeutic exercise;Balance training;Patient/family education;Neuromuscular re-education;Manual techniques;Vestibular;DME Instruction   PT Next Visit Plan Check/review HEP; work on gait training in [place]   Consulted and Agree with Plan of Care Patient;Family member/caregiver   Family Member Consulted caregiver, Beverlee Nims        Problem List Patient Active Problem List   Diagnosis Date Noted  . 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 Neurogenic bowel 05/24/2015  . 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 Hypotonic neurogenic bladder   . Leg weakness   . Sacral fracture, closed ([place]) 05/21/2015  . Neurosarcoidosis ([place]) 05/21/2015    Yuvin Bussiere W. 11/07/2015, 6:38 PM  Frazier Butt., PT  [facility] [address] [place], [place], [address] Phone: [phone]   Fax:  [phone]  Name: Erlean Mealor MRN: [id number] Date of Birth: October 20, 1956

## 2015-11-08 ENCOUNTER — Ambulatory Visit: Payer: Medicare Other | Admitting: Physical Therapy

## 2015-11-08 DIAGNOSIS — Z7409 Other reduced mobility: Secondary | ICD-10-CM | POA: Diagnosis not present

## 2015-11-08 DIAGNOSIS — R269 Unspecified abnormalities of gait and mobility: Secondary | ICD-10-CM | POA: Diagnosis not present

## 2015-11-08 DIAGNOSIS — R2681 Unsteadiness on feet: Secondary | ICD-10-CM

## 2015-11-08 DIAGNOSIS — R531 Weakness: Secondary | ICD-10-CM | POA: Diagnosis not present

## 2015-11-08 NOTE — Therapy (Signed)
[facility] [address] [place], [place], [address] Phone: [phone]   Fax:  [phone]  Physical Therapy Treatment  Patient Details  Name: Jamie [name] MRN: [id number] Date of Birth: 1956-06-03 Referring Provider: Alysia Penna, MD  Encounter Date: 11/08/2015      PT End of Session - 11/08/15 1223    Visit Number 9   Number of Visits 17   Date for PT Re-Evaluation 12/11/15   Authorization Type Medicare - GCodes   PT Start Time 1018   PT Stop Time 1100   PT Time Calculation (min) 42 min   Equipment Utilized During Treatment Gait belt   Activity Tolerance Patient tolerated treatment well   Behavior During Therapy [facility] for tasks assessed/performed      Past Medical History  Diagnosis Date  . 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 Progressive gait disorder     Was treated for probable MS until diagnosed with Neurosarcoidosis about 10 years ago.   . Thyroid disease   . Depression     No past surgical history on file.  There were no vitals filed for this visit.  Visit Diagnosis:  Abnormality of gait  Generalized weakness  Unsteadiness  Impaired functional mobility, balance, and endurance      Subjective Assessment - 11/08/15 1021    Subjective No changes since last visit.   Currently in Pain? Yes  about the same as yesterday   Pain Score 3    Pain Location Sacrum   Pain Orientation Lower;Mid   Pain Descriptors / Indicators Burning   Pain Type Neuropathic pain   Pain Onset More than a month ago   Pain Frequency Intermittent   Aggravating Factors  bending over, sitting too long   Pain Relieving Factors standing and walking are better positions   Effect of Pain on Daily Activities PT will monitor pain, but will not be directly addressed by PT at this time due to nature of  neuropathic pain associate with sacral nerve injury.                         [facility] Adult PT Treatment/Exercise - 11/08/15 1024    Ambulation/Gait   Ambulation/Gait Yes   Ambulation/Gait Assistance 5: Supervision   Ambulation/Gait Assistance Details Pt has no episodes of veering walker to R today during therapy session.   Ambulation Distance (Feet) 120 Feet  x 2, then 100   Assistive device Rolling walker   Gait Pattern Step-through pattern;Decreased step length - right;Decreased step length - left   Ambulation Surface Level;Indoor   Gait Comments With gait, cues provided for lateral weigthshifting to improve foot clearance and step length.  Pt has difficulty with push-off phase of gait pattern as well.   High Level Balance   High Level Balance Activities Side stepping;Backward walking;Marching forwards;Marching backwards  Forward walking-all in parallel bars, 3 reps x 10 reps    High Level Balance Comments Standing shoulder width apart, with alternating UE reaches, bilateral UE reaches (with min guard assistanc), marching in place 2 sets of 10 reps, ankle/hip strategies in parallel bars-heel/toe raises then hip flex/extension (wall bumps) 2 sets x 10 reps each.  Cues given for upright posture and lessening UE support   Exercises   Other Exercises  Review of wall squats, scapular squeezes  at doorframe, x 10 reps each, pt performs with supervision.                  PT Short Term Goals - 11/08/15 1224    PT SHORT TERM GOAL #1   Title Pt will perform HEP with min A of caregiver to maximize functional gains made in PT. Target date: 11/09/15   Baseline Revised from independent to min A of caregiver due to visual impairments, inconsistent carryover.   Status Achieved   PT SHORT TERM GOAL #2   Title Complete Berg and improve score by 4 points from baseline to indicate improved functional standing balance. Target date: 11/09/15   Baseline Berg not appropriate due pt  inability to stand statically without UE support   Status Deferred   PT SHORT TERM GOAL #3   Title Pt will perform sit to/from stand x5 reps with supervision with no AD and minimal UE use to indicate improved functional LE strength. Target date: 11/09/15   Status Achieved   PT SHORT TERM GOAL #4   Title Pt will improve gait velocity from .21 ft/sec to .51 ft/sec to indicate increased efficiency of ambulation. Target date: 11/09/15   Status Achieved   PT SHORT TERM GOAL #5   Title Pt will ambulate 100' consecutively with no rest breaks using LRAD to indicate significant improvement in activity tolerance. Target date: 11/09/15   Baseline Met 11/11.   Status Achieved           PT Long Term Goals - 11/08/15 1224    PT LONG TERM GOAL #1   Title Pt will verbalize understanding of fall prevention strategies to decrease fall risk within home environment. Target date: 12/07/15   Time 8   Period Weeks   Status New   PT LONG TERM GOAL #2   Title Pt will increase gait velocity from .21 ft/sec to .81 ft/sec to indicate increased efficiency of ambulation. Target date: 12/07/15   Time 8   Period Weeks   Status New   PT LONG TERM GOAL #3   Title Pt will improve Berg score by 8 points from baseline to indicate significant improvement in functional standing balance. Target date: 12/07/15   Baseline Berg not appropriate due to pt inability to stand statically without UE support   Status Deferred   PT LONG TERM GOAL #4   Title Pt will ambulate 150' over level surfaces with mod I using LRAD to indicate increased independence with household mobility, progress toward PLOF. Target date: 12/07/15   Time 8   Period Weeks   Status New   PT LONG TERM GOAL #5   Title Pt will ambulate 200' over unlevel, paved surfaces with supervision using LRAD to enable pt to safely walk from car to/from doctor's office with caregiver. Target date: 12/07/15   Time 8   Period Weeks   Status New   PT LONG TERM GOAL #6    Title Pt will perform all above aspects of mobility < / = 2-point increase in within-session pain rating to indicate decreased impact of pain on functional activity tolerance. Target date: 12/07/15   Time 8   Period Weeks   Status New               Plan - 11/08/15 1225    Clinical Impression Statement Pt has met STG #1, with pt appearing to perform HEP consistently.  LTGs appear appropriate.  Pt continues to benefit from further skilled PT activities for  improved balance, strength and gait to decrease fall risk and improve functional mobility.   Pt will benefit from skilled therapeutic intervention in order to improve on the following deficits Abnormal gait;Decreased endurance;Decreased coordination;Decreased balance;Decreased strength;Decreased mobility;Impaired tone;Postural dysfunction;Pain;Impaired sensation;Impaired flexibility;Decreased activity tolerance   Clinical Impairments Affecting Rehab Potential legal blindness   PT Frequency 2x / week   PT Duration 8 weeks   PT Treatment/Interventions Gait training;Functional mobility training;Therapeutic activities;Therapeutic exercise;Balance training;Patient/family education;Neuromuscular re-education;Manual techniques;Vestibular;DME Instruction   PT Next Visit Plan gait and balance training in parallel bars; trunk control activities.  G CODE NEXT VISIT   Consulted and Agree with Plan of Care Patient;Family member/caregiver   Family Member Consulted caregiver, Beverlee Nims        Problem List Patient Active Problem List   Diagnosis Date Noted  . 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 Neurogenic bowel 05/24/2015  . 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 Hypotonic neurogenic bladder   . Leg weakness   . Sacral fracture, closed ([place]) 05/21/2015  . Neurosarcoidosis ([place]) 05/21/2015    Deazia Lampi W. 11/08/2015, 12:30 PM [facility] [address] [place], [place], [address] Phone: [phone]   Fax:  [phone]  Name: Jamie [name] MRN: [id number] Date of Birth: May 28, 1956

## 2015-11-14 ENCOUNTER — Ambulatory Visit: Payer: Medicare Other | Admitting: Internal Medicine

## 2015-11-15 ENCOUNTER — Ambulatory Visit: Payer: Medicare Other | Admitting: Physical Therapy

## 2015-11-15 DIAGNOSIS — R531 Weakness: Secondary | ICD-10-CM | POA: Diagnosis not present

## 2015-11-15 DIAGNOSIS — R2681 Unsteadiness on feet: Secondary | ICD-10-CM | POA: Diagnosis not present

## 2015-11-15 DIAGNOSIS — R269 Unspecified abnormalities of gait and mobility: Secondary | ICD-10-CM | POA: Diagnosis not present

## 2015-11-15 DIAGNOSIS — Z7409 Other reduced mobility: Secondary | ICD-10-CM | POA: Diagnosis not present

## 2015-11-15 NOTE — Therapy (Signed)
[facility] [address] [place], [place], [address] Phone: [phone]   Fax:  [phone]  Physical Therapy Treatment  Patient Details  Name: Jamie [name] MRN: [id number] Date of Birth: 14-Aug-1956 Referring Provider: Alysia Penna, MD  Encounter Date: 11/15/2015      PT End of Session - 11/15/15 2243    Visit Number 10   Number of Visits 17   Date for PT Re-Evaluation 12/11/15   Authorization Type Medicare - GCodes   PT Start Time 9935   PT Stop Time 1015   PT Time Calculation (min) 41 min   Equipment Utilized During Treatment Gait belt   Activity Tolerance Patient tolerated treatment well   Behavior During Therapy [facility] for tasks assessed/performed      Past Medical History  Diagnosis Date  . 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 Progressive gait disorder     Was treated for probable MS until diagnosed with Neurosarcoidosis about 10 years ago.   . Thyroid disease   . Depression     No past surgical history on file.  There were no vitals filed for this visit.  Visit Diagnosis:  Abnormality of gait  Unsteadiness      Subjective Assessment - 11/15/15 0942    Subjective had a good Thanksgiving with family; no falls   Currently in Pain? Yes   Pain Score 3    Pain Location Sacrum   Pain Orientation Lower;Mid   Pain Descriptors / Indicators Burning   Pain Type Neuropathic pain   Pain Onset More than a month ago   Aggravating Factors  sitting too long   Pain Relieving Factors standing and walking   Effect of Pain on Daily Activities PT will monitor pain, but will not be directly addressed by PT at this time due to nature of neuropathic pain associated with sacral nerve injury.                         [place] [facility] Adult PT Treatment/Exercise - 11/15/15 0946    Ambulation/Gait   Ambulation/Gait Yes   Ambulation/Gait Assistance 5: Supervision   Ambulation Distance (Feet) 120 Feet  x 2   Assistive device Rolling walker   Gait Pattern Step-through pattern;Decreased step length - right;Decreased step length - left   Ambulation Surface Level;Indoor   Gait Comments In parallel bars, attempted forward/back walking x  reps with 1 UE support at bar and 1 HHA.  Pt has increased lean to R side x 2 reps, with therapist helping pt to regain balance.  Pt's caregiver initially questions whether pt can do this at her ILF at the hallway rail.  Explained to patient/caregiver that she is not safe with ambulation/gait activities without bilateral UE support.   High Level Balance   High Level Balance Activities Side stepping;Backward walking;Marching forwards;Marching backwards  Forward walking in parallel bars, 3 reps x 10 ft   High Level Balance Comments Standing shoulder width apart, with alternating UE reaches, bilateral UE reaches (with min guard assistanc), marching in place 2 sets of 10 reps, ankle/hip strategies in parallel bars-heel/toe raises then hip flex/extension (wall bumps) 2 sets x 10 reps each.  Cues given for upright posture and lessening UE support   Knee/Hip Exercises: Aerobic   Nustep 3 minutes, 4 extremities (pt sitting on pillow),  level 3, did not continue beyond 3 minutes due to sacral pain     Standing in parallel bars on compliant beam:  Marching in place, forward kicks, forward step taps x 10 reps each with UE support and min guard assistance, for improved dynamic balance.             PT Short Term Goals - 11/08/15 1224    PT SHORT TERM GOAL #1   Title Pt will perform HEP with min A of caregiver to maximize functional gains made in PT. Target date: 11/09/15   Baseline Revised from independent to min A of caregiver due to visual impairments, inconsistent carryover.   Status Achieved   PT SHORT TERM GOAL #2   Title Complete Berg and  improve score by 4 points from baseline to indicate improved functional standing balance. Target date: 11/09/15   Baseline Berg not appropriate due pt inability to stand statically without UE support   Status Deferred   PT SHORT TERM GOAL #3   Title Pt will perform sit to/from stand x5 reps with supervision with no AD and minimal UE use to indicate improved functional LE strength. Target date: 11/09/15   Status Achieved   PT SHORT TERM GOAL #4   Title Pt will improve gait velocity from .21 ft/sec to .51 ft/sec to indicate increased efficiency of ambulation. Target date: 11/09/15   Status Achieved   PT SHORT TERM GOAL #5   Title Pt will ambulate 100' consecutively with no rest breaks using LRAD to indicate significant improvement in activity tolerance. Target date: 11/09/15   Baseline Met 11/11.   Status Achieved           PT Long Term Goals - 11/08/15 1224    PT LONG TERM GOAL #1   Title Pt will verbalize understanding of fall prevention strategies to decrease fall risk within home environment. Target date: 12/07/15   Time 8   Period Weeks   Status New   PT LONG TERM GOAL #2   Title Pt will increase gait velocity from .21 ft/sec to .81 ft/sec to indicate increased efficiency of ambulation. Target date: 12/07/15   Time 8   Period Weeks   Status New   PT LONG TERM GOAL #3   Title Pt will improve Berg score by 8 points from baseline to indicate significant improvement in functional standing balance. Target date: 12/07/15   Baseline Berg not appropriate due to pt inability to stand statically without UE support   Status Deferred   PT LONG TERM GOAL #4   Title Pt will ambulate 150' over level surfaces with mod I using LRAD to indicate increased independence with household mobility, progress toward PLOF. Target date: 12/07/15   Time 8   Period Weeks   Status New   PT LONG TERM GOAL #5   Title Pt will ambulate 200' over unlevel, paved surfaces with supervision using LRAD to enable pt  to safely walk from car to/from doctor's office with caregiver. Target date: 12/07/15   Time 8   Period Weeks   Status New   PT LONG TERM GOAL #6   Title Pt will perform all above aspects of mobility < / = 2-point increase in within-session pain rating to indicate decreased impact of pain on functional activity tolerance. Target date: 12/07/15   Time 8   Period Weeks   Status New               Plan - 11/15/15 2244  Clinical Impression Statement Pt works on balance activities today in parallel bars, with cues for posture and attempting to decrease UE support.  Pt has increased difficulty with 1 UE support at parallel bars and is not yet safe to perform standing balance/gait activities at ILF with less than bilateral UE support.  Pt will continue to benefit from further skilled PT to address balance, gait.   Pt will benefit from skilled therapeutic intervention in order to improve on the following deficits Abnormal gait;Decreased endurance;Decreased coordination;Decreased balance;Decreased strength;Decreased mobility;Impaired tone;Postural dysfunction;Pain;Impaired sensation;Impaired flexibility;Decreased activity tolerance   Clinical Impairments Affecting Rehab Potential legal blindness   PT Frequency 2x / week   PT Duration 8 weeks   PT Treatment/Interventions Gait training;Functional mobility training;Therapeutic activities;Therapeutic exercise;Balance training;Patient/family education;Neuromuscular re-education;Manual techniques;Vestibular;DME Instruction   PT Next Visit Plan gait and balance training in parallel bars; trunk control activities.     Consulted and Agree with Plan of Care Patient;Family member/caregiver   Family Member Consulted caregiver, Lizabeth Leyden - 2015-11-28 [id number]    Functional Assessment Tool Used gait velocity 0.67 ft/sec, Berg Balance score 18/56   Functional Limitation Mobility: Walking and moving around   Mobility: Walking and Moving Around  Current Status [phone]) At least 80 percent but less than 100 percent impaired, limited or restricted   Mobility: Walking and Moving Around Goal Status [phone]) At least 80 percent but less than 100 percent impaired, limited or restricted      Problem List Patient Active Problem List   Diagnosis Date Noted  . 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 Neurogenic bowel 05/24/2015  . 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 Hypotonic neurogenic bladder   . Leg weakness   . Sacral fracture, closed ([place]) 05/21/2015  . Neurosarcoidosis ([place]) 05/21/2015    Eyad Rochford W. Nov 28, 2015, 10:47 PM Frazier Butt., PT [facility] [address] [place], [place], [address] Phone: [phone]   Fax:  [phone]  Name: Roseland Braun MRN: [id number] Date of Birth: 29-Oct-1956

## 2015-11-17 ENCOUNTER — Ambulatory Visit: Payer: Medicare Other | Attending: Physical Medicine & Rehabilitation | Admitting: Physical Therapy

## 2015-11-17 DIAGNOSIS — R269 Unspecified abnormalities of gait and mobility: Secondary | ICD-10-CM | POA: Insufficient documentation

## 2015-11-17 DIAGNOSIS — R531 Weakness: Secondary | ICD-10-CM | POA: Diagnosis not present

## 2015-11-17 DIAGNOSIS — R2681 Unsteadiness on feet: Secondary | ICD-10-CM | POA: Diagnosis not present

## 2015-11-18 NOTE — Therapy (Signed)
[facility] [address] [place], [place], [address] Phone: [phone]   Fax:  [phone]  Physical Therapy Treatment  Patient Details  Name: Jamie [name] MRN: [id number] Date of Birth: 1956/10/02 Referring Provider: Alysia Penna, MD  Encounter Date: 11/17/2015      PT End of Session - 11/18/15 0739    Visit Number 11   Number of Visits 17   Date for PT Re-Evaluation 12/11/15   Authorization Type Medicare - GCodes   PT Start Time [phone]  pt arrives late   PT Stop Time 1016   PT Time Calculation (min) 34 min   Equipment Utilized During Treatment Gait belt   Activity Tolerance Patient tolerated treatment well   Behavior During Therapy [facility] for tasks assessed/performed      Past Medical History  Diagnosis Date  . 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 Progressive gait disorder     Was treated for probable MS until diagnosed with Neurosarcoidosis about 10 years ago.   . Thyroid disease   . Depression     No past surgical history on file.  There were no vitals filed for this visit.  Visit Diagnosis:  Abnormality of gait  Unsteadiness      Subjective Assessment - 11/17/15 0946    Subjective No changes, no falls since last visit.  Pt and caregiver report difficulty negotiating outdoor surfaces.  Pt arrives late today   Currently in Pain? Yes   Pain Score 3    Pain Location Sacrum   Pain Orientation Lower;Mid   Pain Descriptors / Indicators Burning   Pain Type Neuropathic pain   Pain Onset More than a month ago   Pain Frequency Intermittent   Aggravating Factors  sitting too long, bending down   Pain Relieving Factors standing and walking   Multiple Pain Sites No                         [facility] Adult PT Treatment/Exercise - 11/17/15 0950    Ambulation/Gait   Ambulation/Gait Yes   Ambulation/Gait Assistance 4: Min guard   Ambulation/Gait Assistance Details Outdoor gait activities today, including incline/decline of sidewalk near curb-cut using RW.  PT provides cues for upright posture, slowed deliberate gait with heelstrike as she is negotiating declines; PT provides cues for increased momentum to negotiate inclines.   Ambulation Distance (Feet) 120 Feet  , then 200 ft outdoors   Assistive device Rolling walker   Gait Pattern Step-through pattern;Decreased step length - right;Decreased step length - left  Increased time; decr. step length on outdoor surfaces   Ambulation Surface Level;Indoor;Outdoor;Paved  sidewalk   Gait Comments In parallel bars:  compliant surface work standing on compliant beam:  marching in place, forward kicks, forward step taps x 10 reps each with UE support and min guard assistance.  Caregiver asks when/how she can do these activities at home.  PT discussed simulating compliant surfaces with pillow/corner balance activities at next session to try to give for HEP.                PT Education - 11/18/15 0739    Education provided Yes   Education Details Safe negotiation of inclines/declines and outdoor surfaces using RW   Person(s) Educated Patient;Caregiver(s)   Methods Explanation;Demonstration   Comprehension Verbalized understanding;Returned  demonstration          PT Short Term Goals - 11/08/15 1224    PT SHORT TERM GOAL #1   Title Pt will perform HEP with min A of caregiver to maximize functional gains made in PT. Target date: 11/09/15   Baseline Revised from independent to min A of caregiver due to visual impairments, inconsistent carryover.   Status Achieved   PT SHORT TERM GOAL #2   Title Complete Berg and improve score by 4 points from baseline to indicate improved functional standing balance. Target date: 11/09/15   Baseline Berg not appropriate due pt inability to stand statically without UE  support   Status Deferred   PT SHORT TERM GOAL #3   Title Pt will perform sit to/from stand x5 reps with supervision with no AD and minimal UE use to indicate improved functional LE strength. Target date: 11/09/15   Status Achieved   PT SHORT TERM GOAL #4   Title Pt will improve gait velocity from .21 ft/sec to .51 ft/sec to indicate increased efficiency of ambulation. Target date: 11/09/15   Status Achieved   PT SHORT TERM GOAL #5   Title Pt will ambulate 100' consecutively with no rest breaks using LRAD to indicate significant improvement in activity tolerance. Target date: 11/09/15   Baseline Met 11/11.   Status Achieved           PT Long Term Goals - 11/08/15 1224    PT LONG TERM GOAL #1   Title Pt will verbalize understanding of fall prevention strategies to decrease fall risk within home environment. Target date: 12/07/15   Time 8   Period Weeks   Status New   PT LONG TERM GOAL #2   Title Pt will increase gait velocity from .21 ft/sec to .81 ft/sec to indicate increased efficiency of ambulation. Target date: 12/07/15   Time 8   Period Weeks   Status New   PT LONG TERM GOAL #3   Title Pt will improve Berg score by 8 points from baseline to indicate significant improvement in functional standing balance. Target date: 12/07/15   Baseline Berg not appropriate due to pt inability to stand statically without UE support   Status Deferred   PT LONG TERM GOAL #4   Title Pt will ambulate 150' over level surfaces with mod I using LRAD to indicate increased independence with household mobility, progress toward PLOF. Target date: 12/07/15   Time 8   Period Weeks   Status New   PT LONG TERM GOAL #5   Title Pt will ambulate 200' over unlevel, paved surfaces with supervision using LRAD to enable pt to safely walk from car to/from doctor's office with caregiver. Target date: 12/07/15   Time 8   Period Weeks   Status New   PT LONG TERM GOAL #6   Title Pt will perform all above aspects  of mobility < / = 2-point increase in within-session pain rating to indicate decreased impact of pain on functional activity tolerance. Target date: 12/07/15   Time 8   Period Weeks   Status New               Plan - 11/18/15 0740    Clinical Impression Statement Focus of skilled PT session today was on outdoor gait and incline/decline negotiation on sidewalks using RW, per request of patient and caregiver.  Pt demonstrates and notes improved confidence with negoitation of slight ramp incline and decline on sidewalks with verbal cues for  improved technique.  Pt  noted to have decreased step length on outdoor surfaces, likely due in part to decreased vision and anticipation of unlevel surfaces.   Pt will benefit from skilled therapeutic intervention in order to improve on the following deficits Abnormal gait;Decreased endurance;Decreased coordination;Decreased balance;Decreased strength;Decreased mobility;Impaired tone;Postural dysfunction;Pain;Impaired sensation;Impaired flexibility;Decreased activity tolerance   Clinical Impairments Affecting Rehab Potential legal blindness   PT Frequency 2x / week   PT Duration 8 weeks   PT Treatment/Interventions Gait training;Functional mobility training;Therapeutic activities;Therapeutic exercise;Balance training;Patient/family education;Neuromuscular re-education;Manual techniques;Vestibular;DME Instruction   PT Next Visit Plan trunk control activities/use of abdominal activation during gait and balance activities; compliant surface work-try corner balance activities to add to HEP   Consulted and Agree with Plan of Care Patient;Family member/caregiver   Family Member Consulted caregiver, Beverlee Nims        Problem List Patient Active Problem List   Diagnosis Date Noted  . 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 Neurogenic bowel 05/24/2015  . 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 Hypotonic neurogenic bladder   . Leg weakness   . Sacral fracture,  closed ([place]) 05/21/2015  . Neurosarcoidosis ([place]) 05/21/2015    Mckinzy Fuller W. 11/18/2015, 7:43 AM Frazier Butt., PT  [facility] [address] [place], [place], [address] Phone: [phone]   Fax:  [phone]  Name: Jaidah Lomax MRN: [id number] Date of Birth: Jan 03, 1956

## 2015-11-22 ENCOUNTER — Encounter: Payer: Medicare Other | Attending: Physical Medicine & Rehabilitation

## 2015-11-22 ENCOUNTER — Ambulatory Visit (HOSPITAL_BASED_OUTPATIENT_CLINIC_OR_DEPARTMENT_OTHER): Payer: Medicare Other | Admitting: Physical Medicine & Rehabilitation

## 2015-11-22 ENCOUNTER — Encounter: Payer: Self-pay | Admitting: Physical Medicine & Rehabilitation

## 2015-11-22 VITALS — BP 94/58 | HR 70 | Resp 14

## 2015-11-22 DIAGNOSIS — K592 Neurogenic bowel, not elsewhere classified: Secondary | ICD-10-CM

## 2015-11-22 DIAGNOSIS — N312 Flaccid neuropathic bladder, not elsewhere classified: Secondary | ICD-10-CM

## 2015-11-22 DIAGNOSIS — N319 Neuromuscular dysfunction of bladder, unspecified: Secondary | ICD-10-CM | POA: Insufficient documentation

## 2015-11-22 DIAGNOSIS — Z8249 Family history of ischemic heart disease and other diseases of the circulatory system: Secondary | ICD-10-CM | POA: Diagnosis not present

## 2015-11-22 DIAGNOSIS — S3422XA Injury of nerve root of sacral spine, initial encounter: Secondary | ICD-10-CM | POA: Diagnosis not present

## 2015-11-22 DIAGNOSIS — S3422XS Injury of nerve root of sacral spine, sequela: Secondary | ICD-10-CM

## 2015-11-22 NOTE — Progress Notes (Signed)
Subjective:    Patient ID: Jamie [name], female    DOB: 11/01/1956, 59 y.o.   MRN: [id number] 05/20/15 fall, MRI Lumbar spine showed sacral fracture S1-S2 with mild angulation and some edema and hemorrhage in the area HPI Incont of bowel and bladder Living at [facility], no further falls Bathes with sup and dresses indep   Pain Inventory Average Pain 3 Pain Right Now 3 My pain is burning  In the last 24 hours, has pain interfered with the following? General activity 0 Relation with others 0 Enjoyment of life 0 What TIME of day is your pain at its worst? daytime, night  Sleep (in general) Good  Pain is worse with: bending Pain improves with: other Relief from Meds: no selection  Mobility walk with assistance use a walker how many minutes can you walk? 10-15 ability to climb steps?  yes do you drive?  no use a wheelchair Do you have any goals in this area?  yes  Function disabled: date disabled .  Neuro/Psych bladder control problems bowel control problems  Prior Studies Any changes since last visit?  no  Physicians involved in your care Any changes since last visit?  no   Family History  Problem Relation Age of Onset  . Hypertension Father    Social History   Social History  . Marital Status: Single    Spouse Name: N/A  . Number of Children: N/A  . Years of Education: N/A   Social History Main Topics  . Smoking status: Never Smoker   . Smokeless tobacco: None  . Alcohol Use: No  . Drug Use: No  . Sexual Activity: Not Asked   Other Topics Concern  . None   Social History Narrative   History reviewed. No pertinent past surgical history. Past Medical History  Diagnosis Date  . 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 Progressive gait disorder     Was treated for probable MS until diagnosed with  Neurosarcoidosis about 10 years ago.   . Thyroid disease   . Depression    BP 94/58 mmHg  Pulse 70  Resp 14  SpO2 100%  Opioid Risk Score:   Fall Risk Score:  `1  Depression screen PHQ 2/9  Depression screen [facility] 2/9 07/12/2015 07/12/2015  Decreased Interest 0 0  Down, Depressed, Hopeless 0 0  PHQ - 2 Score 0 0  Altered sleeping 0 -  Tired, decreased energy 1 -  Change in appetite 0 -  Feeling bad or failure about yourself  0 -  Trouble concentrating 0 -  Moving slowly or fidgety/restless 3 -  Suicidal thoughts 0 -  PHQ-9 Score 4 -     Review of Systems  Gastrointestinal:       Bowel control problems   Genitourinary:       Bladder control problems   All other systems reviewed and are negative.      Objective:   Physical Exam  Constitutional: She is oriented to person, place, and time. She appears well-developed and well-nourished.  HENT:  Head: Normocephalic and atraumatic.  Eyes: Conjunctivae and EOM are normal. Pupils are equal, round, and reactive to light.  Neck: Normal range of motion.  Neurological: She is alert and oriented to person, place, and time.  Reflex Scores:      Patellar reflexes are 2+  on the right side and 2+ on the left side.      Achilles reflexes are 0 on the right side and 1+ on the left side. 5/5 Hip flexors, knee extensors 4 minus ankle dorsiflexor, 3 minus ankle plantar flexor and toe flexor bilaterally  Sensation reduced over the sacral area mildly reduced in the posterior thigh Patient is able to identify light touch in bilateral small toes  Psychiatric: She has a normal mood and affect.  Nursing note and vitals reviewed.         Assessment & Plan:  1. Sacral fracture with sacral nerve root injury appears to be Multilevel involving S1-S2 S3-S4 and potentially S5  Patient still has motor weakness in the toe flexors and ankle plantar flexors. In addition she has decreased sensation in the sacral dermatomes of the feet. In  addition she has perineal numbness As well as neurogenic bowel bladder.  Follow-up with urology 12/02/2015.  Continue Outpatient PT OT  Physical medicine and rehabilitation follow-up in 6 months

## 2015-11-22 NOTE — Patient Instructions (Signed)
Please do home exercise program every day

## 2015-11-23 ENCOUNTER — Ambulatory Visit: Payer: Medicare Other | Admitting: Physical Therapy

## 2015-11-23 DIAGNOSIS — R269 Unspecified abnormalities of gait and mobility: Secondary | ICD-10-CM | POA: Diagnosis not present

## 2015-11-23 DIAGNOSIS — R531 Weakness: Secondary | ICD-10-CM | POA: Diagnosis not present

## 2015-11-23 DIAGNOSIS — R2681 Unsteadiness on feet: Secondary | ICD-10-CM | POA: Diagnosis not present

## 2015-11-23 NOTE — Therapy (Signed)
[facility] [address] [place], [place], [address] Phone: [phone]   Fax:  [phone]  Physical Therapy Treatment  Patient Details  Name: Jamie [name] MRN: [id number] Date of Birth: July 17, 1956 Referring Provider: Alysia Penna, MD  Encounter Date: 11/23/2015      PT End of Session - 11/23/15 2123    Visit Number 12   Number of Visits 17   Date for PT Re-Evaluation 12/11/15   Authorization Type Medicare - GCodes   PT Start Time [phone]   PT Stop Time 1024   PT Time Calculation (min) 46 min   Equipment Utilized During Treatment Gait belt   Activity Tolerance Patient tolerated treatment well   Behavior During Therapy [facility] for tasks assessed/performed      Past Medical History  Diagnosis Date  . 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 Progressive gait disorder     Was treated for probable MS until diagnosed with Neurosarcoidosis about 10 years ago.   . Thyroid disease   . Depression     No past surgical history on file.  There were no vitals filed for this visit.  Visit Diagnosis:  Unsteadiness  Abnormality of gait      Subjective Assessment - 11/23/15 0942    Subjective No changes, no falls.  Went to see Dr. Letta Pate yesterday-he said healing goes slowly, will continue to take time.   Currently in Pain? Yes   Pain Score 3    Pain Location Sacrum   Pain Orientation Mid   Pain Descriptors / Indicators Burning   Pain Type Neuropathic pain   Pain Onset More than a month ago   Pain Frequency Intermittent   Aggravating Factors  sitting too long, bending down   Pain Relieving Factors standing and walking   Effect of Pain on Daily Activities PT will monitor pain, but will not directly assess due to neuropathic nature of pain.                         [facility] Adult PT  Treatment/Exercise - 11/23/15 0001    Ambulation/Gait   Ambulation/Gait Yes   Ambulation/Gait Assistance 5: Supervision   Ambulation/Gait Assistance Details Pt takes increased time for gait.   Ambulation Distance (Feet) 120 Feet  40x 2, then 100   Assistive device Rolling walker   Gait Pattern Step-through pattern;Decreased step length - right;Decreased step length - left   Ambulation Surface Level;Indoor   Gait Comments Pt with improved weightshifting and improved step length this visit.             Balance Exercises - 11/23/15 2119    Balance Exercises: Standing   Standing Eyes Opened Wide (BOA);Narrow base of support (BOS);Head turns;Solid surface;Foam/compliant surface;5 reps  Head nods, on pillow with walker for support   Standing Eyes Closed Wide (BOA);Narrow base of support (BOS);Solid surface;Foam/compliant surface;1 rep;10 secs  with UE support at walker   Other Standing Exercises Standing on pillow with UE support at walker:  marching in place x 10 reps, then forward kicks x 10 reps    Added the above exercises to HEP-educated patient and caregiver in HEP written instructions.       PT Education - 11/23/15 2122    Education provided Yes   Education Details Corner balance exercises added to  HEP-see instructions   Person(s) Educated Patient;Caregiver(s)   Methods Explanation;Demonstration;Handout   Comprehension Verbalized understanding;Returned demonstration          PT Short Term Goals - 11/08/15 1224    PT SHORT TERM GOAL #1   Title Pt will perform HEP with min A of caregiver to maximize functional gains made in PT. Target date: 11/09/15   Baseline Revised from independent to min A of caregiver due to visual impairments, inconsistent carryover.   Status Achieved   PT SHORT TERM GOAL #2   Title Complete Berg and improve score by 4 points from baseline to indicate improved functional standing balance. Target date: 11/09/15   Baseline Berg not appropriate due  pt inability to stand statically without UE support   Status Deferred   PT SHORT TERM GOAL #3   Title Pt will perform sit to/from stand x5 reps with supervision with no AD and minimal UE use to indicate improved functional LE strength. Target date: 11/09/15   Status Achieved   PT SHORT TERM GOAL #4   Title Pt will improve gait velocity from .21 ft/sec to .51 ft/sec to indicate increased efficiency of ambulation. Target date: 11/09/15   Status Achieved   PT SHORT TERM GOAL #5   Title Pt will ambulate 100' consecutively with no rest breaks using LRAD to indicate significant improvement in activity tolerance. Target date: 11/09/15   Baseline Met 11/11.   Status Achieved           PT Long Term Goals - 11/08/15 1224    PT LONG TERM GOAL #1   Title Pt will verbalize understanding of fall prevention strategies to decrease fall risk within home environment. Target date: 12/07/15   Time 8   Period Weeks   Status New   PT LONG TERM GOAL #2   Title Pt will increase gait velocity from .21 ft/sec to .81 ft/sec to indicate increased efficiency of ambulation. Target date: 12/07/15   Time 8   Period Weeks   Status New   PT LONG TERM GOAL #3   Title Pt will improve Berg score by 8 points from baseline to indicate significant improvement in functional standing balance. Target date: 12/07/15   Baseline Berg not appropriate due to pt inability to stand statically without UE support   Status Deferred   PT LONG TERM GOAL #4   Title Pt will ambulate 150' over level surfaces with mod I using LRAD to indicate increased independence with household mobility, progress toward PLOF. Target date: 12/07/15   Time 8   Period Weeks   Status New   PT LONG TERM GOAL #5   Title Pt will ambulate 200' over unlevel, paved surfaces with supervision using LRAD to enable pt to safely walk from car to/from doctor's office with caregiver. Target date: 12/07/15   Time 8   Period Weeks   Status New   PT LONG TERM GOAL  #6   Title Pt will perform all above aspects of mobility < / = 2-point increase in within-session pain rating to indicate decreased impact of pain on functional activity tolerance. Target date: 12/07/15   Time 8   Period Weeks   Status New               Plan - 11/23/15 2124    Clinical Impression Statement Additions made to HEP today for corner balance exercises to address improved balance.  Patient and caregiver interested in having additions to HEP.  With compliant surface activities,  pt safer when using light support at walker.  pt will conitnue to benefit from further skilled physical therapy to improve functional mobiliy, gait and transfers and balance.   Pt will benefit from skilled therapeutic intervention in order to improve on the following deficits Abnormal gait;Decreased endurance;Decreased coordination;Decreased balance;Decreased strength;Decreased mobility;Impaired tone;Postural dysfunction;Pain;Impaired sensation;Impaired flexibility;Decreased activity tolerance   Clinical Impairments Affecting Rehab Potential legal blindness   PT Frequency 2x / week   PT Duration 8 weeks   PT Treatment/Interventions Gait training;Functional mobility training;Therapeutic activities;Therapeutic exercise;Balance training;Patient/family education;Neuromuscular re-education;Manual techniques;Vestibular;DME Instruction   PT Next Visit Plan Review corner balance activities, continue to work on balance-single limb stance, tandem standing activities   Consulted and Agree with Plan of Care Patient;Family member/caregiver   Family Member Consulted caregiver, Beverlee Nims        Problem List Patient Active Problem List   Diagnosis Date Noted  . 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 Neurogenic bowel 05/24/2015  . 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 Hypotonic neurogenic bladder   . Leg weakness   . Sacral fracture, closed ([place]) 05/21/2015  . Neurosarcoidosis ([place]) 05/21/2015    Dashanti Burr  W. 11/23/2015, 9:28 PM  Frazier Butt., PT  [facility] [address] [place], [place], [address] Phone: [phone]   Fax:  [phone]  Name: Jamie [name] MRN: [id number] Date of Birth: 1956-11-09

## 2015-11-23 NOTE — Patient Instructions (Signed)
WITH ALL OF THESE EXERCISES, STAND IN A CORNER WITH YOUR WALKER OR A CHAIR IN FRONT  Feet Apart, Head Motion - Eyes Open    With eyes open, feet apart, move head slowly: up and down 5 times, then move head side to side 5 times. Do __1-2__ sessions per day.  Copyright  VHI. All rights reserved.  Feet Apart, Varied Arm Positions - Eyes Closed    Stand with feet shoulder width apart and arms by your side or holding onto the walker lightly. Close eyes and visualize upright position. Hold __10__ seconds. Repeat __3__ times per session. Do __1-2__ sessions per day.  Copyright  VHI. All rights reserved.  Feet Together, Head Motion - Eyes Open    With eyes open, feet together, move head slowly: up and down 5 times, then move head side to side 5 times.  Do 1-2____ sessions per day.  Copyright  VHI. All rights reserved.  Feet Together, Varied Arm Positions - Eyes Closed    Stand with feet together and arms by your side or holding onto the walker. Close eyes and visualize upright position. Hold 10___ seconds. Repeat __3__ times per session. Do __1-2_ sessions per day.  Copyright  VHI. All rights reserved.  Feet Apart (Compliant Surface) Head Motion - Eyes Open    With eyes open, standing on compliant surface: __pillow______, feet shoulder width apart, move head slowly: up and down 5 times, then side to side 5 times  Do _1-2___ sessions per day.  Copyright  VHI. All rights reserved.  Feet Apart (Compliant Surface) Varied Arm Positions - Eyes Closed    Stand on compliant surface: _pillow or towel_______ with feet shoulder width apart and arms at your side or holding onto walker. Close eyes and visualize upright position. Hold_10__ seconds. Repeat _3_ times per session. Do __1-2__ sessions per day.  Copyright  VHI. All rights reserved.  Feet Together (Compliant Surface) Head Motion - Eyes Open    With eyes open, standing on compliant surface: _pillow or towel__, feet  together, move head slowly: up and down 5 times, then side to side 5 times. Do _1-2___ sessions per day.  Copyright  VHI. All rights reserved.  Feet Together (Compliant Surface) Varied Arm Positions - Eyes Closed    Stand on compliant surface: ____pillow or towel____ with feet together and arms at your side or holding onto the walker. Close eyes and visualize upright position. Hold_10___ seconds. Repeat __3_ times per session. Do _1-2___ sessions per day.  Copyright  VHI. All rights reserved.    Standing on your pillow with the walker in front of you:    -Alternating your legs, March in place 10 times  -Alternate your legs, kicking forward 10 times

## 2015-11-24 ENCOUNTER — Ambulatory Visit (INDEPENDENT_AMBULATORY_CARE_PROVIDER_SITE_OTHER): Payer: Medicare Other | Admitting: Internal Medicine

## 2015-11-24 ENCOUNTER — Encounter: Payer: Self-pay | Admitting: Internal Medicine

## 2015-11-24 VITALS — BP 118/62 | HR 84 | Temp 98.2°F | Resp 16 | Ht 64.0 in | Wt 130.0 lb

## 2015-11-24 DIAGNOSIS — E039 Hypothyroidism, unspecified: Secondary | ICD-10-CM

## 2015-11-24 DIAGNOSIS — R29898 Other symptoms and signs involving the musculoskeletal system: Secondary | ICD-10-CM | POA: Diagnosis not present

## 2015-11-24 DIAGNOSIS — Z1231 Encounter for screening mammogram for malignant neoplasm of breast: Secondary | ICD-10-CM | POA: Diagnosis not present

## 2015-11-24 NOTE — Progress Notes (Signed)
Pre visit review using our clinic review tool, if applicable. No additional management support is needed unless otherwise documented below in the visit note. 

## 2015-11-24 NOTE — Patient Instructions (Signed)
We will put in the order for the mammogram today and you can call and schedule at the breast center or they will call you.   We would like you to come back some Monday to see if we can check your blood work for the thyroid.

## 2015-11-25 ENCOUNTER — Ambulatory Visit: Payer: Medicare Other | Admitting: Physical Therapy

## 2015-11-25 DIAGNOSIS — R269 Unspecified abnormalities of gait and mobility: Secondary | ICD-10-CM | POA: Diagnosis not present

## 2015-11-25 DIAGNOSIS — R531 Weakness: Secondary | ICD-10-CM | POA: Diagnosis not present

## 2015-11-25 DIAGNOSIS — R2681 Unsteadiness on feet: Secondary | ICD-10-CM | POA: Diagnosis not present

## 2015-11-25 NOTE — Therapy (Signed)
[facility] [address] [place], [place], [address] Phone: [phone]   Fax:  [phone]  Physical Therapy Treatment  Patient Details  Name: Jamie [name] MRN: [id number] Date of Birth: 02-27-1956 Referring Provider: Alysia Penna, MD  Encounter Date: 11/25/2015      PT End of Session - 11/25/15 1216    Visit Number 13   Number of Visits 17   Date for PT Re-Evaluation 12/11/15   Authorization Type Medicare - GCodes   PT Start Time 0931   PT Stop Time 1012   PT Time Calculation (min) 41 min   Activity Tolerance Patient tolerated treatment well   Behavior During Therapy [facility] for tasks assessed/performed      Past Medical History  Diagnosis Date  . 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  . 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 Progressive gait disorder     Was treated for probable MS until diagnosed with Neurosarcoidosis about 10 years ago.   . Thyroid disease   . Depression     No past surgical history on file.  There were no vitals filed for this visit.  Visit Diagnosis:  Unsteadiness  Abnormality of gait      Subjective Assessment - 11/25/15 0936    Subjective No changes-saw new MD yesterday to establish primary care.   Currently in Pain? Yes   Pain Score 2    Pain Location Sacrum   Pain Orientation Mid   Pain Descriptors / Indicators Burning   Pain Type Neuropathic pain   Pain Radiating Towards sometimes radiating pain   Pain Onset More than a month ago   Pain Frequency Intermittent   Aggravating Factors  sitting too long, bending down   Pain Relieving Factors standing and walking   Effect of Pain on Daily Activities PT will monitor pain, but will not directly assess due to neuropathic nature of pain.          Neuro Re-education: -Pt performs standing corner exercises provided last visit as HEP-standing with  feet apart>feet together on solid surface, compliant surface with head turns/nods x 10, then 10 seconds steady head hold with eyes closed,then marching in place, forward kicks x 10 each, with intermittent UE support at walker, with close supervision, occasional min guard assistance.  Pt demonstrates understanding of HEP.  Pt does tend to have episodes of increased posterior lean.    Counter balance activities: -alternating hip kicks side x 10 reps, then back x 10 reps each with UE support at counter.  Cues provided for upright posture during standing exercises -Forward hip kicks x 10 reps, alternating legs, with UE support, marching in place x 10 reps -Single limb stance 3 reps x 10 sec each leg, with 1 UE support, then semi-tandem stance 3 reps x 10 sec with 1 UE support and therapist supervision/min guard -Standing feet apart, feet together with no support x 10 sec each   -Standing with wide BOS, lateral weigthshifting x 10 reps then lateral weightshifting with reaching to cabinet, 2 sets x 10 reps. -Standing with wide BOS, anterior/posterior weightshifting x 10 reps with UE support.    Gait into therapy session with postural cues provided for upright posture and glut activation, x 100 ft with RW and supervision.  Short distance gait with RW during session activities to and from counter, negotiating furniture, turns with supervision and cue for  upright posture.                  PT Short Term Goals - 11/08/15 1224    PT SHORT TERM GOAL #1   Title Pt will perform HEP with min A of caregiver to maximize functional gains made in PT. Target date: 11/09/15   Baseline Revised from independent to min A of caregiver due to visual impairments, inconsistent carryover.   Status Achieved   PT SHORT TERM GOAL #2   Title Complete Berg and improve score by 4 points from baseline to indicate improved functional standing balance. Target date: 11/09/15   Baseline Berg not appropriate due pt  inability to stand statically without UE support   Status Deferred   PT SHORT TERM GOAL #3   Title Pt will perform sit to/from stand x5 reps with supervision with no AD and minimal UE use to indicate improved functional LE strength. Target date: 11/09/15   Status Achieved   PT SHORT TERM GOAL #4   Title Pt will improve gait velocity from .21 ft/sec to .51 ft/sec to indicate increased efficiency of ambulation. Target date: 11/09/15   Status Achieved   PT SHORT TERM GOAL #5   Title Pt will ambulate 100' consecutively with no rest breaks using LRAD to indicate significant improvement in activity tolerance. Target date: 11/09/15   Baseline Met 11/11.   Status Achieved           PT Long Term Goals - 11/08/15 1224    PT LONG TERM GOAL #1   Title Pt will verbalize understanding of fall prevention strategies to decrease fall risk within home environment. Target date: 12/07/15   Time 8   Period Weeks   Status New   PT LONG TERM GOAL #2   Title Pt will increase gait velocity from .21 ft/sec to .81 ft/sec to indicate increased efficiency of ambulation. Target date: 12/07/15   Time 8   Period Weeks   Status New   PT LONG TERM GOAL #3   Title Pt will improve Berg score by 8 points from baseline to indicate significant improvement in functional standing balance. Target date: 12/07/15   Baseline Berg not appropriate due to pt inability to stand statically without UE support   Status Deferred   PT LONG TERM GOAL #4   Title Pt will ambulate 150' over level surfaces with mod I using LRAD to indicate increased independence with household mobility, progress toward PLOF. Target date: 12/07/15   Time 8   Period Weeks   Status New   PT LONG TERM GOAL #5   Title Pt will ambulate 200' over unlevel, paved surfaces with supervision using LRAD to enable pt to safely walk from car to/from doctor's office with caregiver. Target date: 12/07/15   Time 8   Period Weeks   Status New   PT LONG TERM GOAL #6    Title Pt will perform all above aspects of mobility < / = 2-point increase in within-session pain rating to indicate decreased impact of pain on functional activity tolerance. Target date: 12/07/15   Time 8   Period Weeks   Status New               Plan - 11/25/15 1217    Clinical Impression Statement Pt continues to require UE reliance with standing and gait activities.  When using UEs (for blowing nose and handgel, pt leans on counter with hip).  Pt does have posterior lean slightly with corner  balance exercises.  Pt will continue to benefit from further skiled PT to address strength, balance, and gait.   Pt will benefit from skilled therapeutic intervention in order to improve on the following deficits Abnormal gait;Decreased endurance;Decreased coordination;Decreased balance;Decreased strength;Decreased mobility;Impaired tone;Postural dysfunction;Pain;Impaired sensation;Impaired flexibility;Decreased activity tolerance   Clinical Impairments Affecting Rehab Potential legal blindness   PT Frequency 2x / week   PT Duration 8 weeks   PT Treatment/Interventions Gait training;Functional mobility training;Therapeutic activities;Therapeutic exercise;Balance training;Patient/family education;Neuromuscular re-education;Manual techniques;Vestibular;DME Instruction   PT Next Visit Plan Continue to work on balance-single limb stance, tandem standing activities, weigthshifting at counter with less UE support; begin conversation about goal check/possible D/C in next 2 weeks, with discussion on community fitness   Consulted and Agree with Plan of Care Patient;Family member/caregiver   Family Member Consulted caregiver, Beverlee Nims        Problem List Patient Active Problem List   Diagnosis Date Noted  . 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 Neurogenic bowel 05/24/2015  . 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 Hypotonic neurogenic bladder   . Leg weakness   . Sacral fracture, closed ([place])  05/21/2015  . Neurosarcoidosis ([place]) 05/21/2015    MARRIOTT,AMY W. 11/25/2015, 12:20 PM [facility] [address] [place], [place], [address] Phone: [phone]   Fax:  [phone]  Name: Seham Gardenhire MRN: [id number] Date of Birth: Jul 28, 1956

## 2015-11-26 DIAGNOSIS — E039 Hypothyroidism, unspecified: Secondary | ICD-10-CM | POA: Insufficient documentation

## 2015-11-26 NOTE — Progress Notes (Signed)
   Subjective:    Patient ID: Jamie [name], female    DOB: Jan 19, 1956, 59 y.o.   MRN: [id number]  HPI The patient is a 59 YO female coming in for follow up of her thyroid. She is still taking her medicine daily and feels about the same. No tremors, cold or heat intolerance. She does have chronic bowel problems that are not related.   Review of Systems  Constitutional: Positive for activity change and fatigue. Negative for fever, chills, appetite change and unexpected weight change.  HENT: Negative.   Eyes: Negative.   Respiratory: Negative for cough, chest tightness, shortness of breath and wheezing.   Cardiovascular: Negative for chest pain, palpitations and leg swelling.  Gastrointestinal: Negative for abdominal pain, diarrhea, constipation, blood in stool and abdominal distention.       Lack of feeling with incontinence  Musculoskeletal: Positive for myalgias, arthralgias and gait problem.  Skin: Negative.   Neurological: Positive for weakness and numbness. Negative for dizziness, syncope and headaches.  Psychiatric/Behavioral: Negative.       Objective:   Physical Exam  Constitutional: She is oriented to person, place, and time. She appears well-developed. No distress.  With walker  HENT:  Head: Normocephalic and atraumatic.  Eyes: EOM are normal.  Neck: No JVD present. No thyromegaly present.  Cardiovascular: Normal rate and regular rhythm.   Pulmonary/Chest: Effort normal and breath sounds normal. No respiratory distress. She has no wheezes. She has no rales.  Abdominal: Soft. Bowel sounds are normal. She exhibits no distension. There is no tenderness. There is no rebound.  Musculoskeletal: She exhibits no edema.  Neurological: She is alert and oriented to person, place, and time. Coordination abnormal.  Uses walker at home  Skin: Skin is warm and dry.  Psychiatric: She has a normal mood and affect.   Filed Vitals:   11/24/15 1334  BP: 118/62  Pulse: 84  Temp: 98.2 F  (36.8 C)  TempSrc: Oral  Resp: 16  Height: 5\' 4"  (1.626 m)  Weight: 130 lb (58.968 kg)  SpO2: 96%      Assessment & Plan:

## 2015-11-26 NOTE — Assessment & Plan Note (Signed)
Checking TSH and free T4 as not checked in some time. Currently taking synthroid 100 mcg daily and adjust as needed.

## 2015-11-26 NOTE — Assessment & Plan Note (Signed)
Doing better with PT and OT and she is keeping up with the exercises on her own as well.

## 2015-11-29 ENCOUNTER — Ambulatory Visit: Payer: Medicare Other | Admitting: Physical Therapy

## 2015-12-01 ENCOUNTER — Ambulatory Visit: Payer: Medicare Other | Admitting: Physical Therapy

## 2015-12-01 DIAGNOSIS — R2681 Unsteadiness on feet: Secondary | ICD-10-CM | POA: Diagnosis not present

## 2015-12-01 DIAGNOSIS — R531 Weakness: Secondary | ICD-10-CM | POA: Diagnosis not present

## 2015-12-01 DIAGNOSIS — R269 Unspecified abnormalities of gait and mobility: Secondary | ICD-10-CM

## 2015-12-01 NOTE — Therapy (Signed)
[facility] [address] [place], [place], [address] Phone: [phone]   Fax:  [phone]  Physical Therapy Treatment  Patient Details  Name: Jamie [name] MRN: [id number] Date of Birth: 1956-11-02 Referring Provider: Alysia Penna, MD  Encounter Date: 12/01/2015      PT End of Session - 12/01/15 2037    Visit Number 14   Number of Visits 17   Date for PT Re-Evaluation 12/11/15   Authorization Type Medicare - [employer]odes   PT Start Time 0933   PT Stop Time 1015   PT Time Calculation (min) 42 min   Activity Tolerance Patient tolerated treatment well   Behavior During Therapy [facility] for tasks assessed/performed      Past Medical History  Diagnosis Date  . 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 Neurosarcoidosis in adult [facility])     followed at [facility]  . 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 Progressive gait disorder     Was treated for probable MS until diagnosed with Neurosarcoidosis about 10 years ago.   . Thyroid disease   . Depression     No past surgical history on file.  There were no vitals filed for this visit.  Visit Diagnosis:  Abnormality of gait  Unsteadiness  Generalized weakness      Subjective Assessment - 12/01/15 0941    Subjective Pt denies falls and reports no significant changes. 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 Yes   Pain Score 2    Pain Location Sacrum   Pain Orientation Mid   Pain Descriptors / Indicators Burning   Pain Type Neuropathic pain   Pain Radiating Towards sometimes radiating pain   Pain Frequency Intermittent   Aggravating Factors  sitting too long, crossing L leg over R leg to put on shoes   Pain Relieving Factors standing and walking   Effect of Pain on Daily Activities PT will monitor pain, but will not directly address due to neuropathic nature of pain.   Multiple Pain Sites No                         [facility] Adult PT Treatment/Exercise - 12/01/15 0001    Transfers   Transfers Sit to Stand;Stand to Sit   Sit to Stand 5: Supervision   Sit to Stand Details (indicate cue type and reason) Due to pt report of inbility to participate in seated exercise class without being able to use chair without arms, performed blocked practice of sit <> stand from armless chair with supervision, cueing for setup, hand placement, and full anterior wieght shift.   Stand to Sit 5: Supervision   Ambulation/Gait   Ambulation/Gait Yes   Ambulation/Gait Assistance 5: Supervision   Ambulation Distance (Feet) 280 Feet  x80', x50', then x150'   Assistive device Rolling walker   Gait Pattern Step-through pattern;Decreased step length - right;Decreased  step length - left   Ambulation Surface Level;Indoor   Gait velocity 1.17 ft/sec   Gait Comments --   Exercises   Exercises Other Exercises   Other Exercises  Explained and demonstrated safe/proper transfer into NuStep seat, setup of NuStep machine (with exception of turning NuStep seat; recommending gym employee assist with this), adjustment resistance with effective return demo. Pt then performed 8 minutes. Pt utilizing NuStep at end of session with rehab tech standing by to assist pt off NuStep in 5 minutes. Pt and caregiver aware.                PT Education - 12/01/15 2035     Education provided Yes   Education Details Discussed upcoming end of POC; transition to community fitness; [facility]; safe use/setup of NuStep; use of tub transfer bench (as opposed to shower chair) to increase pt safety with getting into/out of tub.   Person(s) Educated Patient;Caregiver(s)   Methods Explanation;Demonstration;Tactile cues;Verbal cues;Handout   Comprehension Verbalized understanding;Returned demonstration          PT Short Term Goals - 11/08/15 1224    PT SHORT TERM GOAL #1   Title Pt will perform HEP with min A of caregiver to maximize functional gains made in PT. Target date: 11/09/15   Baseline Revised from independent to min A of caregiver due to visual impairments, inconsistent carryover.   Status Achieved   PT SHORT TERM GOAL #2   Title Complete Berg and improve score by 4 points from baseline to indicate improved functional standing balance. Target date: 11/09/15   Baseline Berg not appropriate due pt inability to stand statically without UE support   Status Deferred   PT SHORT TERM GOAL #3   Title Pt will perform sit to/from stand x5 reps with supervision with no AD and minimal UE use to indicate improved functional LE strength. Target date: 11/09/15   Status Achieved   PT SHORT TERM GOAL #4   Title Pt will improve gait velocity from .21 ft/sec to .51 ft/sec to indicate increased efficiency of ambulation. Target date: 11/09/15   Status Achieved   PT SHORT TERM GOAL #5   Title Pt will ambulate 100' consecutively with no rest breaks using LRAD to indicate significant improvement in activity tolerance. Target date: 11/09/15   Baseline Met 11/11.   Status Achieved           PT Long Term Goals - 12/01/15 0943    PT LONG TERM GOAL #1   Title Pt will verbalize understanding of fall prevention strategies to decrease fall risk within home environment. Target date: 12/07/15   Time 8   Period Weeks   Status On-going   PT LONG TERM GOAL #2   Title Pt  will increase gait velocity from .21 ft/sec to .81 ft/sec to indicate increased efficiency of ambulation. Target date: 12/07/15   Baseline 1.17 ft/sec   Time 8   Period Weeks   Status Achieved   PT LONG TERM GOAL #3   Title Pt will improve Berg score by 8 points from baseline to indicate significant improvement in functional standing balance. Target date: 12/07/15   Baseline Berg not appropriate due to pt inability to stand statically without UE support   Status Deferred   PT LONG TERM GOAL #4   Title Pt will ambulate 150' over level surfaces with mod I using LRAD to indicate increased independence with household mobility, progress toward PLOF. Target date: 12/07/15   Time 8  Period Weeks   Status New   PT LONG TERM GOAL #5   Title Pt will ambulate 200' over unlevel, paved surfaces with supervision using LRAD to enable pt to safely walk from car to/from doctor's office with caregiver. Target date: 12/07/15   Time 8   Period Weeks   Status New   PT LONG TERM GOAL #6   Title Pt will perform all above aspects of mobility < / = 2-point increase in within-session pain rating to indicate decreased impact of pain on functional activity tolerance. Target date: 12/07/15   Time 8   Period Weeks   Status New               Plan - 12/01/15 2047    Clinical Impression Statement Session focused on addressing transition to community fitness, as pt planned to discontinue PT after POC ends on 12/22. Pt/caregiver initially reluctant to agree to pending D/C; however, with education on available resources for safe exercise, pt and caregiver amenable to this. Caregiver to contact [facility] about membership prior to next PT session. Pt met LTG for gait speed, indicating increased efficiencyof ambulation.   Pt will benefit from skilled therapeutic intervention in order to improve on the following deficits Abnormal gait;Decreased endurance;Decreased coordination;Decreased balance;Decreased  strength;Decreased mobility;Impaired tone;Postural dysfunction;Pain;Impaired sensation;Impaired flexibility;Decreased activity tolerance   Rehab Potential Good   Clinical Impairments Affecting Rehab Potential legal blindness   PT Frequency 2x / week   PT Duration 8 weeks   PT Treatment/Interventions Gait training;Functional mobility training;Therapeutic activities;Therapeutic exercise;Balance training;Patient/family education;Neuromuscular re-education;Manual techniques;Vestibular;DME Instruction   PT Next Visit Plan Continue to work on balance-single limb stance, tandem standing activities, weigthshifting at counter with less UE support.   Consulted and Agree with Plan of Care Patient;Family member/caregiver   Family Member Consulted caregiver, Beverlee Nims        Problem List Patient Active Problem List   Diagnosis Date Noted  . Hypothyroidism 11/26/2015  . 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 Neurogenic bowel 05/24/2015  . 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 Hypotonic neurogenic bladder   . Leg weakness   . Sacral fracture, closed ([place]) 05/21/2015  . Neurosarcoidosis ([place]) 05/21/2015   Billie Ruddy, PT, [place] [address] [place], [place], [address] Phone: [phone]   Fax:  [phone] 12/01/2015, 8:51 PM   Name: Jamie [name] MRN: [id number] Date of Birth: 08-07-1956

## 2015-12-02 ENCOUNTER — Ambulatory Visit: Payer: Medicare Other | Admitting: Physical Therapy

## 2015-12-02 DIAGNOSIS — N3020 Other chronic cystitis without hematuria: Secondary | ICD-10-CM | POA: Diagnosis not present

## 2015-12-02 DIAGNOSIS — N3942 Incontinence without sensory awareness: Secondary | ICD-10-CM | POA: Diagnosis not present

## 2015-12-02 DIAGNOSIS — N393 Stress incontinence (female) (male): Secondary | ICD-10-CM | POA: Diagnosis not present

## 2015-12-06 ENCOUNTER — Ambulatory Visit: Payer: Medicare Other | Admitting: Physical Therapy

## 2015-12-06 DIAGNOSIS — R2681 Unsteadiness on feet: Secondary | ICD-10-CM | POA: Diagnosis not present

## 2015-12-06 DIAGNOSIS — R531 Weakness: Secondary | ICD-10-CM | POA: Diagnosis not present

## 2015-12-06 DIAGNOSIS — R269 Unspecified abnormalities of gait and mobility: Secondary | ICD-10-CM | POA: Diagnosis not present

## 2015-12-07 NOTE — Therapy (Signed)
[facility] [address] [place], [place], [address] Phone: [phone]   Fax:  [phone]  Physical Therapy Treatment  Patient Details  Name: Jamie [name] MRN: [id number] Date of Birth: 01/04/1956 Referring Provider: Alysia Penna, MD  Encounter Date: 12/06/2015      PT End of Session - 12/07/15 0750    Visit Number 15   Number of Visits 17   Date for PT Re-Evaluation 12/11/15   Authorization Type Medicare - GCodes   PT Start Time 0935   PT Stop Time 1018   PT Time Calculation (min) 43 min   Equipment Utilized During Treatment Gait belt   Activity Tolerance Patient tolerated treatment well   Behavior During Therapy [facility] for tasks assessed/performed      Past Medical History  Diagnosis Date  . 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 Neurosarcoidosis in adult [facility])     followed at [facility]  . 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 Progressive gait disorder     Was treated for probable MS until diagnosed with Neurosarcoidosis about 10 years ago.   . Thyroid disease   . Depression     No past surgical history on file.  There were no vitals filed for this visit.  Visit Diagnosis:  Abnormality of gait  Unsteadiness      Subjective Assessment - 12/06/15 0942    Subjective Caregiver has several questions regarding exercises.   Currently in Pain? Yes   Pain Score 3    Pain Location Sacrum   Pain Orientation Mid   Pain Descriptors / Indicators Burning   Pain Type Neuropathic pain   Pain Onset More than a month ago   Aggravating Factors  sitting too long, crossing leg   Pain Relieving Factors standing and walking   Effect of Pain on Daily Activities PT will monitor pain, but will not directly address due to neuropathic nature of pain.   Multiple Pain Sites No                         [facility] Adult PT Treatment/Exercise - 12/06/15 0949    Transfers   Transfers Sit to  Stand;Stand to Sit   Sit to Stand 5: Supervision   Sit to Stand Details (indicate cue type and reason) Pt sitting on pillow, practices sit<>stand from 22 inch mat surface, with cues for forward weightshift for improved transfer.   Stand to Sit 5: Supervision   Number of Reps 10 reps   Comments Reviewed foot position with transfers pt caregiver request for HEP clarification.  Discussed and practiced correct foot position and importance of forward lean.   High Level Balance   High Level Balance Activities Turns;Marching forwards   High Level Balance Comments At counter, practiced forward walking with RUE support, 10 reps x 8 ft, walking to walker, to simulate trial of walking along rail in hallway (per caregiver request).  Standing with feet apart at counter with lessening UE support x 10 seconds, 2 reps, with cues for glut activation.  Partial tandem standing each position, 3 reps x 10 seconds each with 1-2 UE support.  Single limb stance 3 x 10 seconds each leg with bilat. UE support   Explained and had caregiver demo walking at counter   Knee/Hip Exercises: Aerobic   Stationary Bike Foot pedaler x 4 minutes, moderate resistance for lower extremity strengthening  Pt c/o pain in sacrum while sitting  on pillow     Discussed use of footpedaler as part of community fitness/ongoing fitness plan.  Reviewed benefits of various community fitness options.           PT Education - 12/07/15 [phone]    Education provided Yes   Education Details Spent time instructin patient and caregiver in addition to HEP for walking forward along rail in hallway at [place] with caregiver close supervision; initiated HEP, however, at very end of session, pt reports she does not feel safe doing these and does not want to proceed with that as part of HEP; discussed POC and plans to discharge next visit   Person(s) Educated Patient;Caregiver(s)   Methods Explanation;Demonstration;Verbal cues;Handout  Handout initiated,  pt declines   Comprehension Verbalized understanding;Returned demonstration          PT Short Term Goals - 11/08/15 1224    PT SHORT TERM GOAL #1   Title Pt will perform HEP with min A of caregiver to maximize functional gains made in PT. Target date: 11/09/15   Baseline Revised from independent to min A of caregiver due to visual impairments, inconsistent carryover.   Status Achieved   PT SHORT TERM GOAL #2   Title Complete Berg and improve score by 4 points from baseline to indicate improved functional standing balance. Target date: 11/09/15   Baseline Berg not appropriate due pt inability to stand statically without UE support   Status Deferred   PT SHORT TERM GOAL #3   Title Pt will perform sit to/from stand x5 reps with supervision with no AD and minimal UE use to indicate improved functional LE strength. Target date: 11/09/15   Status Achieved   PT SHORT TERM GOAL #4   Title Pt will improve gait velocity from .21 ft/sec to .51 ft/sec to indicate increased efficiency of ambulation. Target date: 11/09/15   Status Achieved   PT SHORT TERM GOAL #5   Title Pt will ambulate 100' consecutively with no rest breaks using LRAD to indicate significant improvement in activity tolerance. Target date: 11/09/15   Baseline Met 11/11.   Status Achieved           PT Long Term Goals - 12/01/15 0943    PT LONG TERM GOAL #1   Title Pt will verbalize understanding of fall prevention strategies to decrease fall risk within home environment. Target date: 12/07/15   Time 8   Period Weeks   Status On-going   PT LONG TERM GOAL #2   Title Pt will increase gait velocity from .21 ft/sec to .81 ft/sec to indicate increased efficiency of ambulation. Target date: 12/07/15   Baseline 1.17 ft/sec   Time 8   Period Weeks   Status Achieved   PT LONG TERM GOAL #3   Title Pt will improve Berg score by 8 points from baseline to indicate significant improvement in functional standing balance. Target date:  12/07/15   Baseline Berg not appropriate due to pt inability to stand statically without UE support   Status Deferred   PT LONG TERM GOAL #4   Title Pt will ambulate 150' over level surfaces with mod I using LRAD to indicate increased independence with household mobility, progress toward PLOF. Target date: 12/07/15   Time 8   Period Weeks   Status New   PT LONG TERM GOAL #5   Title Pt will ambulate 200' over unlevel, paved surfaces with supervision using LRAD to enable pt to safely walk from car to/from doctor's office with caregiver.  Target date: 12/07/15   Time 8   Period Weeks   Status New   PT LONG TERM GOAL #6   Title Pt will perform all above aspects of mobility < / = 2-point increase in within-session pain rating to indicate decreased impact of pain on functional activity tolerance. Target date: 12/07/15   Time 8   Period Weeks   Status New               Plan - 12/07/15 0751    Clinical Impression Statement Pt and caregiver have not yet contacted [facility] for community fitness, and they question again reason for discharge.  Reiterated per therapist discussion last visit plans to discharge next visit.  Reviewed components of HEP today that caregiver has questions about.  Reviewed safety components of standing and walking activities, but ultimately, pt is not comfortable with walking activities along rail at [place].  Plan for discharge next week.   Pt will benefit from skilled therapeutic intervention in order to improve on the following deficits Abnormal gait;Decreased endurance;Decreased coordination;Decreased balance;Decreased strength;Decreased mobility;Impaired tone;Postural dysfunction;Pain;Impaired sensation;Impaired flexibility;Decreased activity tolerance   Rehab Potential Good   Clinical Impairments Affecting Rehab Potential legal blindness   PT Frequency 2x / week   PT Duration 8 weeks   PT Treatment/Interventions Gait training;Functional mobility  training;Therapeutic activities;Therapeutic exercise;Balance training;Patient/family education;Neuromuscular re-education;Manual techniques;Vestibular;DME Instruction   PT Next Visit Plan Plan checking goals and plan for discharge next week.   Consulted and Agree with Plan of Care Patient;Family member/caregiver   Family Member Consulted caregiver, Beverlee Nims        Problem List Patient Active Problem List   Diagnosis Date Noted  . Hypothyroidism 11/26/2015  . 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 Neurogenic bowel 05/24/2015  . 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 Hypotonic neurogenic bladder   . Leg weakness   . Sacral fracture, closed ([place]) 05/21/2015  . Neurosarcoidosis ([place]) 05/21/2015    Jovoni Borkenhagen W. 12/07/2015, 7:55 AM  Frazier Butt., PT  [facility] [address] [place], [place], [address] Phone: [phone]   Fax:  [phone]  Name: Jamie [name] MRN: [id number] Date of Birth: 25-Jul-1956

## 2015-12-08 ENCOUNTER — Ambulatory Visit: Payer: Medicare Other | Admitting: Physical Therapy

## 2015-12-08 DIAGNOSIS — R269 Unspecified abnormalities of gait and mobility: Secondary | ICD-10-CM

## 2015-12-08 DIAGNOSIS — R531 Weakness: Secondary | ICD-10-CM

## 2015-12-08 DIAGNOSIS — R2681 Unsteadiness on feet: Secondary | ICD-10-CM | POA: Diagnosis not present

## 2015-12-08 NOTE — Therapy (Signed)
[facility] [address] [place], [place], [address] Phone: [phone]   Fax:  [phone]  Physical Therapy Treatment  Patient Details  Name: Jamie [name] MRN: [id number] Date of Birth: August 02, 1956 Referring Provider: Alysia Penna, MD  Encounter Date: 12/08/2015      PT End of Session - 12/08/15 2317    Visit Number 16   Number of Visits 17   Date for PT Re-Evaluation 12/11/15   Authorization Type Medicare - GCodes   PT Start Time 6789   PT Stop Time 1016   PT Time Calculation (min) 42 min   Equipment Utilized During Treatment Gait belt   Activity Tolerance Patient tolerated treatment well   Behavior During Therapy [facility] for tasks assessed/performed      Past Medical History  Diagnosis Date  . 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 Progressive gait disorder     Was treated for probable MS until diagnosed with Neurosarcoidosis about 10 years ago.   . Thyroid disease   . Depression     No past surgical history on file.  There were no vitals filed for this visit.  Visit Diagnosis:  Abnormality of gait  Generalized weakness      Subjective Assessment - 12/08/15 0939    Subjective Pt reports she did try walking along the rail at [place] with caregiver yesterday.  Caregiver reports mom and dad somewhat upset by not progressing as fast as they think she should.   Currently in Pain? Yes   Pain Score 2    Pain Location Sacrum   Pain Orientation Mid   Pain Descriptors / Indicators Burning   Pain Type Neuropathic pain   Pain Onset More than a month ago   Aggravating Factors  sitting too long, bending   Pain Relieving Factors standing and walking                         [facility] Adult PT Treatment/Exercise - 12/08/15 0942    Ambulation/Gait   Ambulation/Gait Yes    Ambulation/Gait Assistance 5: Supervision;4: Min guard   Ambulation/Gait Assistance Details Cues provided for outdoor gait for posture, staying close to RW, for technique for negotiating inclines, declines, unlevel areas in sidewalk   Ambulation Distance (Feet) 150 Feet  100 ft, then 200 ft outdoors   Assistive device Rolling walker   Gait Pattern Step-through pattern;Decreased step length - right;Decreased step length - left   Ambulation Surface Level;Indoor;Unlevel;Outdoor;Paved   Standardized Balance Assessment   Standardized Balance Assessment Berg Balance Test   Berg Balance Test   Sit to Stand Able to stand  independently using hands   Standing Unsupported Able to stand 30 seconds unsupported   Sitting with Back Unsupported but Feet Supported on Floor or Stool Able to sit safely and securely 2 minutes   Stand to Sit Controls descent by using hands   Transfers Able to transfer with verbal cueing and /or supervision   Standing Unsupported with Eyes Closed Able to stand 10 seconds with supervision   Standing Ubsupported with Feet Together Needs help to attain position and unable to hold for 15 seconds   From Standing, Reach Forward with Outstretched Arm Loses balance while trying/requires external support   From Standing Position, Pick up Object from Floor Unable to try/needs assist to keep  balance   From Standing Position, Turn to Look Behind Over each Shoulder Needs assist to keep from losing balance and falling   Turn 360 Degrees Needs assistance while turning   Standing Unsupported, Alternately Place Feet on Step/Stool Needs assistance to keep from falling or unable to try   Standing Unsupported, One Foot in [place] help to step but can hold 15 seconds   Standing on One Leg Unable to try or needs assist to prevent fall   Total Score 18          Self Care:  Discussed/provided fall prevention; reviewed importance of transition to community fitness options, potentially [employer].      PT Education - 12/08/15 1013    Education provided Yes   Education Details Fall prevention, plan of care/discharge plans   Person(s) Educated Patient;Caregiver(s)   Methods Explanation;Handout   Comprehension Verbalized understanding          PT Short Term Goals - 11/08/15 1224    PT SHORT TERM GOAL #1   Title Pt will perform HEP with min A of caregiver to maximize functional gains made in PT. Target date: 11/09/15   Baseline Revised from independent to min A of caregiver due to visual impairments, inconsistent carryover.   Status Achieved   PT SHORT TERM GOAL #2   Title Complete Berg and improve score by 4 points from baseline to indicate improved functional standing balance. Target date: 11/09/15   Baseline Berg not appropriate due pt inability to stand statically without UE support   Status Deferred   PT SHORT TERM GOAL #3   Title Pt will perform sit to/from stand x5 reps with supervision with no AD and minimal UE use to indicate improved functional LE strength. Target date: 11/09/15   Status Achieved   PT SHORT TERM GOAL #4   Title Pt will improve gait velocity from .21 ft/sec to .51 ft/sec to indicate increased efficiency of ambulation. Target date: 11/09/15   Status Achieved   PT SHORT TERM GOAL #5   Title Pt will ambulate 100' consecutively with no rest breaks using LRAD to indicate significant improvement in activity tolerance. Target date: 11/09/15   Baseline Met 11/11.   Status Achieved           PT Long Term Goals - 12/08/15 0949    PT LONG TERM GOAL #1   Title Pt will verbalize understanding of fall prevention strategies to decrease fall risk within home environment. Target date: 12/07/15   Time 8   Period Weeks   Status Achieved   PT LONG TERM GOAL #2   Title Pt will increase gait velocity from .21 ft/sec to .81 ft/sec to indicate increased efficiency of ambulation. Target date: 12/07/15   Baseline 1.17 ft/sec   Time 8   Period Weeks    Status Achieved   PT LONG TERM GOAL #3   Title Pt will improve Berg score by 8 points from baseline to indicate significant improvement in functional standing balance. Target date: 12/07/15   Baseline Berg not appropriate due to pt inability to stand statically without UE support   Status Deferred   PT LONG TERM GOAL #4   Title Pt will ambulate 150' over level surfaces with mod I using LRAD to indicate increased independence with household mobility, progress toward PLOF. Target date: 12/07/15   Time 8   Period Weeks   Status Achieved   PT LONG TERM GOAL #5   Title Pt will  ambulate 200' over unlevel, paved surfaces with supervision using LRAD to enable pt to safely walk from car to/from doctor's office with caregiver. Target date: 12/07/15   Time 8   Period Weeks   Status Achieved   PT LONG TERM GOAL #6   Title Pt will perform all above aspects of mobility < / = 2-point increase in within-session pain rating to indicate decreased impact of pain on functional activity tolerance. Target date: 12/07/15   Baseline No reported increase in pain during PT sessions   Time 8   Period Weeks   Status Achieved               Plan - 12-13-15 2317    Clinical Impression Statement Pt has met LTG #1, 2, 4, 5, 6.  LTG #3 not met-Berg unchanged at 18/56 from last check one month ago.  Educated patient and caregiver regarding continued fall risk and needing to use RW for gait safety.  Educated patient in safety with outdoor gait safety.  Pt is appropriate for discharge from PT at this time.   Pt will benefit from skilled therapeutic intervention in order to improve on the following deficits Abnormal gait;Decreased endurance;Decreased coordination;Decreased balance;Decreased strength;Decreased mobility;Impaired tone;Postural dysfunction;Pain;Impaired sensation;Impaired flexibility;Decreased activity tolerance   Rehab Potential Good   Clinical Impairments Affecting Rehab Potential legal blindness    PT Frequency 2x / week   PT Duration 8 weeks   PT Treatment/Interventions Gait training;Functional mobility training;Therapeutic activities;Therapeutic exercise;Balance training;Patient/family education;Neuromuscular re-education;Manual techniques;Vestibular;DME Instruction   PT Next Visit Plan Discharge this visit   Consulted and Agree with Plan of Care Patient;Family member/caregiver   Family Member Consulted caregiver, Lizabeth Leyden - 2015-12-13 [phone]    Functional Assessment Tool Used gait velocity 1.17 ft.sec, Berg Balance 18/56   Functional Limitation Mobility: Walking and moving around   Mobility: Walking and Moving Around Goal Status [phone]) At least 80 percent but less than 100 percent impaired, limited or restricted   Mobility: Walking and Moving Around Discharge Status [phone]) At least 40 percent but less than 60 percent impaired, limited or restricted      Problem List Patient Active Problem List   Diagnosis Date Noted  . Hypothyroidism 11/26/2015  . 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 Neurogenic bowel 05/24/2015  . 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 Hypotonic neurogenic bladder   . Leg weakness   . Sacral fracture, closed ([place]) 05/21/2015  . Neurosarcoidosis ([place]) 05/21/2015    Dominik Yordy W. 12-13-2015, 11:25 PM Frazier Butt., PT [facility] [address] [place], [place], [address] Phone: [phone]   Fax:  [phone]  Name: Jamie [name] MRN: [id number] Date of Birth: Mar 01, 1956    PHYSICAL THERAPY DISCHARGE SUMMARY  Visits from Start of Care: 16  Current functional level related to goals / functional outcomes:     PT Long Term Goals - 12/13/2015 0949    PT LONG TERM GOAL #1   Title Pt will verbalize understanding of fall prevention strategies to decrease fall risk within home environment. Target date: 12/07/15   Time 8   Period Weeks   Status Achieved   PT LONG TERM  GOAL #2   Title Pt will increase gait velocity from .21 ft/sec to .81 ft/sec to indicate increased efficiency of ambulation. Target date: 12/07/15   Baseline 1.17 ft/sec   Time 8   Period Weeks   Status Achieved   PT LONG TERM GOAL #  3   Title Pt will improve Berg score by 8 points from baseline to indicate significant improvement in functional standing balance. Target date: 12/07/15   Baseline Berg not appropriate due to pt inability to stand statically without UE support   Status Deferred   PT LONG TERM GOAL #4   Title Pt will ambulate 150' over level surfaces with mod I using LRAD to indicate increased independence with household mobility, progress toward PLOF. Target date: 12/07/15   Time 8   Period Weeks   Status Achieved   PT LONG TERM GOAL #5   Title Pt will ambulate 200' over unlevel, paved surfaces with supervision using LRAD to enable pt to safely walk from car to/from doctor's office with caregiver. Target date: 12/07/15   Time 8   Period Weeks   Status Achieved   PT LONG TERM GOAL #6   Title Pt will perform all above aspects of mobility < / = 2-point increase in within-session pain rating to indicate decreased impact of pain on functional activity tolerance. Target date: 12/07/15   Baseline No reported increase in pain during PT sessions   Time 8   Period Weeks   Status Achieved    Pt has met 5 of 6 long term goals, including improved gait velocity.   Remaining deficits: Decreased balance, decreased strength, reliance on RW for gait   Education / Equipment: Pt/caregiver educated in HEP, fall prevention, safety with functional mobility tasks at home, progression to community fitness.  Plan: Patient agrees to discharge.  Patient goals were partially met. Patient is being discharged due to                                                     ?????maximizing rehab potential at this time.        Frazier Butt., PT

## 2015-12-08 NOTE — Patient Instructions (Signed)

## 2015-12-20 ENCOUNTER — Ambulatory Visit: Payer: Medicare Other | Admitting: Physical Medicine & Rehabilitation

## 2015-12-28 ENCOUNTER — Ambulatory Visit: Payer: Medicare Other

## 2015-12-30 ENCOUNTER — Ambulatory Visit
Admission: RE | Admit: 2015-12-30 | Discharge: 2015-12-30 | Disposition: A | Discharge status: Home / Self Care | Payer: Medicare Other | Source: Ambulatory Visit | Attending: Internal Medicine | Admitting: Internal Medicine

## 2015-12-30 DIAGNOSIS — Z1231 Encounter for screening mammogram for malignant neoplasm of breast: Secondary | ICD-10-CM

## 2016-01-10 ENCOUNTER — Other Ambulatory Visit (INDEPENDENT_AMBULATORY_CARE_PROVIDER_SITE_OTHER): Payer: Medicare Other

## 2016-01-10 DIAGNOSIS — E039 Hypothyroidism, unspecified: Secondary | ICD-10-CM

## 2016-01-10 LAB — BASIC METABOLIC PANEL
BUN: 17 mg/dL (ref 6–23)
CHLORIDE: 109 meq/L (ref 96–112)
CO2: 26 mEq/L (ref 19–32)
Calcium: 9.3 mg/dL (ref 8.4–10.5)
Creatinine, Ser: 0.68 mg/dL (ref 0.40–1.20)
GFR: 114.15 mL/min (ref >60.00)
Glucose, Bld: 77 mg/dL (ref 70–99)
POTASSIUM: 4.2 meq/L (ref 3.5–5.1)
Sodium: 143 mEq/L (ref 135–145)

## 2016-01-10 LAB — TSH: TSH: 0.25 u[IU]/mL — AB (ref 0.35–4.50)

## 2016-01-10 LAB — T4, FREE: FREE T4: 1.4 ng/dL (ref 0.60–1.60)

## 2016-01-16 DIAGNOSIS — N3020 Other chronic cystitis without hematuria: Secondary | ICD-10-CM | POA: Diagnosis not present

## 2016-01-16 DIAGNOSIS — N3942 Incontinence without sensory awareness: Secondary | ICD-10-CM | POA: Diagnosis not present

## 2016-02-09 ENCOUNTER — Telehealth: Payer: Self-pay | Admitting: Internal Medicine

## 2016-02-09 NOTE — Telephone Encounter (Signed)
Pt called and would like the results of the blood work.  Can you call her when you get a chance?    Jamie [name] [phone]

## 2016-02-09 NOTE — Telephone Encounter (Signed)
Left message informing patient of lab results. 

## 2016-02-10 NOTE — Telephone Encounter (Signed)
Labs mailed to patient.

## 2016-02-10 NOTE — Telephone Encounter (Signed)
Patient is requesting labs to be mailed.  Have you mailed them?

## 2016-02-20 DIAGNOSIS — N3942 Incontinence without sensory awareness: Secondary | ICD-10-CM | POA: Diagnosis not present

## 2016-02-20 DIAGNOSIS — N3020 Other chronic cystitis without hematuria: Secondary | ICD-10-CM | POA: Diagnosis not present

## 2016-02-20 DIAGNOSIS — Z0000 Encounter for general adult medical examination without abnormal findings: Secondary | ICD-10-CM | POA: Diagnosis not present

## 2016-04-20 ENCOUNTER — Ambulatory Visit (INDEPENDENT_AMBULATORY_CARE_PROVIDER_SITE_OTHER): Payer: Medicare Other | Admitting: Family Medicine

## 2016-04-20 ENCOUNTER — Encounter: Payer: Self-pay | Admitting: Family Medicine

## 2016-04-20 ENCOUNTER — Ambulatory Visit (INDEPENDENT_AMBULATORY_CARE_PROVIDER_SITE_OTHER)
Admission: RE | Admit: 2016-04-20 | Discharge: 2016-04-20 | Disposition: A | Discharge status: Home / Self Care | Payer: Medicare Other | Source: Ambulatory Visit | Attending: Family Medicine | Admitting: Family Medicine

## 2016-04-20 VITALS — BP 110/78 | HR 92 | Temp 98.6°F | Resp 12

## 2016-04-20 DIAGNOSIS — R0789 Other chest pain: Secondary | ICD-10-CM | POA: Diagnosis not present

## 2016-04-20 DIAGNOSIS — R05 Cough: Secondary | ICD-10-CM | POA: Diagnosis not present

## 2016-04-20 DIAGNOSIS — J069 Acute upper respiratory infection, unspecified: Secondary | ICD-10-CM | POA: Diagnosis not present

## 2016-04-20 DIAGNOSIS — R059 Cough, unspecified: Secondary | ICD-10-CM

## 2016-04-20 MED ORDER — BENZONATATE 100 MG PO CAPS: ORAL_CAPSULE | ORAL | Status: DC

## 2016-04-20 MED ORDER — FLUTICASONE PROPIONATE 50 MCG/ACT NA SUSP: 1.0000 | Freq: Two times a day (BID) | NASAL | Status: DC

## 2016-04-20 NOTE — Progress Notes (Signed)
Subjective:    Patient ID: Jamie [name], female    DOB: 03-08-1956, 60 y.o.   MRN: [id number]  HPI   [name] is a 60 y.o.female here today with her caregiver complaining of 7-10 days of respiratory symptoms. Her caregiver is concerned about bronchitis/pneumonia. She denies any fever or chills. Symptoms started with sore throat, which have resolved. She describes cough as mild, productive with white/thick sputum. She denies dyspnea, wheezing, or difficulty swallowing.  + Nasal congestion and rhinorrhea.   + Fatigue for 2-3 days, caregiver thinks it has be longer. + Shallow breathing because chest wall tenderness, according to caregiver [name] is very "sensitive" to touch.  She has history of neurosarcoidosis, denies any lung involvement.   No Hx of recent travel. + sick contact, residents. No known insect bite. No Hx of allergies.  She as tried home remedies, honey and lemon. Symptoms otherwise stable.   Review of Systems  Constitutional: Positive for fatigue. Negative for fever, chills and appetite change.  HENT: Positive for congestion and postnasal drip. Negative for ear pain, mouth sores, sinus pressure, sore throat, trouble swallowing and voice change.   Eyes: Negative for discharge, redness and itching.  Respiratory: Positive for cough. Negative for chest tightness, shortness of breath and wheezing.   Cardiovascular: Positive for chest pain (chest wall.). Negative for palpitations and leg swelling.  Gastrointestinal: Negative for nausea, vomiting, abdominal pain and diarrhea.  Musculoskeletal: Negative for back pain and neck pain.  Skin: Negative for rash.  Allergic/Immunologic: Negative for environmental allergies.  Neurological: Positive for headaches (frontal pressure, mild). Negative for dizziness, seizures, weakness and numbness.  Hematological: Negative for adenopathy.     Current Outpatient Prescriptions on File Prior to Visit  Medication Sig  Dispense Refill  . acetaminophen (TYLENOL) 325 MG tablet Take 1-2 tablets (325-650 mg total) by mouth every 4 (four) hours as needed for mild pain.    . Ascorbic Acid (VITAMIN C) 100 MG tablet Take 100 mg by mouth daily.    Marland Kitchen levothyroxine (SYNTHROID, LEVOTHROID) 100 MCG tablet Take 1 tablet (100 mcg total) by mouth daily. 90 tablet 3  . Multiple Vitamin (MULTIVITAMIN WITH MINERALS) TABS tablet Take 1 tablet by mouth daily.    Marland Kitchen thiamine (VITAMIN B-1) 100 MG tablet Take 100 mg by mouth daily.     No current facility-administered medications on file prior to visit.     Past Medical History  Diagnosis Date  . 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 Progressive gait disorder     Was treated for probable MS until diagnosed with Neurosarcoidosis about 10 years ago.   . Thyroid disease   . Depression     Social History   Social History  . Marital Status: Single    Spouse Name: N/A  . Number of Children: N/A  . Years of Education: N/A   Social History Main Topics  . Smoking status: Never Smoker   . Smokeless tobacco: None  . Alcohol Use: No  . Drug Use: No  . Sexual Activity: Not Asked   Other Topics Concern  . None   Social History Narrative    Filed Vitals:   04/20/16 1523  BP: 110/78  Pulse: 92  Temp: 98.6 F (37 C)  Resp: 12   There is no weight on file to calculate BMI.  SpO2  Readings from Last 3 Encounters:  04/20/16 95%  11/24/15 96%  11/22/15 100%       Objective:   Physical Exam  Constitutional: She is oriented to person, place, and time. She appears well-developed and well-nourished.  HENT:  Head: Atraumatic.  Right Ear: External ear normal.  Left Ear: Tympanic membrane, external ear and ear canal normal.  Nose: Rhinorrhea and septal deviation present. Right sinus exhibits no maxillary sinus tenderness and no frontal sinus  tenderness. Left sinus exhibits no maxillary sinus tenderness and no frontal sinus tenderness.  Mouth/Throat: Uvula is midline and mucous membranes are normal. No oropharyngeal exudate, posterior oropharyngeal edema or posterior oropharyngeal erythema.  Hypertrophic turbinates. Right excess cerumen.  Eyes: Conjunctivae are normal.  Cardiovascular: Normal rate, regular rhythm and normal heart sounds.   Pulmonary/Chest: Effort normal and breath sounds normal. No respiratory distress. She has no wheezes. She has no rales. She exhibits tenderness.  Shallow breathing, costochondral tenderness bilateral with palpation.  Lymphadenopathy:    She has no cervical adenopathy.  Neurological: She is alert and oriented to person, place, and time.  she is in a wheel chair today (baseline).  Skin: Skin is warm. No rash noted.  Psychiatric: She has a normal mood and affect.  Poor eye contact, well groomed       Assessment & Plan:   Diagnoses and all orders for this visit:  Cough  Explained that this could be residual symptoms from recent upper respiratory infection.  Interrogation is difficult because patient is a poor historian and some hx was provided by caregiver.  Lung examination today does not suggest pneumonia or bronchitis, we discussed some side effects of antibiotic treatment, I do not think it is needed today, she agrees with holding on this.  Explained cough is a protective mechanism. She does not cough during OV.  I also recommended incentive spirometry like exercises (deep breathing a few times per day), according to caregiver she had one incentive spirometer but cannot find it.  Chest x-ray was ordered today.  Clearly instructed about warning signs.  -     DG Chest 2 View; Future   URI (upper respiratory infection)  Most likely vital with residual cough, explained that cough can last a few days and even weeks after viral respiratory infection. Continue honey at the time.    Over-the-counter antihistaminic as Allegra 180 mg daily might also help.  -     fluticasone (FLONASE) 50 MCG/ACT nasal spray; Place 1 spray into both nostrils 2 (two) times daily. -     benzonatate (TESSALON) 100 MG capsule; 1-2 caps tid as needed.   Chest wall pain  Seems to be musculoskeletal and can be aggravated by the cough. Will monitor for now. CXR ordered today.    -Patient advised to return or notify a doctor immediately if symptoms worsen or persist or new concerns arise.    Jayle Solarz G. [place], MD  [facility]. [place] office.

## 2016-04-20 NOTE — Patient Instructions (Addendum)
A few things to remember from today's visit:   1. Cough Today auscultation otherwise normal, history is not suggestive of pneumonia but still chest x-ray was ordered. Cough can last a few weeks after an upper respiratory infection.   - DG Chest 2 View; Future  2. URI (upper respiratory infection) Most likely viral with residual symptoms.   - fluticasone (FLONASE) 50 MCG/ACT nasal spray; Place 1 spray into both nostrils 2 (two) times daily.  Dispense: 16 g; Refill: 2 - benzonatate (TESSALON) 100 MG capsule; 1-2 caps tid as needed.  Dispense: 30 capsule; Refill: 0  Honey and over-the-counter Allegra 180 mg might help with symptoms as well.   If fever shortness of breath, lethargy, or worsening symptoms seek immediate medical attention.

## 2016-05-09 ENCOUNTER — Ambulatory Visit (INDEPENDENT_AMBULATORY_CARE_PROVIDER_SITE_OTHER): Payer: Medicare Other | Admitting: Family

## 2016-05-09 ENCOUNTER — Encounter: Payer: Self-pay | Admitting: Family

## 2016-05-09 VITALS — BP 110/62 | HR 80 | Temp 98.1°F | Resp 14 | Ht 64.0 in | Wt 129.0 lb

## 2016-05-09 DIAGNOSIS — R600 Localized edema: Secondary | ICD-10-CM | POA: Insufficient documentation

## 2016-05-09 DIAGNOSIS — D229 Melanocytic nevi, unspecified: Secondary | ICD-10-CM

## 2016-05-09 DIAGNOSIS — R21 Rash and other nonspecific skin eruption: Secondary | ICD-10-CM | POA: Insufficient documentation

## 2016-05-09 DIAGNOSIS — L819 Disorder of pigmentation, unspecified: Secondary | ICD-10-CM

## 2016-05-09 MED ORDER — FUROSEMIDE 20 MG PO TABS: 20.0000 mg | ORAL_TABLET | Freq: Every day | ORAL | Status: DC

## 2016-05-09 MED ORDER — TRIAMCINOLONE ACETONIDE 0.1 % EX CREA: 1.0000 "application " | TOPICAL_CREAM | Freq: Two times a day (BID) | CUTANEOUS | Status: DC

## 2016-05-09 NOTE — Progress Notes (Signed)
Pre visit review using our clinic review tool, if applicable. No additional management support is needed unless otherwise documented below in the visit note. 

## 2016-05-09 NOTE — Assessment & Plan Note (Signed)
Elevated skin lesion with darkening and increased size. Most likely a benign lesion however concern remains for possible malignant lesion and may need to be biopsied. Refer to dermatology.

## 2016-05-09 NOTE — Patient Instructions (Signed)
Thank you for choosing [facility].  Summary/Instructions:  Your prescription(s) have been submitted to your pharmacy or been printed and provided for you. Please take as directed and contact our office if you believe you are having problem(s) with the medication(s) or have any questions.  They will call to schedule the dermatology appointment.  If your symptoms worsen or fail to improve, please contact our office for further instruction, or in case of emergency go directly to the emergency room at the closest medical facility.

## 2016-05-09 NOTE — Progress Notes (Signed)
Subjective:    Patient ID: Jamie [name], female    DOB: 1956/02/03, 60 y.o.   MRN: [id number]  Chief Complaint  Patient presents with  . Mole    mole on right thigh, edema on and off in lower legs, places on feet from swelling that looks like rashes    HPI:  Jamie [name] is a 60 y.o. 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 and presents today for an office visit.   1.) Mole on right thigh - This is a new problem. Associated symptom of a mole lcoated on her right thigh has been going on for about 1 month and has recently elevated and changed in size and texture. Denies any modifying factors or attempted treatments.  2.) Lower extremity edema - This is a new problem. Associated symptom of edema located in her bilateral lower extremities has been waxing and waning with concern that it appears like a rash. Previously maintained on furosemide.    Allergies  Allergen Reactions  . Remicade [Infliximab]     shingles  . Penicillins Rash     Current Outpatient Prescriptions on File Prior to Visit  Medication Sig Dispense Refill  . acetaminophen (TYLENOL) 325 MG tablet Take 1-2 tablets (325-650 mg total) by mouth every 4 (four) hours as needed for mild pain.    . Ascorbic Acid (VITAMIN C) 100 MG tablet Take 100 mg by mouth daily.    . benzonatate (TESSALON) 100 MG capsule 1-2 caps tid as needed. 30 capsule 0  . fluticasone (FLONASE) 50 MCG/ACT nasal spray Place 1 spray into both nostrils 2 (two) times daily. 16 g 2  . levothyroxine (SYNTHROID, LEVOTHROID) 100 MCG tablet Take 1 tablet (100 mcg total) by mouth daily. 90 tablet 3  . Multiple Vitamin (MULTIVITAMIN WITH MINERALS) TABS tablet Take 1 tablet by mouth daily.    Marland Kitchen thiamine (VITAMIN B-1) 100 MG tablet Take 100 mg by mouth daily.     No current facility-administered medications  on file prior to visit.     Review of Systems  Constitutional: Negative for fever and chills.  Respiratory: Negative for chest tightness and shortness of breath.   Cardiovascular: Positive for leg swelling. Negative for chest pain and palpitations.  Skin: Positive for color change and rash.      Objective:    BP 110/62 mmHg  Pulse 80  Temp(Src) 98.1 F (36.7 C) (Oral)  Resp 14  Ht 5\' 4"  (1.626 m)  Wt 129 lb (58.514 kg)  BMI 22.13 kg/m2  SpO2 97% Nursing note and vital signs reviewed.  Physical Exam  Constitutional: She is oriented to person, place, and time. She appears well-developed and well-nourished. No distress.  Cardiovascular: Normal rate, regular rhythm, normal heart sounds and intact distal pulses.   Mild lower extremity edema nonpitting noted bilaterally. Pulses are intact and appropriate. Scaly, dry lesion noted on dorsal aspect of bilateral feet and left lateral malleolus.  Pulmonary/Chest: Effort normal and breath sounds normal.  Neurological: She is alert and oriented to person, place, and time.  Skin: Skin is warm and dry.  Approximately 1 cm x 0.75 cm annular brown/black lesion, raised with no tenderness and well-defined borders.  Psychiatric: She has a normal mood and affect. Her behavior is normal. Judgment and thought content normal.       Assessment & Plan:   Problem List Items Addressed  This Visit      Musculoskeletal and Integument   Color change in pigmented skin lesion    Elevated skin lesion with darkening and increased size. Most likely a benign lesion however concern remains for possible malignant lesion and may need to be biopsied. Refer to dermatology.      Relevant Orders   Ambulatory referral to Dermatology     Other   Lower extremity edema - Primary    Lower extremity edema appears mild with scaly areas of dry skin consistent with eczema-like lesion. Start triamcinolone. Encouraged to moisturize feet. Follow-up if symptoms worsen or  fail to improve. We will also start furosemide as needed for lower extremity edema.      Relevant Medications   triamcinolone cream (KENALOG) 0.1 %   furosemide (LASIX) 20 MG tablet       I am having Ms. Flinchbaugh start on triamcinolone cream and furosemide. I am also having her maintain her acetaminophen, multivitamin with minerals, thiamine, vitamin C, levothyroxine, fluticasone, and benzonatate.   Meds ordered this encounter  Medications  . triamcinolone cream (KENALOG) 0.1 %    Sig: Apply 1 application topically 2 (two) times daily.    Dispense:  30 g    Refill:  0    Order Specific Question:  Supervising Provider    Answer:  Pricilla Holm A [id number]  . furosemide (LASIX) 20 MG tablet    Sig: Take 1 tablet (20 mg total) by mouth daily.    Dispense:  30 tablet    Refill:  3    Order Specific Question:  Supervising Provider    Answer:  Pricilla Holm A [id number]     Follow-up: Return if symptoms worsen or fail to improve.  Mauricio Po, FNP

## 2016-05-09 NOTE — Assessment & Plan Note (Signed)
Lower extremity edema appears mild with scaly areas of dry skin consistent with eczema-like lesion. Start triamcinolone. Encouraged to moisturize feet. Follow-up if symptoms worsen or fail to improve. We will also start furosemide as needed for lower extremity edema.

## 2016-05-25 ENCOUNTER — Encounter: Payer: Self-pay | Admitting: Internal Medicine

## 2016-05-25 ENCOUNTER — Ambulatory Visit (INDEPENDENT_AMBULATORY_CARE_PROVIDER_SITE_OTHER): Payer: Medicare Other | Admitting: Internal Medicine

## 2016-05-25 ENCOUNTER — Other Ambulatory Visit (INDEPENDENT_AMBULATORY_CARE_PROVIDER_SITE_OTHER): Payer: Medicare Other

## 2016-05-25 VITALS — BP 106/64 | HR 81 | Temp 97.8°F | Resp 14 | Ht 64.0 in | Wt 125.0 lb

## 2016-05-25 DIAGNOSIS — R350 Frequency of micturition: Secondary | ICD-10-CM

## 2016-05-25 DIAGNOSIS — E039 Hypothyroidism, unspecified: Secondary | ICD-10-CM

## 2016-05-25 LAB — T4, FREE: FREE T4: 1.14 ng/dL (ref 0.60–1.60)

## 2016-05-25 LAB — CBC
HCT: 35.9 % — ABNORMAL LOW (ref 36.0–46.0)
Hemoglobin: 11.8 g/dL — ABNORMAL LOW (ref 12.0–15.0)
MCHC: 32.7 g/dL (ref 30.0–36.0)
MCV: 83.1 fl (ref 78.0–100.0)
PLATELETS: 263 10*3/uL (ref 150.0–400.0)
RBC: 4.32 Mil/uL (ref 3.87–5.11)
RDW: 15.9 % — ABNORMAL HIGH (ref 11.5–15.5)
WBC: 8.3 10*3/uL (ref 4.0–10.5)

## 2016-05-25 LAB — TSH: TSH: 3.7 u[IU]/mL (ref 0.35–4.50)

## 2016-05-25 NOTE — Patient Instructions (Signed)
We are checking the blood work today. If we get any signs of bladder infection from the blood work we will treat. If we do not then we will need you to bring in a sample to the office to test for infection.

## 2016-05-25 NOTE — Progress Notes (Signed)
   Subjective:    Patient ID: Jamie [name], female    DOB: 11/06/1956, 60 y.o.   MRN: [id number]  HPI The patient is a 60 YO female coming in for follow up of her thyroid and is concerned that she has a urinary tract infection. She is incontinent at home due to her neurogenic bladder. Would need catheter to get sample. NO change to color or odor. No pain (has no feeling). Is having more production. Denies fevers or chills. Not feeling quite herself.   Review of Systems  Constitutional: Negative for fever, chills, appetite change and unexpected weight change.  HENT: Negative.   Eyes: Negative.   Respiratory: Negative for cough, chest tightness, shortness of breath and wheezing.   Cardiovascular: Negative for chest pain, palpitations and leg swelling.  Gastrointestinal: Negative for abdominal pain, diarrhea, constipation, blood in stool and abdominal distention.       Lack of feeling with incontinence  Musculoskeletal: Positive for myalgias, arthralgias and gait problem.  Neurological: Positive for weakness and numbness. Negative for dizziness, syncope and headaches.      Objective:   Physical Exam  Constitutional: She is oriented to person, place, and time. She appears well-developed. No distress.  HENT:  Head: Normocephalic and atraumatic.  Eyes: EOM are normal.  Neck: No thyromegaly present.  Cardiovascular: Normal rate and regular rhythm.   Pulmonary/Chest: Effort normal and breath sounds normal. No respiratory distress. She has no wheezes. She has no rales.  Abdominal: Soft. Bowel sounds are normal. She exhibits no distension and no mass. There is no tenderness. There is no rebound and no guarding.  Musculoskeletal: She exhibits no edema.  Neurological: She is alert and oriented to person, place, and time. Coordination abnormal.  In wheelchair today  Skin: Skin is warm and dry.  Psychiatric: She has a normal mood and affect.   Filed Vitals:   05/25/16 0931  BP: 106/64  Pulse: 81   Temp: 97.8 F (36.6 C)  TempSrc: Oral  Resp: 14  Height: 5\' 4"  (1.626 m)  Weight: 125 lb (56.7 kg)  SpO2: 98%      Assessment & Plan:

## 2016-05-25 NOTE — Progress Notes (Signed)
Pre visit review using our clinic review tool, if applicable. No additional management support is needed unless otherwise documented below in the visit note. 

## 2016-05-26 DIAGNOSIS — R350 Frequency of micturition: Secondary | ICD-10-CM | POA: Insufficient documentation

## 2016-05-26 NOTE — Assessment & Plan Note (Signed)
Has home health who can do catheter for sample at home. U/A ordered. Also checking CBC and if increased WBC will treat without sample.

## 2016-05-26 NOTE — Assessment & Plan Note (Addendum)
Checking TSH and free T4, taking synthroid 100 mcg daily and adjust as needed.

## 2016-05-29 ENCOUNTER — Telehealth: Payer: Self-pay | Admitting: Internal Medicine

## 2016-05-29 NOTE — Telephone Encounter (Signed)
Patient is needing order to be catheterized to [facility] Ph number [phone].  Fax:[phone].  Is not currently in the care of [facility].  Told caregiver that patient might have to come into the office for OV.  Caregiver states she already has a script from Dr. Sharlet Salina for catheterization.  Caregiver states it has been a long time since she has done this and would prefer [facility] to do it.

## 2016-05-29 NOTE — Telephone Encounter (Signed)
Angie from [facility] is going to go out and collect the sample. She called for verbal orders.

## 2016-05-29 NOTE — Telephone Encounter (Signed)
We just gave rx for supplies. She can do the catheterization at home as they were for self cath. She stated they had done in past. I'm not sure a home health agency would come out to do just this. They can go to ER or urgent care for teaching.

## 2016-05-29 NOTE — Telephone Encounter (Signed)
Fine

## 2016-06-04 ENCOUNTER — Telehealth: Payer: Self-pay

## 2016-06-04 NOTE — Telephone Encounter (Signed)
Gave orders and sent office notes.

## 2016-06-04 NOTE — Telephone Encounter (Signed)
Caregiver states that there was suppose to be a order for in and out cath. They are still wanting on this order. Please follow up [facility].

## 2016-06-05 DIAGNOSIS — L309 Dermatitis, unspecified: Secondary | ICD-10-CM | POA: Diagnosis not present

## 2016-06-05 DIAGNOSIS — D2371 Other benign neoplasm of skin of right lower limb, including hip: Secondary | ICD-10-CM | POA: Diagnosis not present

## 2016-06-06 DIAGNOSIS — R350 Frequency of micturition: Secondary | ICD-10-CM | POA: Diagnosis not present

## 2016-06-06 DIAGNOSIS — N390 Urinary tract infection, site not specified: Secondary | ICD-10-CM | POA: Diagnosis not present

## 2016-06-06 DIAGNOSIS — D8689 Sarcoidosis of other sites: Secondary | ICD-10-CM | POA: Diagnosis not present

## 2016-06-13 ENCOUNTER — Other Ambulatory Visit: Payer: Self-pay | Admitting: Internal Medicine

## 2016-06-13 ENCOUNTER — Telehealth: Payer: Self-pay | Admitting: Geriatric Medicine

## 2016-06-13 DIAGNOSIS — R350 Frequency of micturition: Secondary | ICD-10-CM | POA: Diagnosis not present

## 2016-06-13 DIAGNOSIS — D8689 Sarcoidosis of other sites: Secondary | ICD-10-CM | POA: Diagnosis not present

## 2016-06-13 MED ORDER — NITROFURANTOIN MONOHYD MACRO 100 MG PO CAPS: 100.0000 mg | ORAL_CAPSULE | Freq: Two times a day (BID) | ORAL | Status: DC

## 2016-06-13 NOTE — Telephone Encounter (Signed)
Left message on machine informing patient that she does have a UTI and that we have sent in [place] to her pharmacy.

## 2016-06-21 ENCOUNTER — Telehealth: Payer: Self-pay

## 2016-06-21 ENCOUNTER — Ambulatory Visit: Payer: Medicare Other | Admitting: Physical Medicine & Rehabilitation

## 2016-06-21 NOTE — Telephone Encounter (Signed)
Got a call that the patient finished the abx. They wanted to know if she needed to be rechecked for the uti or what. Please advise or follow up, Thank you.

## 2016-06-21 NOTE — Telephone Encounter (Signed)
Left message informing patient that if she is still having symptoms to call us back, but if she is feeling ok then she does not need to be checked again.

## 2016-06-25 ENCOUNTER — Ambulatory Visit (HOSPITAL_BASED_OUTPATIENT_CLINIC_OR_DEPARTMENT_OTHER): Payer: Medicare Other | Admitting: Physical Medicine & Rehabilitation

## 2016-06-25 ENCOUNTER — Encounter: Payer: Self-pay | Admitting: Physical Medicine & Rehabilitation

## 2016-06-25 ENCOUNTER — Encounter: Payer: Medicare Other | Attending: Physical Medicine & Rehabilitation

## 2016-06-25 VITALS — BP 98/64 | HR 81 | Resp 14

## 2016-06-25 DIAGNOSIS — R269 Unspecified abnormalities of gait and mobility: Secondary | ICD-10-CM | POA: Diagnosis not present

## 2016-06-25 DIAGNOSIS — G629 Polyneuropathy, unspecified: Secondary | ICD-10-CM | POA: Insufficient documentation

## 2016-06-25 DIAGNOSIS — S3422XS Injury of nerve root of sacral spine, sequela: Secondary | ICD-10-CM | POA: Diagnosis not present

## 2016-06-25 DIAGNOSIS — N312 Flaccid neuropathic bladder, not elsewhere classified: Secondary | ICD-10-CM

## 2016-06-25 DIAGNOSIS — S32129G Unspecified Zone II fracture of sacrum, subsequent encounter for fracture with delayed healing: Secondary | ICD-10-CM | POA: Insufficient documentation

## 2016-06-25 DIAGNOSIS — S32119G Unspecified Zone I fracture of sacrum, subsequent encounter for fracture with delayed healing: Secondary | ICD-10-CM | POA: Diagnosis not present

## 2016-06-25 DIAGNOSIS — M792 Neuralgia and neuritis, unspecified: Secondary | ICD-10-CM | POA: Insufficient documentation

## 2016-06-25 DIAGNOSIS — G8929 Other chronic pain: Secondary | ICD-10-CM | POA: Diagnosis not present

## 2016-06-25 MED ORDER — GABAPENTIN 100 MG PO CAPS: 100.0000 mg | ORAL_CAPSULE | Freq: Three times a day (TID) | ORAL | Status: DC

## 2016-06-25 NOTE — Progress Notes (Signed)
Subjective:    Patient ID: Jamie [name], female    DOB: October 08, 1956, 60 y.o.   MRN: [id number] 05/20/15 fall, MRI Lumbar spine showed sacral fracture S1-S2 with mild angulation and some edema and hemorrhage in the area HPI Pt no longer following with urology, per father, has had recurrent UTIs  Pt has burning pain in both feet as well as Bilateral posterior thighs and buttocks area. Pain is continuous. Not activity related. Occurs at nighttime as well.  Continues have problem with bowel and bladder incontinence.  Pain Inventory Average Pain 2 Pain Right Now 2 My pain is intermittent, burning and stabbing  In the last 24 hours, has pain interfered with the following? General activity 2 Relation with others 0 Enjoyment of life 0 What TIME of day is your pain at its worst? no selection Sleep (in general) NA  Pain is worse with: bending and sitting Pain improves with: no selection Relief from Meds: 0  Mobility walk with assistance use a walker ability to climb steps?  no do you drive?  no use a wheelchair transfers alone  Function not employed: date last employed . I need assistance with the following:  meal prep, household duties and shopping  Neuro/Psych bladder control problems bowel control problems numbness trouble walking  Prior Studies Any changes since last visit?  no  Physicians involved in your care Any changes since last visit?  no   Family History  Problem Relation Age of Onset  . Hypertension Father    Social History   Social History  . Marital Status: Single    Spouse Name: N/A  . Number of Children: N/A  . Years of Education: N/A   Social History Main Topics  . Smoking status: Never Smoker   . Smokeless tobacco: None  . Alcohol Use: No  . Drug Use: No  . Sexual Activity: Not Asked   Other Topics Concern  . None   Social History Narrative   History reviewed. No pertinent past surgical history. Past Medical History  Diagnosis Date    . 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 Progressive gait disorder     Was treated for probable MS until diagnosed with Neurosarcoidosis about 10 years ago.   . Thyroid disease   . Depression    BP 98/64 mmHg  Pulse 81  Resp 14  SpO2 98%  Opioid Risk Score:   Fall Risk Score:  `1  Depression screen PHQ 2/9  Depression screen [facility] 2/9 07/12/2015 07/12/2015  Decreased Interest 0 0  Down, Depressed, Hopeless 0 0  PHQ - 2 Score 0 0  Altered sleeping 0 -  Tired, decreased energy 1 -  Change in appetite 0 -  Feeling bad or failure about yourself  0 -  Trouble concentrating 0 -  Moving slowly or fidgety/restless 3 -  Suicidal thoughts 0 -  PHQ-9 Score 4 -     Review of Systems  Constitutional: Negative.   HENT: Negative.   Eyes: Negative.   Respiratory: Positive for cough.   Cardiovascular: Positive for leg swelling.  Gastrointestinal: Positive for abdominal pain and constipation.  Endocrine: Negative.   Genitourinary: Positive for difficulty urinating.  Musculoskeletal: Positive for gait problem.  Skin: Positive for rash.  Allergic/Immunologic: Negative.   Neurological: Positive for numbness.  Hematological: Negative.   Psychiatric/Behavioral: Negative.   All  other systems reviewed and are negative.      Objective:   Physical Exam  Constitutional: She is oriented to person, place, and time. She appears well-developed and well-nourished.  HENT:  Head: Normocephalic and atraumatic.  Eyes:  Severe visual impairment  Neck: Normal range of motion.  Neurological: She is alert and oriented to person, place, and time. No sensory deficit. Gait abnormal.  Sensation intact to light touch in both lower limbs. Motor strength is 4+ bilateral hip flexor and knee extensor ankle dorsiflexor 2 minus bilateral ankle plantar flexor. There is mild calf atrophy  bilaterally.  Ambulates without evidence of toe drag bilateral lower extremities using a rolling walker. She needs assistance to avoid obstacles in the office.  Psychiatric: She has a normal mood and affect.  Nursing note and vitals reviewed.         Assessment & Plan:  1. 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 As discussed with the patient and her caregivers, she is unlikely to get recovery at this point.. She is over 1 year post injury. We discussed the importance of keeping up with her home exercise program. She has made improvements with her mobility because she has been able to strengthen her quads and improved balance. She continues to have fear of falling despite aggressive rehabilitation efforts. Continue home exercise program with emphasis towards ambulation and building confidence with her balance over time. She has a caregiver who ambulates with her 20-30 minutes a day.  2. Neurogenic bowel and bladder, recommend follow-up with urology in regards to recurrent UTIs. She is likely retaining urine.She continues with her Kegel exercises although there may be some limited in terms of how much strength she can get based on her sacral nerve injuries.  3. Neuropathic pain, this is related to her sacral nerve root injury, will trial gabapentin 100 3 times a day monitor for sedation. Total trial capsaicin cream  We discussed prognosis as well as her current deficits and treatments with the patient, her caregiver as well as her father. Over half of the 25 min visit was spent counseling and coordinating care.  Returning clinic in 6 months

## 2016-06-25 NOTE — Patient Instructions (Signed)
0.025% capsaicin may help with burning pain No improvement with TENs  Will give you information on PENS, this is an electrical stimulation device that uses micro-needles in a patch form. This would require coming to the clinic twice a week for a month.

## 2016-06-27 ENCOUNTER — Encounter: Payer: Self-pay | Admitting: Internal Medicine

## 2016-06-29 ENCOUNTER — Telehealth: Payer: Self-pay | Admitting: Internal Medicine

## 2016-06-29 DIAGNOSIS — R350 Frequency of micturition: Secondary | ICD-10-CM | POA: Diagnosis not present

## 2016-06-29 DIAGNOSIS — D8689 Sarcoidosis of other sites: Secondary | ICD-10-CM | POA: Diagnosis not present

## 2016-06-29 NOTE — Telephone Encounter (Signed)
[name], nurse from [facility] home health request order to visit Jamie [name] next week. She also wondering if Dr. Sharlet Salina wants them to get a urine specimen to follow up after antibiotic (she complete this med 2 days ago, no pain or burn when urinate but odor present). Please advise.

## 2016-06-29 NOTE — Telephone Encounter (Signed)
Does not need repeat sample if no symptoms.

## 2016-07-02 NOTE — Telephone Encounter (Signed)
Called and spoke to [place] and relayed the message that a repeat sample is not needed if no symptoms are present.

## 2016-07-05 ENCOUNTER — Telehealth: Payer: Self-pay

## 2016-07-05 DIAGNOSIS — D8689 Sarcoidosis of other sites: Secondary | ICD-10-CM | POA: Diagnosis not present

## 2016-07-05 DIAGNOSIS — F40298 Other specified phobia: Secondary | ICD-10-CM | POA: Diagnosis not present

## 2016-07-05 NOTE — Telephone Encounter (Signed)
Home Health Cert/Plan of Care received (06/06/2016 - 08/04/2016) and placed on MD's desk for signature

## 2016-07-05 NOTE — Telephone Encounter (Signed)
Paperwork signed, faxed, copy sent to scan 

## 2016-07-10 DIAGNOSIS — D8689 Sarcoidosis of other sites: Secondary | ICD-10-CM | POA: Diagnosis not present

## 2016-07-10 DIAGNOSIS — R350 Frequency of micturition: Secondary | ICD-10-CM | POA: Diagnosis not present

## 2016-07-11 ENCOUNTER — Telehealth: Payer: Self-pay | Admitting: Internal Medicine

## 2016-07-11 NOTE — Telephone Encounter (Signed)
Juliann Pulse is calling to advise that the patient is complaining of burning during urination again. Antibiotics have already been completed. Please call with orders to take specimen.

## 2016-07-12 NOTE — Telephone Encounter (Signed)
Called and spoke with Juliann Pulse and gave verbal order for UA and culture. If this keeps happening, she has been instructed to call urology.

## 2016-07-12 NOTE — Telephone Encounter (Signed)
Has she called her urologist? Okay with verbal order for U/A and urine culture.

## 2016-07-13 DIAGNOSIS — D8689 Sarcoidosis of other sites: Secondary | ICD-10-CM | POA: Diagnosis not present

## 2016-07-13 DIAGNOSIS — N390 Urinary tract infection, site not specified: Secondary | ICD-10-CM | POA: Diagnosis not present

## 2016-07-13 DIAGNOSIS — R350 Frequency of micturition: Secondary | ICD-10-CM | POA: Diagnosis not present

## 2016-07-17 MED ORDER — SULFAMETHOXAZOLE-TRIMETHOPRIM 800-160 MG PO TABS: 1.0000 | ORAL_TABLET | Freq: Two times a day (BID) | ORAL | 0 refills | Status: DC

## 2016-07-17 NOTE — Telephone Encounter (Signed)
Have sent in bactrim for the new infection. Take 1 pill twice daily for 1 week. She needs to call and schedule visit with her urologist as well to find out why she is having so many uti recently.

## 2016-07-17 NOTE — Telephone Encounter (Signed)
Pts caregiver called and wanted to know the results of the UA and wanted to know if she needs to take any medicine for it. Please follow up and call back thanks.

## 2016-07-17 NOTE — Telephone Encounter (Signed)
Did you get the results in your stack of papers?

## 2016-07-18 NOTE — Telephone Encounter (Signed)
Noted  

## 2016-07-18 NOTE — Telephone Encounter (Signed)
Patient called back about lab results. I informed her of the results and to pick up the rx. Also to please call and make and app with her urologists.

## 2016-07-26 DIAGNOSIS — D8689 Sarcoidosis of other sites: Secondary | ICD-10-CM | POA: Diagnosis not present

## 2016-07-26 DIAGNOSIS — R350 Frequency of micturition: Secondary | ICD-10-CM | POA: Diagnosis not present

## 2016-07-31 DIAGNOSIS — R350 Frequency of micturition: Secondary | ICD-10-CM | POA: Diagnosis not present

## 2016-07-31 DIAGNOSIS — D8689 Sarcoidosis of other sites: Secondary | ICD-10-CM | POA: Diagnosis not present

## 2016-07-31 DIAGNOSIS — K592 Neurogenic bowel, not elsewhere classified: Secondary | ICD-10-CM | POA: Diagnosis not present

## 2016-07-31 DIAGNOSIS — N319 Neuromuscular dysfunction of bladder, unspecified: Secondary | ICD-10-CM | POA: Diagnosis not present

## 2016-08-06 ENCOUNTER — Encounter: Payer: Self-pay | Admitting: Internal Medicine

## 2016-08-07 DIAGNOSIS — F40298 Other specified phobia: Secondary | ICD-10-CM | POA: Diagnosis not present

## 2016-08-07 DIAGNOSIS — D8689 Sarcoidosis of other sites: Secondary | ICD-10-CM | POA: Diagnosis not present

## 2016-08-10 DIAGNOSIS — G8929 Other chronic pain: Secondary | ICD-10-CM | POA: Diagnosis not present

## 2016-08-10 DIAGNOSIS — F40298 Other specified phobia: Secondary | ICD-10-CM | POA: Diagnosis not present

## 2016-08-10 DIAGNOSIS — D8689 Sarcoidosis of other sites: Secondary | ICD-10-CM | POA: Diagnosis not present

## 2016-08-13 DIAGNOSIS — H47213 Primary optic atrophy, bilateral: Secondary | ICD-10-CM | POA: Diagnosis not present

## 2016-09-10 DIAGNOSIS — M6281 Muscle weakness (generalized): Secondary | ICD-10-CM | POA: Diagnosis not present

## 2016-09-10 DIAGNOSIS — R270 Ataxia, unspecified: Secondary | ICD-10-CM | POA: Diagnosis not present

## 2016-09-10 DIAGNOSIS — D8689 Sarcoidosis of other sites: Secondary | ICD-10-CM | POA: Diagnosis not present

## 2016-09-13 DIAGNOSIS — L259 Unspecified contact dermatitis, unspecified cause: Secondary | ICD-10-CM | POA: Diagnosis not present

## 2016-09-18 ENCOUNTER — Other Ambulatory Visit: Payer: Self-pay | Admitting: Physical Medicine & Rehabilitation

## 2016-09-20 ENCOUNTER — Other Ambulatory Visit: Payer: Self-pay | Admitting: Physical Medicine & Rehabilitation

## 2016-10-03 ENCOUNTER — Other Ambulatory Visit: Payer: Self-pay | Admitting: Internal Medicine

## 2016-10-09 DIAGNOSIS — Z23 Encounter for immunization: Secondary | ICD-10-CM | POA: Diagnosis not present

## 2016-10-12 ENCOUNTER — Ambulatory Visit (INDEPENDENT_AMBULATORY_CARE_PROVIDER_SITE_OTHER): Payer: Medicare Other | Admitting: Internal Medicine

## 2016-10-12 ENCOUNTER — Encounter: Payer: Self-pay | Admitting: Internal Medicine

## 2016-10-12 DIAGNOSIS — N312 Flaccid neuropathic bladder, not elsewhere classified: Secondary | ICD-10-CM

## 2016-10-12 DIAGNOSIS — R1310 Dysphagia, unspecified: Secondary | ICD-10-CM | POA: Diagnosis not present

## 2016-10-12 MED ORDER — PANTOPRAZOLE SODIUM 40 MG PO TBEC: 40.0000 mg | DELAYED_RELEASE_TABLET | Freq: Every day | ORAL | 3 refills | Status: DC

## 2016-10-12 NOTE — Progress Notes (Signed)
   Subjective:    Patient ID: Jamie [name], female    DOB: [birth year], 60 y.o.   MRN: [id number]  HPI The patient is a 60 YO female coming in for concerns about UTI. She has neurogenic bladder and constantly is retaining with overflow incontinence at times. She is able to go sometimes. More odor and change in color. She has no sensation in the perineal area so does not have sensation when she goes. No fevers or chills.  Next concern is about some swallowing and choking problems. With pills and large foods. They are cutting her food up smaller and she is doing okay. Never happens with water or liquids. She does have GERD which has been worse lately. She gets acid reflux to her mouth and especially after eating. Not taking anything for GERD. No new weakness in her neck or face or change in speech. No other stroke symptoms. No problems with mouth getting tired while chewing or saliva choking her.   Review of Systems  Constitutional: Negative for activity change, appetite change, fatigue, fever and unexpected weight change.  HENT: Positive for trouble swallowing.   Respiratory: Negative.   Cardiovascular: Negative.   Gastrointestinal:       GERD worsening  Genitourinary: Positive for difficulty urinating and frequency. Negative for dysuria.       Odor of urine  Musculoskeletal: Negative.   Neurological: Positive for weakness and numbness. Negative for syncope and speech difficulty.      Objective:   Physical Exam  Constitutional: She appears well-developed and well-nourished.  HENT:  Head: Normocephalic and atraumatic.  Eyes: EOM are normal.  Neck: Normal range of motion.  Cardiovascular: Normal rate and regular rhythm.   Pulmonary/Chest: Effort normal and breath sounds normal.  Abdominal: Soft. She exhibits no distension. There is no tenderness. There is no rebound.  Musculoskeletal: She exhibits no edema.  Neurological:  Wheelchair, can use walker at home for short distances  Skin: Skin  is warm and dry.   Vitals:   10/12/16 1008  BP: 100/80  Pulse: 89  Resp: 12  Temp: 98 F (36.7 C)  TempSrc: Oral  SpO2: 98%  Weight: 122 lb (55.3 kg)      Assessment & Plan:

## 2016-10-12 NOTE — Progress Notes (Signed)
Pre visit review using our clinic review tool, if applicable. No additional management support is needed unless otherwise documented below in the visit note. 

## 2016-10-12 NOTE — Patient Instructions (Signed)
We have sent in protonix which is an acid reflux medicine to see if this helps with the swallowing. Take 1 pill daily for 1 month and then call and let us know if the swallowing is changed.   We have done the order for the cath and the urine tests to check for infection. We will call you back with the results.

## 2016-10-13 DIAGNOSIS — N390 Urinary tract infection, site not specified: Secondary | ICD-10-CM | POA: Diagnosis not present

## 2016-10-13 DIAGNOSIS — B9620 Unspecified Escherichia coli [E. coli] as the cause of diseases classified elsewhere: Secondary | ICD-10-CM | POA: Diagnosis not present

## 2016-10-14 DIAGNOSIS — R1310 Dysphagia, unspecified: Secondary | ICD-10-CM | POA: Insufficient documentation

## 2016-10-14 NOTE — Assessment & Plan Note (Signed)
Concern for UTI. Needs order for I and O cath at her home with U/A and urine culture to check for infection. Previously pan sensitive culture in August but will check to be sure. Noted allergy to penicillin if she needs treatment. This would be 3 UTI in last 2 months so have asked her if infection to return to her urologist so that they can talk about prophylaxis antibiotics to prevent infection especially since she does not get classic symptoms.

## 2016-10-14 NOTE — Assessment & Plan Note (Signed)
Suspect from GERD and esophageal irritation. Large foods and pills are getting stuck. Rx for protonix and if no improvement in 2-4 weeks will need barium swallow to check for other etiology.

## 2016-10-15 DIAGNOSIS — B9620 Unspecified Escherichia coli [E. coli] as the cause of diseases classified elsewhere: Secondary | ICD-10-CM | POA: Diagnosis not present

## 2016-10-15 DIAGNOSIS — N390 Urinary tract infection, site not specified: Secondary | ICD-10-CM | POA: Diagnosis not present

## 2016-10-18 ENCOUNTER — Telehealth: Payer: Self-pay | Admitting: Internal Medicine

## 2016-10-18 MED ORDER — NITROFURANTOIN MONOHYD MACRO 100 MG PO CAPS: 100.0000 mg | ORAL_CAPSULE | Freq: Two times a day (BID) | ORAL | 0 refills | Status: DC

## 2016-10-18 NOTE — Telephone Encounter (Signed)
Please call and let her know she does have UTI and we have sent in Assaria. 1 pill twice a day for 2 weeks.

## 2016-10-18 NOTE — Telephone Encounter (Signed)
Unable to reach patient. I had to leave a voice mail informing her that we have sent in an antibiotic.

## 2016-10-19 NOTE — Telephone Encounter (Signed)
Left second message informing patient that she has a UTI and she has an antibiotic to pick up at the pharmacy.

## 2016-10-23 DIAGNOSIS — B9620 Unspecified Escherichia coli [E. coli] as the cause of diseases classified elsewhere: Secondary | ICD-10-CM | POA: Diagnosis not present

## 2016-10-23 DIAGNOSIS — N390 Urinary tract infection, site not specified: Secondary | ICD-10-CM | POA: Diagnosis not present

## 2016-10-25 ENCOUNTER — Encounter: Payer: Self-pay | Admitting: Internal Medicine

## 2016-10-25 LAB — URINALYSIS
BILIRUBIN UA: NEGATIVE
Glucose, UA: NEGATIVE
Ketones, UA: NEGATIVE
Nitrites: POSITIVE
PH UA: 6.5 (ref 4.5–8.0)
SPECIFIC GRAVITY, URINE: 1.017
Urobilinogen, UA: NORMAL

## 2016-11-02 DIAGNOSIS — N390 Urinary tract infection, site not specified: Secondary | ICD-10-CM | POA: Diagnosis not present

## 2016-11-02 DIAGNOSIS — B9620 Unspecified Escherichia coli [E. coli] as the cause of diseases classified elsewhere: Secondary | ICD-10-CM | POA: Diagnosis not present

## 2016-11-13 DIAGNOSIS — B9620 Unspecified Escherichia coli [E. coli] as the cause of diseases classified elsewhere: Secondary | ICD-10-CM | POA: Diagnosis not present

## 2016-11-13 DIAGNOSIS — N390 Urinary tract infection, site not specified: Secondary | ICD-10-CM | POA: Diagnosis not present

## 2016-11-19 DIAGNOSIS — N3944 Nocturnal enuresis: Secondary | ICD-10-CM | POA: Diagnosis not present

## 2016-11-19 DIAGNOSIS — N3942 Incontinence without sensory awareness: Secondary | ICD-10-CM | POA: Diagnosis not present

## 2016-11-19 DIAGNOSIS — R350 Frequency of micturition: Secondary | ICD-10-CM | POA: Diagnosis not present

## 2016-11-21 DIAGNOSIS — F40298 Other specified phobia: Secondary | ICD-10-CM | POA: Diagnosis not present

## 2016-11-21 DIAGNOSIS — G8929 Other chronic pain: Secondary | ICD-10-CM | POA: Diagnosis not present

## 2016-11-21 DIAGNOSIS — D8689 Sarcoidosis of other sites: Secondary | ICD-10-CM | POA: Diagnosis not present

## 2016-11-26 DIAGNOSIS — N390 Urinary tract infection, site not specified: Secondary | ICD-10-CM | POA: Diagnosis not present

## 2016-11-26 DIAGNOSIS — B9620 Unspecified Escherichia coli [E. coli] as the cause of diseases classified elsewhere: Secondary | ICD-10-CM | POA: Diagnosis not present

## 2016-11-27 ENCOUNTER — Ambulatory Visit: Payer: Medicare Other | Admitting: Internal Medicine

## 2016-11-27 DIAGNOSIS — N3946 Mixed incontinence: Secondary | ICD-10-CM | POA: Diagnosis not present

## 2016-12-04 DIAGNOSIS — R350 Frequency of micturition: Secondary | ICD-10-CM | POA: Diagnosis not present

## 2016-12-05 DIAGNOSIS — N390 Urinary tract infection, site not specified: Secondary | ICD-10-CM | POA: Diagnosis not present

## 2016-12-05 DIAGNOSIS — B9620 Unspecified Escherichia coli [E. coli] as the cause of diseases classified elsewhere: Secondary | ICD-10-CM | POA: Diagnosis not present

## 2016-12-06 DIAGNOSIS — N390 Urinary tract infection, site not specified: Secondary | ICD-10-CM | POA: Diagnosis not present

## 2016-12-06 DIAGNOSIS — B9620 Unspecified Escherichia coli [E. coli] as the cause of diseases classified elsewhere: Secondary | ICD-10-CM | POA: Diagnosis not present

## 2016-12-11 DIAGNOSIS — N3946 Mixed incontinence: Secondary | ICD-10-CM | POA: Diagnosis not present

## 2016-12-12 ENCOUNTER — Other Ambulatory Visit (INDEPENDENT_AMBULATORY_CARE_PROVIDER_SITE_OTHER): Payer: Medicare Other

## 2016-12-12 ENCOUNTER — Ambulatory Visit (INDEPENDENT_AMBULATORY_CARE_PROVIDER_SITE_OTHER): Payer: Medicare Other | Admitting: Internal Medicine

## 2016-12-12 ENCOUNTER — Other Ambulatory Visit: Payer: Self-pay | Admitting: Internal Medicine

## 2016-12-12 ENCOUNTER — Encounter: Payer: Self-pay | Admitting: Internal Medicine

## 2016-12-12 VITALS — BP 100/60 | HR 72 | Temp 97.9°F | Resp 12 | Ht 64.0 in | Wt 117.0 lb

## 2016-12-12 DIAGNOSIS — Z1231 Encounter for screening mammogram for malignant neoplasm of breast: Secondary | ICD-10-CM

## 2016-12-12 DIAGNOSIS — F32A Depression, unspecified: Secondary | ICD-10-CM | POA: Insufficient documentation

## 2016-12-12 DIAGNOSIS — R1310 Dysphagia, unspecified: Secondary | ICD-10-CM | POA: Diagnosis not present

## 2016-12-12 DIAGNOSIS — F329 Major depressive disorder, single episode, unspecified: Secondary | ICD-10-CM | POA: Insufficient documentation

## 2016-12-12 DIAGNOSIS — R21 Rash and other nonspecific skin eruption: Secondary | ICD-10-CM

## 2016-12-12 DIAGNOSIS — E785 Hyperlipidemia, unspecified: Secondary | ICD-10-CM | POA: Diagnosis not present

## 2016-12-12 DIAGNOSIS — E039 Hypothyroidism, unspecified: Secondary | ICD-10-CM | POA: Diagnosis not present

## 2016-12-12 DIAGNOSIS — F341 Dysthymic disorder: Secondary | ICD-10-CM

## 2016-12-12 LAB — COMPREHENSIVE METABOLIC PANEL
ALBUMIN: 4 g/dL (ref 3.5–5.2)
ALK PHOS: 58 U/L (ref 39–117)
ALT: 7 U/L (ref 0–35)
AST: 15 U/L (ref 0–37)
BUN: 23 mg/dL (ref 6–23)
CO2: 27 mEq/L (ref 19–32)
Calcium: 9.4 mg/dL (ref 8.4–10.5)
Chloride: 110 mEq/L (ref 96–112)
Creatinine, Ser: 0.83 mg/dL (ref 0.40–1.20)
GFR: 90.41 mL/min (ref >60.00)
GLUCOSE: 84 mg/dL (ref 70–99)
POTASSIUM: 4.4 meq/L (ref 3.5–5.1)
Sodium: 145 mEq/L (ref 135–145)
TOTAL PROTEIN: 6.8 g/dL (ref 6.0–8.3)
Total Bilirubin: 0.2 mg/dL (ref 0.2–1.2)

## 2016-12-12 LAB — CBC
HEMATOCRIT: 33.5 % — AB (ref 36.0–46.0)
HEMOGLOBIN: 11.4 g/dL — AB (ref 12.0–15.0)
MCHC: 34 g/dL (ref 30.0–36.0)
MCV: 81.2 fl (ref 78.0–100.0)
Platelets: 320 10*3/uL (ref 150.0–400.0)
RBC: 4.13 Mil/uL (ref 3.87–5.11)
RDW: 18.7 % — AB (ref 11.5–15.5)
WBC: 8.1 10*3/uL (ref 4.0–10.5)

## 2016-12-12 LAB — LIPID PANEL
CHOLESTEROL: 211 mg/dL — AB (ref 0–200)
HDL: 60.9 mg/dL (ref >39.00)
LDL Cholesterol: 121 mg/dL — ABNORMAL HIGH (ref 0–99)
NonHDL: 150.37
TRIGLYCERIDES: 149 mg/dL (ref 0.0–149.0)
Total CHOL/HDL Ratio: 3
VLDL: 29.8 mg/dL (ref 0.0–40.0)

## 2016-12-12 LAB — TSH: TSH: 9.85 u[IU]/mL — ABNORMAL HIGH (ref 0.35–4.50)

## 2016-12-12 LAB — T4, FREE: Free T4: 0.95 ng/dL (ref 0.60–1.60)

## 2016-12-12 MED ORDER — FLUOXETINE HCL 40 MG PO CAPS: 40.0000 mg | ORAL_CAPSULE | Freq: Every day | ORAL | 3 refills | Status: DC

## 2016-12-12 MED ORDER — BETAMETHASONE DIPROPIONATE 0.05 % EX CREA
TOPICAL_CREAM | Freq: Two times a day (BID) | CUTANEOUS | 0 refills | Status: DC
Start: 2016-12-12 — End: 2017-05-10

## 2016-12-12 MED ORDER — TRAMADOL HCL 50 MG PO TABS: 50.0000 mg | ORAL_TABLET | Freq: Three times a day (TID) | ORAL | 0 refills | Status: DC | PRN

## 2016-12-12 NOTE — Progress Notes (Signed)
Pre visit review using our clinic review tool, if applicable. No additional management support is needed unless otherwise documented below in the visit note. 

## 2016-12-12 NOTE — Patient Instructions (Signed)
We have given you a prescription for the pain medicine called tramadol which you can take 1 for pain at night time in your hip.  We have sent in a cream to use on the ear and the foot for the crusting.   Try the DMSO cream on the hip as well to see if this helps with the pain.   We are checking the labs today for the thyroid and will call you back with the results.

## 2016-12-12 NOTE — Assessment & Plan Note (Signed)
Rx for betamethasone cream for the ear crusting. Likely an allergic reaction from earrings on her ears. No abscess on exam today.

## 2016-12-12 NOTE — Assessment & Plan Note (Signed)
Doing better, no complaints about this since starting the protonix so she will continue on this.

## 2016-12-12 NOTE — Progress Notes (Signed)
   Subjective:    Patient ID: Jamie [name], female    DOB: 26-Nov-1956, 60 y.o.   MRN: [id number]  HPI The patient is a 60 YO female coming in for follow up of her medical conditions including her thyroid (she is losing some weight in the last several months, has chronic tremors and gait problem from her other medical condition, denies diarrhea or constipation), recently did a trial of levodopa from her neurologist to help with her gait symptoms. She is now having a new problems of some skin problems with her feet and creases of her legs (wore some earrings and then afterwards some reaction in that area with crusting, they have been using antibiotic ointment on the area, some clear fluid drainage but no pain), and also some more problems with depression (she has been on prozac in the past, she admits to feeling more sadness and moodiness in the last few months, usually the winter months affect her some, no side effects with prozac in the past) .   Review of Systems  Constitutional: Positive for activity change. Negative for appetite change, chills, fatigue, fever and unexpected weight change.  HENT: Negative.   Eyes: Negative.   Respiratory: Negative.   Cardiovascular: Negative.   Gastrointestinal: Negative.   Musculoskeletal: Positive for arthralgias and gait problem. Negative for back pain, joint swelling, myalgias, neck pain and neck stiffness.  Skin: Positive for color change and rash. Negative for pallor and wound.  Neurological: Positive for weakness and numbness. Negative for dizziness, syncope, light-headedness and headaches.  Psychiatric/Behavioral: Positive for decreased concentration and dysphoric mood. Negative for confusion, hallucinations and sleep disturbance. The patient is not nervous/anxious and is not hyperactive.       Objective:   Physical Exam  Constitutional: She is oriented to person, place, and time. She appears well-developed and well-nourished.  HENT:  Head:  Normocephalic and atraumatic.  Bilateral ear lobes with crusting and some serous discharge. No pain on exam.   Eyes: EOM are normal.  Neck: Normal range of motion.  Cardiovascular: Normal rate and regular rhythm.   Pulmonary/Chest: Effort normal and breath sounds normal. No respiratory distress. She has no wheezes. She has no rales.  Abdominal: Soft. She exhibits no distension. There is no tenderness. There is no rebound.  Musculoskeletal: She exhibits no edema.  Neurological: She is alert and oriented to person, place, and time.  Skin: Skin is warm and dry.  Psychiatric:  Some decreased mood, flat affect   Vitals:   12/12/16 0829  BP: 100/60  Pulse: 72  Resp: 12  Temp: 97.9 F (36.6 C)  TempSrc: Oral  SpO2: 99%  Weight: 117 lb (53.1 kg)  Height: 5\' 4"  (1.626 m)      Assessment & Plan:

## 2016-12-12 NOTE — Assessment & Plan Note (Signed)
Rx for prozac to use for her seasonal depression. She has had this before and taken prozac with good results. She will call back with any side effects.

## 2016-12-18 DIAGNOSIS — R350 Frequency of micturition: Secondary | ICD-10-CM | POA: Diagnosis not present

## 2016-12-18 DIAGNOSIS — N3946 Mixed incontinence: Secondary | ICD-10-CM | POA: Diagnosis not present

## 2016-12-25 ENCOUNTER — Encounter: Payer: Self-pay | Admitting: Physical Medicine & Rehabilitation

## 2016-12-25 ENCOUNTER — Encounter: Payer: Medicare Other | Attending: Physical Medicine & Rehabilitation

## 2016-12-25 ENCOUNTER — Ambulatory Visit (HOSPITAL_BASED_OUTPATIENT_CLINIC_OR_DEPARTMENT_OTHER): Payer: Medicare Other | Admitting: Physical Medicine & Rehabilitation

## 2016-12-25 VITALS — BP 98/64 | HR 95 | Resp 14

## 2016-12-25 DIAGNOSIS — X58XXXD Exposure to other specified factors, subsequent encounter: Secondary | ICD-10-CM | POA: Insufficient documentation

## 2016-12-25 DIAGNOSIS — F329 Major depressive disorder, single episode, unspecified: Secondary | ICD-10-CM | POA: Diagnosis not present

## 2016-12-25 DIAGNOSIS — S3210XD Unspecified fracture of sacrum, subsequent encounter for fracture with routine healing: Secondary | ICD-10-CM | POA: Diagnosis not present

## 2016-12-25 DIAGNOSIS — N319 Neuromuscular dysfunction of bladder, unspecified: Secondary | ICD-10-CM | POA: Insufficient documentation

## 2016-12-25 DIAGNOSIS — R350 Frequency of micturition: Secondary | ICD-10-CM | POA: Diagnosis not present

## 2016-12-25 DIAGNOSIS — D8689 Sarcoidosis of other sites: Secondary | ICD-10-CM

## 2016-12-25 DIAGNOSIS — Z79899 Other long term (current) drug therapy: Secondary | ICD-10-CM | POA: Diagnosis not present

## 2016-12-25 DIAGNOSIS — G8929 Other chronic pain: Secondary | ICD-10-CM | POA: Diagnosis not present

## 2016-12-25 DIAGNOSIS — F419 Anxiety disorder, unspecified: Secondary | ICD-10-CM | POA: Insufficient documentation

## 2016-12-25 DIAGNOSIS — S3422XS Injury of nerve root of sacral spine, sequela: Secondary | ICD-10-CM | POA: Diagnosis not present

## 2016-12-25 DIAGNOSIS — K592 Neurogenic bowel, not elsewhere classified: Secondary | ICD-10-CM | POA: Insufficient documentation

## 2016-12-25 DIAGNOSIS — M79651 Pain in right thigh: Secondary | ICD-10-CM | POA: Insufficient documentation

## 2016-12-25 DIAGNOSIS — N312 Flaccid neuropathic bladder, not elsewhere classified: Secondary | ICD-10-CM | POA: Diagnosis not present

## 2016-12-25 DIAGNOSIS — R269 Unspecified abnormalities of gait and mobility: Secondary | ICD-10-CM | POA: Diagnosis not present

## 2016-12-25 DIAGNOSIS — H548 Legal blindness, as defined in USA: Secondary | ICD-10-CM | POA: Insufficient documentation

## 2016-12-25 DIAGNOSIS — G6289 Other specified polyneuropathies: Secondary | ICD-10-CM | POA: Diagnosis not present

## 2016-12-25 DIAGNOSIS — F068 Other specified mental disorders due to known physiological condition: Secondary | ICD-10-CM

## 2016-12-25 DIAGNOSIS — Z8249 Family history of ischemic heart disease and other diseases of the circulatory system: Secondary | ICD-10-CM | POA: Diagnosis not present

## 2016-12-25 DIAGNOSIS — M79659 Pain in unspecified thigh: Secondary | ICD-10-CM | POA: Diagnosis present

## 2016-12-25 DIAGNOSIS — S3422XD Injury of nerve root of sacral spine, subsequent encounter: Secondary | ICD-10-CM | POA: Insufficient documentation

## 2016-12-25 DIAGNOSIS — E079 Disorder of thyroid, unspecified: Secondary | ICD-10-CM | POA: Diagnosis not present

## 2016-12-25 MED ORDER — GABAPENTIN 300 MG PO CAPS: 300.0000 mg | ORAL_CAPSULE | Freq: Two times a day (BID) | ORAL | 1 refills | Status: DC

## 2016-12-25 NOTE — Progress Notes (Signed)
Subjective:    Patient ID: Jamie [name], female    DOB: [birth year], 61 y.o.   MRN: [id number]  HPI Some improvement with fear of falling after working with Dr Lyman Speller for several session Has sharp localized pain in back of thigh mainly with walking, sometimes has similar sensation with prolonged sitting. PENS  sessions for tibial nerve stimulation 1x per week, on treatment 5/12, 1/2 hour sessions No suppository or laxatives for bowels , occ incont uses brief Patient uses a walker for the most part, sometimes trying to use a cane encouraged by her caregiver. 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    Pain Inventory Average Pain 2 Pain Right Now 2 My pain is intermittent and sharp  In the last 24 hours, has pain interfered with the following? General activity 9 Relation with others 0 Enjoyment of life 1 What TIME of day is your pain at its worst? varies Sleep (in general) Fair  Pain is worse with: walking Pain improves with: nothing Relief from Meds: 0  Mobility walk with assistance use a walker ability to climb steps?  yes do you drive?  no needs help with transfers  Function retired  Neuro/Psych bladder control problems numbness trouble walking  Prior Studies Any changes since last visit?  no  Physicians involved in your care Any changes since last visit?  no   Family History  Problem Relation Age of Onset  . Hypertension Father    Social History   Social History  . Marital status: Single    Spouse name: N/A  . Number of children: N/A  . Years of education: N/A   Social History Main Topics  . Smoking status: Never Smoker  . Smokeless tobacco: Never Used  . Alcohol use No  . Drug use: No  . Sexual activity: Not Asked   Other Topics Concern  . None   Social History Narrative  . None   History reviewed. No pertinent surgical history. Past Medical History:  Diagnosis Date  . Depression   . 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 Progressive gait disorder    Was treated for probable MS until diagnosed with Neurosarcoidosis about 10 years ago.   . Thyroid disease    BP 98/64   Pulse 95   Resp 14   SpO2 96%   Opioid Risk Score:   Fall Risk Score:  `1  Depression screen PHQ 2/9  Depression screen [facility] 2/9 07/12/2015 07/12/2015  Decreased Interest 0 0  Down, Depressed, Hopeless 0 0  PHQ - 2 Score 0 0  Altered sleeping 0 -  Tired, decreased energy 1 -  Change in appetite 0 -  Feeling bad or failure about yourself  0 -  Trouble concentrating 0 -  Moving slowly or fidgety/restless 3 -  Suicidal thoughts 0 -  PHQ-9 Score 4 -    Review of Systems  Constitutional: Negative.   HENT: Negative.   Eyes: Negative.   Respiratory: Negative.   Cardiovascular: Negative.   Gastrointestinal: Negative.   Endocrine: Negative.   Genitourinary:       Bladder control problems  Musculoskeletal: Positive for gait problem.  Neurological: Positive for numbness.  Hematological: Negative.   All other systems reviewed and are negative.      Objective:   Physical Exam  Constitutional: She appears well-developed and well-nourished.  HENT:  Legally blind, unable to see large objects  Eyes: Conjunctivae and EOM are normal. Pupils are equal, round, and reactive to light.  Neck: Normal range of motion.  Neurological: She is alert. She exhibits abnormal muscle tone. Gait abnormal.  decreased sensation in the toes of the left foot. There is some hypersensitivity to touch in the plantar midfoot, left greater than right.  Motor strength is 5/5 bilateral hip flexor, knee extensor, ankle dorsiflexor, plantar flexor   Skin: Skin is warm and dry.  Psychiatric: She has a normal mood and affect. Her speech is normal. She exhibits abnormal recent memory.  Nursing note and vitals reviewed.         Assessment  & Plan:  1. 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 She is chronic pain in the sacral dermatomes. The burning pain she describes is likely neuropathic. She is currently on gabapentin 100 mg 3 times a day Will increase it to 300 mg twice a day. Continue tramadol 50 mg 2-3 times per day. 2. 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 She follows with urology for her bladder. She is getting tibial nerve stimulation. This is to promote bladder emptying. She is midway through her treatments. She has poor sensation for bowel movements due to sacral nerve injury.  3. Neurosarcoidosis. Follow-up with neurology #4. Anxiety, fear of falling. Follow-up with neuropsychology

## 2016-12-25 NOTE — Patient Instructions (Signed)
Would recommend using walker rather than cane

## 2017-01-01 ENCOUNTER — Ambulatory Visit
Admission: RE | Admit: 2017-01-01 | Discharge: 2017-01-01 | Disposition: A | Discharge status: Home / Self Care | Payer: Medicare Other | Source: Ambulatory Visit | Attending: Internal Medicine | Admitting: Internal Medicine

## 2017-01-01 DIAGNOSIS — R350 Frequency of micturition: Secondary | ICD-10-CM | POA: Diagnosis not present

## 2017-01-01 DIAGNOSIS — Z1231 Encounter for screening mammogram for malignant neoplasm of breast: Secondary | ICD-10-CM

## 2017-01-08 DIAGNOSIS — R350 Frequency of micturition: Secondary | ICD-10-CM | POA: Diagnosis not present

## 2017-01-15 DIAGNOSIS — R350 Frequency of micturition: Secondary | ICD-10-CM | POA: Diagnosis not present

## 2017-01-15 DIAGNOSIS — N3946 Mixed incontinence: Secondary | ICD-10-CM | POA: Diagnosis not present

## 2017-01-22 DIAGNOSIS — R350 Frequency of micturition: Secondary | ICD-10-CM | POA: Diagnosis not present

## 2017-01-29 DIAGNOSIS — R350 Frequency of micturition: Secondary | ICD-10-CM | POA: Diagnosis not present

## 2017-02-05 DIAGNOSIS — R350 Frequency of micturition: Secondary | ICD-10-CM | POA: Diagnosis not present

## 2017-02-12 DIAGNOSIS — R350 Frequency of micturition: Secondary | ICD-10-CM | POA: Diagnosis not present

## 2017-02-12 DIAGNOSIS — N3942 Incontinence without sensory awareness: Secondary | ICD-10-CM | POA: Diagnosis not present

## 2017-02-12 DIAGNOSIS — N319 Neuromuscular dysfunction of bladder, unspecified: Secondary | ICD-10-CM | POA: Diagnosis not present

## 2017-02-18 DIAGNOSIS — D8689 Sarcoidosis of other sites: Secondary | ICD-10-CM | POA: Diagnosis not present

## 2017-02-18 DIAGNOSIS — H04123 Dry eye syndrome of bilateral lacrimal glands: Secondary | ICD-10-CM | POA: Diagnosis not present

## 2017-03-05 DIAGNOSIS — R310 Gross hematuria: Secondary | ICD-10-CM | POA: Diagnosis not present

## 2017-03-05 DIAGNOSIS — N319 Neuromuscular dysfunction of bladder, unspecified: Secondary | ICD-10-CM | POA: Diagnosis not present

## 2017-03-05 DIAGNOSIS — N3020 Other chronic cystitis without hematuria: Secondary | ICD-10-CM | POA: Diagnosis not present

## 2017-03-05 DIAGNOSIS — R350 Frequency of micturition: Secondary | ICD-10-CM | POA: Diagnosis not present

## 2017-05-03 DIAGNOSIS — R310 Gross hematuria: Secondary | ICD-10-CM | POA: Diagnosis not present

## 2017-05-06 ENCOUNTER — Other Ambulatory Visit: Payer: Self-pay | Admitting: Urology

## 2017-05-06 DIAGNOSIS — R310 Gross hematuria: Secondary | ICD-10-CM | POA: Diagnosis not present

## 2017-05-06 DIAGNOSIS — D414 Neoplasm of uncertain behavior of bladder: Secondary | ICD-10-CM | POA: Diagnosis not present

## 2017-05-06 DIAGNOSIS — N319 Neuromuscular dysfunction of bladder, unspecified: Secondary | ICD-10-CM | POA: Diagnosis not present

## 2017-05-06 DIAGNOSIS — N3020 Other chronic cystitis without hematuria: Secondary | ICD-10-CM | POA: Diagnosis not present

## 2017-05-07 ENCOUNTER — Ambulatory Visit: Payer: Medicare Other | Admitting: Internal Medicine

## 2017-05-09 ENCOUNTER — Other Ambulatory Visit: Payer: Self-pay | Admitting: Urology

## 2017-05-10 ENCOUNTER — Encounter: Payer: Self-pay | Admitting: Internal Medicine

## 2017-05-10 ENCOUNTER — Ambulatory Visit (INDEPENDENT_AMBULATORY_CARE_PROVIDER_SITE_OTHER): Payer: Medicare Other | Admitting: Internal Medicine

## 2017-05-10 ENCOUNTER — Encounter: Payer: Self-pay | Admitting: Neurology

## 2017-05-10 VITALS — BP 110/64 | HR 69 | Temp 97.9°F | Resp 12 | Ht 64.0 in | Wt 115.8 lb

## 2017-05-10 DIAGNOSIS — F068 Other specified mental disorders due to known physiological condition: Secondary | ICD-10-CM

## 2017-05-10 DIAGNOSIS — M5416 Radiculopathy, lumbar region: Secondary | ICD-10-CM

## 2017-05-10 DIAGNOSIS — D8689 Sarcoidosis of other sites: Secondary | ICD-10-CM

## 2017-05-10 DIAGNOSIS — G6289 Other specified polyneuropathies: Secondary | ICD-10-CM

## 2017-05-10 DIAGNOSIS — G629 Polyneuropathy, unspecified: Secondary | ICD-10-CM | POA: Insufficient documentation

## 2017-05-10 MED ORDER — FLUOXETINE HCL 20 MG PO TABS: 20.0000 mg | ORAL_TABLET | Freq: Every day | ORAL | 3 refills | Status: DC

## 2017-05-10 NOTE — Assessment & Plan Note (Addendum)
Painless RLE weakness apparently new, and without fever; ? Onset as ambulation has been gradually worsening since last PT at 1 yr; ok for LS Spine MRI, to also take sinamet tid (not bid) and refer Dr Carles Collet - neurology (father Is 60yo and still drives but unable to make it back to Dr Dimas Millin - [facility] neurology); also for retrial PT and f/u Dr Delynn Flavin as planned July 2018  Note:  Total time for pt hx, exam, review of record with pt in the room, determination of diagnoses and plan for further eval and tx is > 40 min, with over 50% spent in coordination and counseling of patient, caretaker and father regarding exam findings, differential diagnosis, further evaluaton and treatment

## 2017-05-10 NOTE — Patient Instructions (Addendum)
OK to restart the prozac at 20 mg per day  Please take the Sinamet (carbidopa-levodopa) three times as prescribed  Please make sure to follow up with Dr Bonney Aid for the question of proper shoes  You will be contacted regarding the referral for: [place] with RN and physcial therapy at home  You will be contacted regarding the referral for: MRI for the lower back  You will be contacted regarding the referral for: Neurology (Dr Tat)  Please continue all other medications as before, and refills have been done if requested.  Please have the pharmacy call with any other refills you may need.  Please keep your appointments with your specialists as you may have planned

## 2017-05-10 NOTE — Progress Notes (Signed)
Subjective:    Patient ID: Jamie [name], female    DOB: Feb 02, 1956, 61 y.o.   MRN: [id number]  HPI  Here to f/u with me as PCP has become ill today, presents with caretaker who is quite knowledgable and supportive and gives most of hx confirmed by patient; also present is her 95yo father who walks with cane, still drives and appears to be spry for age.  Initial complaint was of "right foot pain" but on further hx, the actual concern is of 1 yr gradually worsening gait (without falls) with walker at home which is worse in last several wks (not sure how long) of RLE weakness, which has resulted in change in gait and seeming less able to use right foot as well as in past.  Caretaker is wondering about the shoes and whether this is causing the problem, asking for some kind of referral for more appropriate shoes.  Pt has known neurosarcoid and c/o symptoms c/w peripheral neuropathy today with numb distal legs and feet chronically today.  Pt has been walker dependent since 2016.  Last PT course about 1 yr ago and stopped due to non progression.  Pt sees Dr Sherral Hammers medicine locally,.  Has also seen Dr Dimas Millin.neurology at [facility], but no longer able as father now 58yo no longer feels safe to make that drive.  Caretaker admits pt has only been taking the Sinamet bid instead of tid mostly b/c she is only able to come to see her twice per day, but thinks maybe pt can be called and reminded to take med on her own.  In addition, pt has been more emotional with low mood and nervousness, has been on prozac 40 mg in past and not sure why or when was stopped as caretaker does not recognize this med.  Also of note, pt has chronic bladder incontinence and recurrent UTI, has f/u appt with Urology next wk for what sounds like planned cysto with biopsy. Past Medical History:  Diagnosis Date  . Depression   . 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 Progressive gait disorder    Was treated for probable MS until diagnosed with Neurosarcoidosis about 10 years ago.   . Thyroid disease    No past surgical history on file.  reports that she has never smoked. She has never used smokeless tobacco. She reports that she does not drink alcohol or use drugs. family history includes Hypertension in her father. Allergies  Allergen Reactions  . Remicade [Infliximab]     shingles  . Penicillins Rash   Current Outpatient Prescriptions on File Prior to Visit  Medication Sig Dispense Refill  . acetaminophen (TYLENOL) 325 MG tablet Take 1-2 tablets (325-650 mg total) by mouth every 4 (four) hours as needed for mild pain.    . Ascorbic Acid (VITAMIN C) 100 MG tablet Take 100 mg by mouth daily.    . carbidopa-levodopa (SINEMET IR) 25-100 MG tablet Take 1 tablet by mouth 2 (two) times daily. Reported on 06/25/2016    . gabapentin (NEURONTIN) 300 MG capsule Take 1 capsule (300 mg total) by mouth 2 (two) times daily. 180 capsule 1  . levothyroxine (SYNTHROID, LEVOTHROID) 100 MCG tablet TAKE ONE TABLET BY MOUTH ONCE DAILY 90 tablet 3  . Multiple Vitamin (MULTIVITAMIN WITH MINERALS) TABS tablet Take 1 tablet by mouth daily.    . pantoprazole (PROTONIX)  40 MG tablet Take 1 tablet (40 mg total) by mouth daily. 30 tablet 3  . thiamine (VITAMIN B-1) 100 MG tablet Take 100 mg by mouth daily.     No current facility-administered medications on file prior to visit.    Review of Systems  Constitutional: Negative for other unusual diaphoresis or sweats HENT: Negative for ear discharge or swelling Eyes: Negative for other worsening visual disturbances Respiratory: Negative for stridor or other swelling  Gastrointestinal: Negative for worsening distension or other blood Genitourinary: Negative for retention or other urinary change Musculoskeletal: Negative for other MSK pain or swelling Skin: Negative for color change or  other new lesions Neurological: Negative for worsening tremors  Psychiatric/Behavioral: Negative for worsening agitation or other fatigue All other system neg per pt    Objective:   Physical Exam BP 110/64 (BP Location: Left Arm, Patient Position: Sitting, Cuff Size: Normal)   Pulse 69   Temp 97.9 F (36.6 C) (Oral)   Resp 12   Ht 5\' 4"  (1.626 m)   Wt 115 lb 12 oz (52.5 kg)   SpO2 97%   BMI 19.87 kg/m  VS noted, mild low wt, examined in wheelchair, chronically ill appearing Constitutional: Pt appears in NAD HENT: Head: NCAT.  Right Ear: External ear normal.  Left Ear: External ear normal.  Eyes: . Pupils are equal, round, and reactive to light. Conjunctivae and EOM are normal Nose: without d/c or deformity Neck: Neck supple. Gross normal ROM Cardiovascular: Normal rate and regular rhythm.   Pulmonary/Chest: Effort normal and breath sounds without rales or wheezing.  Abd:  Soft, NT, ND, + BS, no organomegaly Neurological: Pt is alert. At baseline orientation, motor 4-4+/5 RLE but 5/5 intact to other extremities with I suspect moderate gen'd weakness, cn 2-12 intact Skin: Skin is warm. No rashes, other new lesions, no LE edema Psychiatric: Pt behavior is normal without agitation  No other exam findings Lab Results  Component Value Date   WBC 8.1 12/12/2016   HGB 11.4 (L) 12/12/2016   HCT 33.5 (L) 12/12/2016   PLT 320.0 12/12/2016   GLUCOSE 84 12/12/2016   CHOL 211 (H) 12/12/2016   TRIG 149.0 12/12/2016   HDL 60.90 12/12/2016   LDLCALC 121 (H) 12/12/2016   ALT 7 12/12/2016   AST 15 12/12/2016   NA 145 12/12/2016   K 4.4 12/12/2016   CL 110 12/12/2016   CREATININE 0.83 12/12/2016   BUN 23 12/12/2016   CO2 27 12/12/2016   TSH 9.85 (H) 12/12/2016       Assessment & Plan:

## 2017-05-11 NOTE — Assessment & Plan Note (Signed)
Noted, likely a factor in any further attempt at eval and tx

## 2017-05-11 NOTE — Assessment & Plan Note (Signed)
Ok to restart the prozac at 20 mg, f/u with PCP as well

## 2017-05-11 NOTE — Assessment & Plan Note (Signed)
Due to father's health and advanced age and transporation issues, pt and those present are requesting local Neurology referral

## 2017-05-14 NOTE — Patient Instructions (Addendum)
Jamie [name]  05/14/2017   Your procedure is scheduled on: 05-17-17  Report to [facility] Main  Entrance Take [place]  elevators to 3rd floor to  [place] at     815  AM.    Call this number if you have problems the morning of surgery [phone]    Remember: ONLY 1 PERSON MAY GO WITH YOU TO SHORT STAY TO GET  READY MORNING OF [place].   Do not eat food or drink liquids :After Midnight.     Take these medicines the morning of surgery with A SIP OF WATER: CARBIDOPA-LEVIDOPA(SINIMET IR), FLUOXETIN (PROZAC), GAPAPENTIN (NEURONTIN), LEVOTHYROXINE (SYNTHROID), PANTAPRAZOLE (PROTONIX)                                You may not have any metal on your body including hair pins and              piercings  Do not wear jewelry, make-up, lotions, powders or perfumes, deodorant             Do not wear nail polish.  Do not shave  48 hours prior to surgery.               Do not bring valuables to the hospital. [place].  Contacts, dentures or bridgework may not be worn into surgery.       Patients discharged the day of surgery will not be allowed to drive home.  Name and phone number of your driver:  Special Instructions: N/A              Please read over the following fact sheets you were given: _____________________________________________________________________             [facility] - Preparing for Surgery Before surgery, you can play an important role.  Because skin is not sterile, your skin needs to be as free of germs as possible.  You can reduce the number of germs on your skin by washing with CHG (chlorahexidine gluconate) soap before surgery.  CHG is an antiseptic cleaner which kills germs and bonds with the skin to continue killing germs even after washing. Please DO NOT use if you have an allergy to CHG or antibacterial soaps.  If your skin becomes reddened/irritated stop using the CHG and inform  your nurse when you arrive at Short Stay. Do not shave (including legs and underarms) for at least 48 hours prior to the first CHG shower.  You may shave your face/neck. Please follow these instructions carefully:  1.  Shower with CHG Soap the night before surgery and the  morning of Surgery.  2.  If you choose to wash your hair, wash your hair first as usual with your  normal  shampoo.  3.  After you shampoo, rinse your hair and body thoroughly to remove the  shampoo.                           4.  Use CHG as you would any other liquid soap.  You can apply chg directly  to the skin and wash  Gently with a scrungie or clean washcloth.  5.  Apply the CHG Soap to your body ONLY FROM THE NECK DOWN.   Do not use on face/ open                           Wound or open sores. Avoid contact with eyes, ears mouth and genitals (private parts).                       Wash face,  Genitals (private parts) with your normal soap.             6.  Wash thoroughly, paying special attention to the area where your surgery  will be performed.  7.  Thoroughly rinse your body with warm water from the neck down.  8.  DO NOT shower/wash with your normal soap after using and rinsing off  the CHG Soap.                9.  Pat yourself dry with a clean towel.            10.  Wear clean pajamas.            11.  Place clean sheets on your bed the night of your first shower and do not  sleep with pets. Day of Surgery : Do not apply any lotions/deodorants the morning of surgery.  Please wear clean clothes to the hospital/surgery center.  FAILURE TO FOLLOW THESE INSTRUCTIONS MAY RESULT IN THE CANCELLATION OF YOUR SURGERY PATIENT SIGNATURE_________________________________  NURSE SIGNATURE__________________________________  ________________________________________________________________________

## 2017-05-15 ENCOUNTER — Encounter (HOSPITAL_COMMUNITY)
Admission: RE | Admit: 2017-05-15 | Discharge: 2017-05-15 | Disposition: A | Discharge status: Home / Self Care | Payer: Medicare Other | Source: Ambulatory Visit | Attending: Urology | Admitting: Urology

## 2017-05-15 ENCOUNTER — Encounter (HOSPITAL_COMMUNITY): Payer: Self-pay

## 2017-05-15 DIAGNOSIS — N329 Bladder disorder, unspecified: Secondary | ICD-10-CM | POA: Diagnosis present

## 2017-05-15 DIAGNOSIS — Z8249 Family history of ischemic heart disease and other diseases of the circulatory system: Secondary | ICD-10-CM | POA: Diagnosis not present

## 2017-05-15 DIAGNOSIS — N318 Other neuromuscular dysfunction of bladder: Secondary | ICD-10-CM | POA: Diagnosis not present

## 2017-05-15 DIAGNOSIS — C7230 Malignant neoplasm of unspecified optic nerve: Secondary | ICD-10-CM | POA: Diagnosis not present

## 2017-05-15 DIAGNOSIS — K219 Gastro-esophageal reflux disease without esophagitis: Secondary | ICD-10-CM | POA: Diagnosis not present

## 2017-05-15 DIAGNOSIS — F329 Major depressive disorder, single episode, unspecified: Secondary | ICD-10-CM | POA: Diagnosis not present

## 2017-05-15 DIAGNOSIS — R32 Unspecified urinary incontinence: Secondary | ICD-10-CM | POA: Diagnosis not present

## 2017-05-15 DIAGNOSIS — Z880 Allergy status to penicillin: Secondary | ICD-10-CM | POA: Diagnosis not present

## 2017-05-15 DIAGNOSIS — N3031 Trigonitis with hematuria: Secondary | ICD-10-CM | POA: Diagnosis not present

## 2017-05-15 DIAGNOSIS — Z888 Allergy status to other drugs, medicaments and biological substances status: Secondary | ICD-10-CM | POA: Diagnosis not present

## 2017-05-15 DIAGNOSIS — E039 Hypothyroidism, unspecified: Secondary | ICD-10-CM | POA: Diagnosis not present

## 2017-05-15 DIAGNOSIS — N2889 Other specified disorders of kidney and ureter: Secondary | ICD-10-CM | POA: Diagnosis not present

## 2017-05-15 DIAGNOSIS — Z87440 Personal history of urinary (tract) infections: Secondary | ICD-10-CM | POA: Diagnosis not present

## 2017-05-15 DIAGNOSIS — R269 Unspecified abnormalities of gait and mobility: Secondary | ICD-10-CM | POA: Diagnosis not present

## 2017-05-15 HISTORY — DX: Unspecified fracture of sacrum, initial encounter for closed fracture: S32.10XA

## 2017-05-15 HISTORY — DX: Hypothyroidism, unspecified: E03.9

## 2017-05-15 LAB — BASIC METABOLIC PANEL
ANION GAP: 8 (ref 5–15)
BUN: 18 mg/dL (ref 6–20)
CO2: 25 mmol/L (ref 22–32)
Calcium: 9.2 mg/dL (ref 8.9–10.3)
Chloride: 110 mmol/L (ref 101–111)
Creatinine, Ser: 0.59 mg/dL (ref 0.44–1.00)
GLUCOSE: 89 mg/dL (ref 65–99)
POTASSIUM: 3.9 mmol/L (ref 3.5–5.1)
Sodium: 143 mmol/L (ref 135–145)

## 2017-05-15 LAB — CBC
HEMATOCRIT: 33.5 % — AB (ref 36.0–46.0)
HEMOGLOBIN: 11.7 g/dL — AB (ref 12.0–15.0)
MCH: 27.6 pg (ref 26.0–34.0)
MCHC: 34.9 g/dL (ref 30.0–36.0)
MCV: 79 fL (ref 78.0–100.0)
Platelets: 363 10*3/uL (ref 150–400)
RBC: 4.24 MIL/uL (ref 3.87–5.11)
RDW: 16.5 % — ABNORMAL HIGH (ref 11.5–15.5)
WBC: 7.9 10*3/uL (ref 4.0–10.5)

## 2017-05-16 MED ORDER — GENTAMICIN SULFATE 40 MG/ML IJ SOLN
5.0000 mg/kg | Freq: Once | INTRAVENOUS | Status: AC
  Administered 2017-05-17: 270 mg via INTRAVENOUS
  Filled 2017-05-16: qty 6.75

## 2017-05-17 ENCOUNTER — Ambulatory Visit (HOSPITAL_COMMUNITY): Payer: Medicare Other

## 2017-05-17 ENCOUNTER — Encounter (HOSPITAL_COMMUNITY)
Admission: RE | Disposition: A | Discharge status: Home / Self Care | Payer: Self-pay | Source: Ambulatory Visit | Attending: Urology

## 2017-05-17 ENCOUNTER — Ambulatory Visit (HOSPITAL_COMMUNITY): Payer: Medicare Other | Admitting: Anesthesiology

## 2017-05-17 ENCOUNTER — Encounter (HOSPITAL_COMMUNITY): Payer: Self-pay

## 2017-05-17 ENCOUNTER — Ambulatory Visit (HOSPITAL_COMMUNITY)
Admission: RE | Admit: 2017-05-17 | Discharge: 2017-05-17 | Disposition: A | Discharge status: Home / Self Care | Payer: Medicare Other | Source: Ambulatory Visit | Attending: Urology | Admitting: Urology

## 2017-05-17 DIAGNOSIS — N3031 Trigonitis with hematuria: Secondary | ICD-10-CM | POA: Diagnosis not present

## 2017-05-17 DIAGNOSIS — N2889 Other specified disorders of kidney and ureter: Secondary | ICD-10-CM | POA: Diagnosis not present

## 2017-05-17 DIAGNOSIS — K219 Gastro-esophageal reflux disease without esophagitis: Secondary | ICD-10-CM | POA: Insufficient documentation

## 2017-05-17 DIAGNOSIS — N318 Other neuromuscular dysfunction of bladder: Secondary | ICD-10-CM | POA: Diagnosis not present

## 2017-05-17 DIAGNOSIS — C7230 Malignant neoplasm of unspecified optic nerve: Secondary | ICD-10-CM | POA: Diagnosis not present

## 2017-05-17 DIAGNOSIS — D494 Neoplasm of unspecified behavior of bladder: Secondary | ICD-10-CM | POA: Diagnosis not present

## 2017-05-17 DIAGNOSIS — N319 Neuromuscular dysfunction of bladder, unspecified: Secondary | ICD-10-CM | POA: Diagnosis not present

## 2017-05-17 DIAGNOSIS — Z419 Encounter for procedure for purposes other than remedying health state, unspecified: Secondary | ICD-10-CM

## 2017-05-17 DIAGNOSIS — R1310 Dysphagia, unspecified: Secondary | ICD-10-CM | POA: Diagnosis not present

## 2017-05-17 DIAGNOSIS — E039 Hypothyroidism, unspecified: Secondary | ICD-10-CM | POA: Insufficient documentation

## 2017-05-17 DIAGNOSIS — Z8249 Family history of ischemic heart disease and other diseases of the circulatory system: Secondary | ICD-10-CM | POA: Diagnosis not present

## 2017-05-17 DIAGNOSIS — R32 Unspecified urinary incontinence: Secondary | ICD-10-CM | POA: Diagnosis not present

## 2017-05-17 DIAGNOSIS — R269 Unspecified abnormalities of gait and mobility: Secondary | ICD-10-CM | POA: Diagnosis not present

## 2017-05-17 DIAGNOSIS — Z880 Allergy status to penicillin: Secondary | ICD-10-CM | POA: Insufficient documentation

## 2017-05-17 DIAGNOSIS — Z87440 Personal history of urinary (tract) infections: Secondary | ICD-10-CM | POA: Insufficient documentation

## 2017-05-17 DIAGNOSIS — F418 Other specified anxiety disorders: Secondary | ICD-10-CM | POA: Diagnosis not present

## 2017-05-17 DIAGNOSIS — F329 Major depressive disorder, single episode, unspecified: Secondary | ICD-10-CM | POA: Diagnosis not present

## 2017-05-17 DIAGNOSIS — R948 Abnormal results of function studies of other organs and systems: Secondary | ICD-10-CM | POA: Diagnosis not present

## 2017-05-17 DIAGNOSIS — Z888 Allergy status to other drugs, medicaments and biological substances status: Secondary | ICD-10-CM | POA: Insufficient documentation

## 2017-05-17 DIAGNOSIS — N3030 Trigonitis without hematuria: Secondary | ICD-10-CM | POA: Diagnosis not present

## 2017-05-17 DIAGNOSIS — N3289 Other specified disorders of bladder: Secondary | ICD-10-CM | POA: Diagnosis not present

## 2017-05-17 DIAGNOSIS — R310 Gross hematuria: Secondary | ICD-10-CM | POA: Diagnosis not present

## 2017-05-17 HISTORY — PX: CYSTOSCOPY WITH BIOPSY: SHX5122

## 2017-05-17 HISTORY — PX: CYSTOSCOPY WITH FULGERATION: SHX6638

## 2017-05-17 SURGERY — CYSTOSCOPY, WITH BIOPSY
Anesthesia: General | Site: Bladder

## 2017-05-17 MED ORDER — LACTATED RINGERS IV SOLN
INTRAVENOUS | Status: DC
  Administered 2017-05-17: 1000 mL via INTRAVENOUS

## 2017-05-17 MED ORDER — TRAMADOL HCL 50 MG PO TABS: 50.0000 mg | ORAL_TABLET | Freq: Four times a day (QID) | ORAL | 0 refills | Status: DC | PRN

## 2017-05-17 MED ORDER — ONDANSETRON HCL 4 MG/2ML IJ SOLN
INTRAMUSCULAR | Status: AC
  Filled 2017-05-17: qty 2

## 2017-05-17 MED ORDER — STERILE WATER FOR IRRIGATION IR SOLN
Status: DC | PRN
  Administered 2017-05-17: 3000 mL via INTRAVESICAL

## 2017-05-17 MED ORDER — FENTANYL CITRATE (PF) 100 MCG/2ML IJ SOLN
INTRAMUSCULAR | Status: AC
  Filled 2017-05-17: qty 2

## 2017-05-17 MED ORDER — STERILE WATER FOR IRRIGATION IR SOLN
Status: DC | PRN
  Administered 2017-05-17: 1000 mL via INTRAVESICAL

## 2017-05-17 MED ORDER — LIDOCAINE 2% (20 MG/ML) 5 ML SYRINGE
INTRAMUSCULAR | Status: AC
  Filled 2017-05-17: qty 5

## 2017-05-17 MED ORDER — SUGAMMADEX SODIUM 200 MG/2ML IV SOLN
INTRAVENOUS | Status: AC
  Filled 2017-05-17: qty 2

## 2017-05-17 MED ORDER — PROPOFOL 10 MG/ML IV BOLUS
INTRAVENOUS | Status: AC
  Filled 2017-05-17: qty 20

## 2017-05-17 MED ORDER — PROPOFOL 10 MG/ML IV BOLUS
INTRAVENOUS | Status: DC | PRN
  Administered 2017-05-17: 80 mg via INTRAVENOUS

## 2017-05-17 MED ORDER — MIDAZOLAM HCL 2 MG/2ML IJ SOLN
INTRAMUSCULAR | Status: AC
  Filled 2017-05-17: qty 2

## 2017-05-17 MED ORDER — FENTANYL CITRATE (PF) 100 MCG/2ML IJ SOLN
INTRAMUSCULAR | Status: DC | PRN
  Administered 2017-05-17: 25 ug via INTRAVENOUS

## 2017-05-17 MED ORDER — DEXAMETHASONE SODIUM PHOSPHATE 10 MG/ML IJ SOLN
INTRAMUSCULAR | Status: AC
  Filled 2017-05-17: qty 1

## 2017-05-17 MED ORDER — ONDANSETRON HCL 4 MG/2ML IJ SOLN
INTRAMUSCULAR | Status: DC | PRN
  Administered 2017-05-17: 4 mg via INTRAVENOUS

## 2017-05-17 MED ORDER — MIDAZOLAM HCL 2 MG/2ML IJ SOLN: 0.5000 mg | Freq: Once | INTRAMUSCULAR | Status: DC | PRN

## 2017-05-17 MED ORDER — IOHEXOL 300 MG/ML  SOLN
INTRAMUSCULAR | Status: DC | PRN
  Administered 2017-05-17: 18 mL via URETHRAL

## 2017-05-17 MED ORDER — PROMETHAZINE HCL 25 MG/ML IJ SOLN: 6.2500 mg | INTRAMUSCULAR | Status: DC | PRN

## 2017-05-17 MED ORDER — FENTANYL CITRATE (PF) 100 MCG/2ML IJ SOLN: 25.0000 ug | INTRAMUSCULAR | Status: DC | PRN

## 2017-05-17 MED ORDER — MEPERIDINE HCL 50 MG/ML IJ SOLN: 6.2500 mg | INTRAMUSCULAR | Status: DC | PRN

## 2017-05-17 MED ORDER — DEXAMETHASONE SODIUM PHOSPHATE 10 MG/ML IJ SOLN
INTRAMUSCULAR | Status: DC | PRN
  Administered 2017-05-17: 8 mg via INTRAVENOUS

## 2017-05-17 SURGICAL SUPPLY — 14 items
BAG URINE DRAINAGE (UROLOGICAL SUPPLIES) IMPLANT
BAG URO CATCHER STRL LF (MISCELLANEOUS) ×3 IMPLANT
CATH INTERMIT  6FR 70CM (CATHETERS) ×3 IMPLANT
COVER SURGICAL LIGHT HANDLE (MISCELLANEOUS) ×3 IMPLANT
ELECT REM PT RETURN 15FT ADLT (MISCELLANEOUS) ×3 IMPLANT
EVACUATOR MICROVAS BLADDER (UROLOGICAL SUPPLIES) IMPLANT
GLOVE BIOGEL M STRL SZ7.5 (GLOVE) ×3 IMPLANT
GOWN STRL REUS W/TWL LRG LVL3 (GOWN DISPOSABLE) ×6 IMPLANT
LOOP CUT BIPOLAR 24F LRG (ELECTROSURGICAL) IMPLANT
MANIFOLD NEPTUNE II (INSTRUMENTS) ×3 IMPLANT
PACK CYSTO (CUSTOM PROCEDURE TRAY) ×3 IMPLANT
SET ASPIRATION TUBING (TUBING) IMPLANT
SYRINGE IRR TOOMEY STRL 70CC (SYRINGE) IMPLANT
TUBING CONNECTING 10 (TUBING) ×3 IMPLANT

## 2017-05-17 NOTE — Anesthesia Preprocedure Evaluation (Addendum)
Anesthesia Evaluation  Patient identified by MRN, date of birth, ID band Patient awake    Reviewed: Allergy & Precautions, NPO status , Patient's Chart, lab work & pertinent test results  History of Anesthesia Complications Negative for: history of anesthetic complications  Airway Mallampati: II  TM Distance: >3 FB Neck ROM: Full    Dental  (+) Dental Advisory Given   Pulmonary  Sarcoidosis   breath sounds clear to auscultation       Cardiovascular negative cardio ROS   Rhythm:Regular Rate:Normal     Neuro/Psych Depression Blind (optic neuritis) neurosarcoidosis    GI/Hepatic Neg liver ROS, GERD  Medicated and Controlled,  Endo/Other  Hypothyroidism   Renal/GU negative Renal ROS     Musculoskeletal   Abdominal   Peds  Hematology negative hematology ROS (+)   Anesthesia Other Findings   Reproductive/Obstetrics                             Anesthesia Physical Anesthesia Plan  ASA: III  Anesthesia Plan: General   Post-op Pain Management:    Induction: Intravenous  Airway Management Planned: Mask and Natural Airway  Additional Equipment:   Intra-op Plan:   Post-operative Plan:   Informed Consent: I have reviewed the patients History and Physical, chart, labs and discussed the procedure including the risks, benefits and alternatives for the proposed anesthesia with the patient or authorized representative who has indicated his/her understanding and acceptance.   Dental advisory given  Plan Discussed with: CRNA and Surgeon  Anesthesia Plan Comments: (Plan routine monitors, GA)        Anesthesia Quick Evaluation

## 2017-05-17 NOTE — Discharge Instructions (Signed)
General Anesthesia, Adult, Care After These instructions provide you with information about caring for yourself after your procedure. Your health care provider may also give you more specific instructions. Your treatment has been planned according to current medical practices, but problems sometimes occur. Call your health care provider if you have any problems or questions after your procedure. What can I expect after the procedure? After the procedure, it is common to have:  Vomiting.  A sore throat.  Mental slowness.  It is common to feel:  Nauseous.  Cold or shivery.  Sleepy.  Tired.  Sore or achy, even in parts of your body where you did not have surgery.  Follow these instructions at home: For at least 24 hours after the procedure:  Do not: ? Participate in activities where you could fall or become injured. ? Drive. ? Use heavy machinery. ? Drink alcohol. ? Take sleeping pills or medicines that cause drowsiness. ? Make important decisions or sign legal documents. ? Take care of children on your own.  Rest. Eating and drinking  If you vomit, drink water, juice, or soup when you can drink without vomiting.  Drink enough fluid to keep your urine clear or pale yellow.  Make sure you have little or no nausea before eating solid foods.  Follow the diet recommended by your health care provider. General instructions  Have a responsible adult stay with you until you are awake and alert.  Return to your normal activities as told by your health care provider. Ask your health care provider what activities are safe for you.  Take over-the-counter and prescription medicines only as told by your health care provider.  If you smoke, do not smoke without supervision.  Keep all follow-up visits as told by your health care provider. This is important. Contact a health care provider if:  You continue to have nausea or vomiting at home, and medicines are not helpful.  You  cannot drink fluids or start eating again.  You cannot urinate after 8-12 hours.  You develop a skin rash.  You have fever.  You have increasing redness at the site of your procedure. Get help right away if:  You have difficulty breathing.  You have chest pain.  You have unexpected bleeding.  You feel that you are having a life-threatening or urgent problem. This information is not intended to replace advice given to you by your health care provider. Make sure you discuss any questions you have with your health care provider. Document Released: 03/11/2001 Document Revised: 05/07/2016 Document Reviewed: 11/17/2015 Elsevier Interactive Patient Education  2018 Jamie [name] may have urinary urgency (bladder spasms) and bloody urine on / off x few days. This is normal.  2 - Call MD or go to ER for fever >102, severe pain / nausea / vomiting not relieved by medications, or acute change in medical status

## 2017-05-17 NOTE — Brief Op Note (Signed)
05/17/2017  10:38 AM  PATIENT:  Jamie [name]  61 y.o. female  PRE-OPERATIVE DIAGNOSIS:  BLADDER MASS  POST-OPERATIVE DIAGNOSIS:  BLADDER MASS  PROCEDURE:  Procedure(s): CYSTOSCOPY WITH BIOPSY (N/A) CYSTOSCOPY BILATERAL RETROGRADE PYLOGRAM, BLADDER BIOPSY WITH FULGERATION (N/A)  SURGEON:  Surgeon(s) and Role:    Alexis Frock, MD - Primary  PHYSICIAN ASSISTANT:   ASSISTANTS: none   ANESTHESIA:   MAC  EBL:  No intake/output data recorded.  BLOOD ADMINISTERED:none  DRAINS: none   LOCAL MEDICATIONS USED:  NONE  SPECIMEN:  Source of Specimen:  Bladder erythema  DISPOSITION OF SPECIMEN:  PATHOLOGY  COUNTS:  YES  TOURNIQUET:  * No tourniquets in log *  DICTATION: .Other Dictation: Dictation Number [id number]  PLAN OF CARE: Discharge to home after PACU  PATIENT DISPOSITION:  PACU - hemodynamically stable.   Delay start of Pharmacological VTE agent (>24hrs) due to surgical blood loss or risk of bleeding: yes

## 2017-05-17 NOTE — Anesthesia Postprocedure Evaluation (Signed)
Anesthesia Post Note  Patient: Jamie [name]  Procedure(s) Performed: Procedure(s) (LRB): CYSTOSCOPY WITH BIOPSY (N/A) CYSTOSCOPY BILATERAL RETROGRADE PYLOGRAM, BLADDER BIOPSY WITH FULGERATION (N/A)     Patient location during evaluation: PACU Anesthesia Type: General Level of consciousness: awake and alert, oriented and patient cooperative Pain management: pain level controlled Vital Signs Assessment: post-procedure vital signs reviewed and stable Respiratory status: spontaneous breathing, nonlabored ventilation and respiratory function stable Cardiovascular status: blood pressure returned to baseline and stable Postop Assessment: no signs of nausea or vomiting Anesthetic complications: no    Last Vitals:  Vitals:   05/17/17 1143 05/17/17 1207  BP: 130/69 (!) 125/58  Pulse: 74 83  Resp: 16 16  Temp: 36.3 C     Last Pain:  Vitals:   05/17/17 0803  TempSrc: Oral                 Maral Lampe,E. Dorthia Tout

## 2017-05-17 NOTE — Interval H&P Note (Signed)
History and Physical Interval Note:  05/17/2017 9:32 AM  Jamie [name]  has presented today for surgery, with the diagnosis of BLADDER MASS  The various methods of treatment have been discussed with the patient and family. After consideration of risks, benefits and other options for treatment, the patient has consented to  Procedure(s): CYSTOSCOPY WITH BIOPSY (N/A) CYSTOSCOPY WITH FULGERATION (N/A) as a surgical intervention .  The patient's history has been reviewed, patient examined, no change in status, stable for surgery.  I have reviewed the patient's chart and labs.  Questions were answered to the patient's satisfaction.     Marabelle Cushman

## 2017-05-17 NOTE — H&P (Signed)
Jamie [name] is an 61 y.o. female.    Chief Complaint: Pre-op Bladder Biopsy / Fulgeration  HPI:   1 - Rule Out Bladder Cancer - pt with diffuse bladder thickening and ? bullous edema by cysto and CT 02/2017 on evaluation hematuria and refracotry incontinence. CT w/o upper tract lesions, but impressive bladder enhancement.   2 - Spastic Neurogenic Bladder - follows with Dr. 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 Failed anticholinergics, myrbetriq. Now on PTNS. Does not appear to have had BTX trial.   3 - Recurrent CYstitis - on trimethoprim supression by Dr. Matilde Sprang for occasional cystitis. Must recnet CX's skin contaminnant, but she has had ESBL e. coli in past.   PMH sig for neurosarcoid (blind, slow motor movements), NO CV dissease / blood thinners. NO chest / abd surgery.. Her PCP is Pricilla Holm MD.   Today "Jamie [name]" is seen to proceed with bladder biopsy to rule out bladder neoplasm. No interval fevers. Most recetn UCX sking contaminant.   Past Medical History:  Diagnosis Date  . Depression   . Hypothyroidism   . Neurosarcoidosis in adult    follow[facility] at [facility]  . 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 Progressive gait disorder    Was treated for probable MS until diagnosed with Neurosarcoidosis about 10 years ago.   . Sacral fracture ([place])    2016 no surgery / incontinent wears brief  and numbness in pelvi area  . Thyroid disease     Past Surgical History:  Procedure Laterality Date  . TONSILLECTOMY      Family History  Problem Relation Age of Onset  . Hypertension Father    Social History:  reports that she has never smoked. She has never used smokeless tobacco. She reports that she does not drink alcohol or use drugs.  Allergies:  Allergies  Allergen Reactions  . Remicade [Infliximab]     shingles  . Penicillins Rash    No prescriptions prior to admission.    Results for  orders placed or performed during the hospital encounter of 05/15/17 (from the past 48 hour(s))  Basic metabolic panel     Status: None   Collection Time: 05/15/17 12:55 PM  Result Value Ref Range   Sodium 143 135 - 145 mmol/L   Potassium 3.9 3.5 - 5.1 mmol/L   Chloride 110 101 - 111 mmol/L   CO2 25 22 - 32 mmol/L   Glucose, Bld 89 65 - 99 mg/dL   BUN 18 6 - 20 mg/dL   Creatinine, Ser 0.59 0.44 - 1.00 mg/dL   Calcium 9.2 8.9 - 10.3 mg/dL   GFR calc non Af Amer >60 >60 mL/min   GFR calc Af Amer >60 >60 mL/min    Comment: (NOTE) The eGFR has been calculated using the CKD EPI equation. This calculation has not been validated in all clinical situations. eGFR's persistently <60 mL/min signify possible Chronic Kidney Disease.    Anion gap 8 5 - 15  CBC     Status: Abnormal   Collection Time: 05/15/17 12:55 PM  Result Value Ref Range   WBC 7.9 4.0 - 10.5 K/uL   RBC 4.24 3.87 - 5.11 MIL/uL   Hemoglobin 11.7 (L) 12.0 - 15.0 g/dL   HCT 33.5 (L) 36.0 - 46.0 %   MCV 79.0 78.0 - 100.0 fL   MCH 27.6 26.0 - 34.0 pg   MCHC 34.9 30.0 - 36.0 g/dL  RDW 16.5 (H) 11.5 - 15.5 %   Platelets 363 150 - 400 K/uL   No results found.  Review of Systems  Constitutional: Negative.  Negative for chills and fever.  HENT: Negative.   Eyes: Positive for blurred vision.  Respiratory: Negative.   Cardiovascular: Negative.   Gastrointestinal: Negative.   Genitourinary: Positive for frequency and urgency. Negative for flank pain and hematuria.  Musculoskeletal: Negative.   Skin: Negative.   Neurological: Negative.   Endo/Heme/Allergies: Negative.   Psychiatric/Behavioral: Negative.     There were no vitals taken for this visit. Physical Exam  Constitutional: She appears well-developed.  HENT:  Head: Normocephalic.  Eyes: Pupils are equal, round, and reactive to light.  Neck: Normal range of motion.  Cardiovascular:  Nl rate  Respiratory: Effort normal.  GI: Soft.  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   Neurological: She is alert.  Skin: Skin is warm.  Psychiatric: She has a normal mood and affect.     Assessment/Plan  Proceed as planned with bladder biopsy / fulgeration. Risks, benefits, alternatives, expected peri-op course discussed previously and reiterated today.   Alexis Frock, MD 05/17/2017, 6:12 AM

## 2017-05-17 NOTE — Transfer of Care (Signed)
Immediate Anesthesia Transfer of Care Note  Patient: Jamie [name]  Procedure(s) Performed: Procedure(s): CYSTOSCOPY WITH BIOPSY (N/A) CYSTOSCOPY BILATERAL RETROGRADE PYLOGRAM, BLADDER BIOPSY WITH FULGERATION (N/A)  Patient Location: PACU  Anesthesia Type:General  Level of Consciousness: awake, alert  and oriented  Airway & Oxygen Therapy: Patient Spontanous Breathing and Patient connected to face mask oxygen  Post-op Assessment: Report given to RN and Post -op Vital signs reviewed and stable  Post vital signs: Reviewed and stable  Last Vitals:  Vitals:   05/17/17 0803  BP: (!) 91/56  Pulse: 86  Resp: 18  Temp: 36.7 C    Last Pain:  Vitals:   05/17/17 0803  TempSrc: Oral      Patients Stated Pain Goal: 4 (70/26/37 8588)  Complications: No apparent anesthesia complications

## 2017-05-20 ENCOUNTER — Encounter (HOSPITAL_COMMUNITY): Payer: Self-pay | Admitting: Urology

## 2017-05-20 NOTE — Op Note (Signed)
NAME:  Jamie [name], Jamie [name] NO.:  [id number]  MEDICAL RECORD NO.:  [id number]  LOCATION:                                 FACILITY:  PHYSICIAN:  Alexis Frock, MD          DATE OF BIRTH:  DATE OF PROCEDURE:  05/17/2017                              OPERATIVE REPORT   PREOPERATIVE DIAGNOSES: 1. Bladder erythema. 2. History of urinary tract infections. 3. Neurogenic bladder and hematuria. 4. Procedure cystoscopy with bilateral pyelogram interpretation. 5. Bladder biopsy fulguration.  ESTIMATED BLOOD LOSS:  Nil.  COMPLICATION:  None.  SPECIMEN:  Bladder erythema, history of recurrent infections for permanent pathology.  FINDINGS: 1. Unremarkable bilateral retrograde pyelograms. 2. Lateral splaying of ureteral orifices. 3. Erythema and bullous edema of the posterior bladder wall, felt     likely mostly to be consistent with irritation.  Biopsy obtained.  INDICATION:  Jamie [name] is an unfortunate 61 year old lady with history of neurosarcoid.  She has a neurogenic bladder and near complete blindness from this.  Her neurogenic bladder is spastic type.  She is on workup of gross hematuria to have a very impressive bladder enhancement and mucosal edema and erythema by cystoscopy.  This was somewhat concerning for possible bladder neoplasm.  Options were discussed for management including operative biopsy to rule out carcinoma and she wished to proceed.  Informed consent was obtained and placed in the medical record.  PROCEDURE IN DETAIL:  The patient being, Jamie [name], was verified. Procedure being cysto with retrograde, bladder biopsy, and fulguration was confirmed.  Procedure was carried out.  Time-out was performed. Intravenous antibiotics were administered.  Monitored anesthesia care was delivered.  The patient was placed into a medium lithotomy position. Sterile field was created by prepping and draping the patient's vagina, introitus, and proximal thighs  using non-iodinated prep. Cystourethroscopy was then performed using a rigid cystoscope with offset lens.  The urine was quite proteinaceous. This was irrigated multiple times to allow better visualization.  There was significant area of erythema and mucosal edema mostly on the posterior wall, just above the area of trigone consistent most likely with infectious and inflammatory changes grossly, but a carcinoma of course cannot be ruled out with visual inspection alone.  Ureteral orifices were somewhat laterally displaced.  The left ureteral orifice was cannulated with a 6- French end-hole catheter and left retrograde pyelogram was obtained.  Left retrograde pyelogram demonstrated a single left ureter with single- system left kidney.  No filling defects or narrowing noted.  Similarly, right retrograde pyelogram was obtained.  Right retrograde pyelogram demonstrated a single right ureter with single-system right kidney.  There was mild caliectasis without obvious filling defects or narrowing.  Cold cup biopsy forceps were used to obtain representative seromuscular bites of the area of maximal edema and erythema from the posterior wall taking exquisite care to avoid bladder perforation, which did not occur. These were set aside, labeled as bladder erythema.  History of recurrent infections for permanent pathology.  The base of these areas was fulgurated with a Bovie electrode, which resulted in excellent hemostasis.  The bladder was once again inspected.  There was no  evidence of perforation.  There was good hemostasis.  We achieved the goals of procedure today.  Bladder was emptied per cystoscope. Procedure was then terminated.  The patient tolerated the procedure well.  There were no immediate periprocedural complications.  The patient was taken to the postanesthesia care in stable condition.    ______________________________ Alexis Frock,  MD   ______________________________ Alexis Frock, MD    TM/MEDQ  D:  05/17/2017  T:  05/17/2017  Job:  [id number]

## 2017-05-21 DIAGNOSIS — M5416 Radiculopathy, lumbar region: Secondary | ICD-10-CM | POA: Diagnosis not present

## 2017-05-21 DIAGNOSIS — D8689 Sarcoidosis of other sites: Secondary | ICD-10-CM | POA: Diagnosis not present

## 2017-05-22 DIAGNOSIS — D8689 Sarcoidosis of other sites: Secondary | ICD-10-CM | POA: Diagnosis not present

## 2017-05-22 DIAGNOSIS — M5416 Radiculopathy, lumbar region: Secondary | ICD-10-CM | POA: Diagnosis not present

## 2017-05-24 DIAGNOSIS — M5416 Radiculopathy, lumbar region: Secondary | ICD-10-CM | POA: Diagnosis not present

## 2017-05-24 DIAGNOSIS — D8689 Sarcoidosis of other sites: Secondary | ICD-10-CM | POA: Diagnosis not present

## 2017-05-27 DIAGNOSIS — D8689 Sarcoidosis of other sites: Secondary | ICD-10-CM | POA: Diagnosis not present

## 2017-05-27 DIAGNOSIS — M5416 Radiculopathy, lumbar region: Secondary | ICD-10-CM | POA: Diagnosis not present

## 2017-05-28 DIAGNOSIS — M5416 Radiculopathy, lumbar region: Secondary | ICD-10-CM | POA: Diagnosis not present

## 2017-05-28 DIAGNOSIS — D8689 Sarcoidosis of other sites: Secondary | ICD-10-CM | POA: Diagnosis not present

## 2017-05-29 ENCOUNTER — Telehealth: Payer: Self-pay | Admitting: Internal Medicine

## 2017-05-29 ENCOUNTER — Encounter: Payer: Self-pay | Admitting: Internal Medicine

## 2017-05-29 ENCOUNTER — Ambulatory Visit
Admission: RE | Admit: 2017-05-29 | Discharge: 2017-05-29 | Disposition: A | Discharge status: Home / Self Care | Payer: Medicare Other | Source: Ambulatory Visit | Attending: Internal Medicine | Admitting: Internal Medicine

## 2017-05-29 DIAGNOSIS — M5126 Other intervertebral disc displacement, lumbar region: Secondary | ICD-10-CM | POA: Diagnosis not present

## 2017-05-29 DIAGNOSIS — M5416 Radiculopathy, lumbar region: Secondary | ICD-10-CM | POA: Diagnosis not present

## 2017-05-29 DIAGNOSIS — D8689 Sarcoidosis of other sites: Secondary | ICD-10-CM

## 2017-05-29 DIAGNOSIS — G6289 Other specified polyneuropathies: Secondary | ICD-10-CM

## 2017-05-29 NOTE — Telephone Encounter (Signed)
Called back and states it has to be order through [place]

## 2017-05-29 NOTE — Telephone Encounter (Signed)
Called PT ([name]) back no answer LMOM RTC...Jamie [name]

## 2017-05-29 NOTE — Telephone Encounter (Signed)
Ok to order 

## 2017-05-29 NOTE — Telephone Encounter (Signed)
PT called and wanted to know if patient could get a rx for shower set with hand rails. Please advise.  [place]

## 2017-05-30 NOTE — Addendum Note (Signed)
Addended by: Earnstine Regal on: 05/30/2017 11:46 AM   Modules accepted: Orders

## 2017-05-30 NOTE — Telephone Encounter (Signed)
Order has been placed, and msg sent to [place] Client [facility]...Johny Chess

## 2017-06-04 DIAGNOSIS — N3946 Mixed incontinence: Secondary | ICD-10-CM | POA: Diagnosis not present

## 2017-06-04 DIAGNOSIS — N3020 Other chronic cystitis without hematuria: Secondary | ICD-10-CM | POA: Diagnosis not present

## 2017-06-05 DIAGNOSIS — D8689 Sarcoidosis of other sites: Secondary | ICD-10-CM | POA: Diagnosis not present

## 2017-06-05 DIAGNOSIS — M5416 Radiculopathy, lumbar region: Secondary | ICD-10-CM | POA: Diagnosis not present

## 2017-06-06 DIAGNOSIS — M5416 Radiculopathy, lumbar region: Secondary | ICD-10-CM | POA: Diagnosis not present

## 2017-06-06 DIAGNOSIS — D8689 Sarcoidosis of other sites: Secondary | ICD-10-CM | POA: Diagnosis not present

## 2017-06-07 DIAGNOSIS — M5416 Radiculopathy, lumbar region: Secondary | ICD-10-CM | POA: Diagnosis not present

## 2017-06-07 DIAGNOSIS — D8689 Sarcoidosis of other sites: Secondary | ICD-10-CM | POA: Diagnosis not present

## 2017-06-10 DIAGNOSIS — D8689 Sarcoidosis of other sites: Secondary | ICD-10-CM | POA: Diagnosis not present

## 2017-06-10 DIAGNOSIS — M5416 Radiculopathy, lumbar region: Secondary | ICD-10-CM | POA: Diagnosis not present

## 2017-06-12 ENCOUNTER — Encounter: Payer: Self-pay | Admitting: Nurse Practitioner

## 2017-06-12 ENCOUNTER — Ambulatory Visit (INDEPENDENT_AMBULATORY_CARE_PROVIDER_SITE_OTHER): Payer: Medicare Other | Admitting: Nurse Practitioner

## 2017-06-12 ENCOUNTER — Other Ambulatory Visit (INDEPENDENT_AMBULATORY_CARE_PROVIDER_SITE_OTHER): Payer: Medicare Other

## 2017-06-12 ENCOUNTER — Ambulatory Visit: Payer: Medicare Other | Admitting: Internal Medicine

## 2017-06-12 VITALS — BP 116/70 | HR 77 | Temp 97.8°F | Ht 64.0 in | Wt 116.0 lb

## 2017-06-12 DIAGNOSIS — R413 Other amnesia: Secondary | ICD-10-CM | POA: Diagnosis not present

## 2017-06-12 DIAGNOSIS — N390 Urinary tract infection, site not specified: Secondary | ICD-10-CM

## 2017-06-12 DIAGNOSIS — D8689 Sarcoidosis of other sites: Secondary | ICD-10-CM | POA: Diagnosis not present

## 2017-06-12 DIAGNOSIS — E039 Hypothyroidism, unspecified: Secondary | ICD-10-CM | POA: Diagnosis not present

## 2017-06-12 DIAGNOSIS — M5416 Radiculopathy, lumbar region: Secondary | ICD-10-CM | POA: Diagnosis not present

## 2017-06-12 DIAGNOSIS — G6289 Other specified polyneuropathies: Secondary | ICD-10-CM | POA: Diagnosis not present

## 2017-06-12 DIAGNOSIS — B351 Tinea unguium: Secondary | ICD-10-CM

## 2017-06-12 DIAGNOSIS — N312 Flaccid neuropathic bladder, not elsewhere classified: Secondary | ICD-10-CM | POA: Diagnosis not present

## 2017-06-12 LAB — T4, FREE: FREE T4: 1.01 ng/dL (ref 0.60–1.60)

## 2017-06-12 LAB — TSH: TSH: 0.56 u[IU]/mL (ref 0.35–4.50)

## 2017-06-12 MED ORDER — TRIMETHOPRIM 100 MG PO TABS: 100.0000 mg | ORAL_TABLET | Freq: Every day | ORAL | 3 refills | Status: DC

## 2017-06-12 MED ORDER — GABAPENTIN 300 MG PO CAPS: 300.0000 mg | ORAL_CAPSULE | Freq: Three times a day (TID) | ORAL | 1 refills | Status: AC

## 2017-06-12 NOTE — Patient Instructions (Addendum)
Go to basement for thyroid function test.  You will be contacted to schedule appt with podiatry.

## 2017-06-12 NOTE — Progress Notes (Addendum)
Subjective:  Patient ID: Jamie [name], female    DOB: February 05, 1956  Age: 61 y.o. MRN: [id number]  CC: Follow-up (6 mo fu/MRI result?/medications consult/allergic reaction to medications they did surgery (put her to sleep) consult)   HPI In office with father and caregiver.  Memory Impairment: Per caregiver: she has noticed worsening short term memory in last few months. MMSE - Mini Mental State Exam 06/12/2017  Not completed: Unable to complete  unable to complete screening due to visual impairment.  Urinary Incontinence and odor: Use of antispasmodic (vesicare and myrbetriq with no improvement). Hx of recurrent UTI due to E.coli. No improvement with biofeedback. Also incontinence of bowel.  Neuropathy: Chronic, no improvement with current dose of gabapentin. Does not want lyrica of cymbalta at this time. Normal MRI. Not interested in returning to neurology.   Outpatient Medications Prior to Visit  Medication Sig Dispense Refill  . acetaminophen (TYLENOL) 325 MG tablet Take 1-2 tablets (325-650 mg total) by mouth every 4 (four) hours as needed for mild pain.    . Ascorbic Acid (VITAMIN C) 100 MG tablet Take 100 mg by mouth daily.    Marland Kitchen FLUoxetine (PROZAC) 20 MG tablet Take 1 tablet (20 mg total) by mouth daily. 90 tablet 3  . levothyroxine (SYNTHROID, LEVOTHROID) 100 MCG tablet TAKE ONE TABLET BY MOUTH ONCE DAILY 90 tablet 3  . Multiple Vitamin (MULTIVITAMIN WITH MINERALS) TABS tablet Take 1 tablet by mouth daily.    . pantoprazole (PROTONIX) 40 MG tablet Take 1 tablet (40 mg total) by mouth daily. 30 tablet 3  . thiamine (VITAMIN B-1) 100 MG tablet Take 100 mg by mouth daily.    . traMADol (ULTRAM) 50 MG tablet Take 1 tablet (50 mg total) by mouth every 6 (six) hours as needed for moderate pain. Post-operatively. 15 tablet 0  . carbidopa-levodopa (SINEMET IR) 25-100 MG tablet Take 1 tablet by mouth 2 (two) times daily. Reported on 06/25/2016    . gabapentin (NEURONTIN) 300 MG  capsule Take 1 capsule (300 mg total) by mouth 2 (two) times daily. 180 capsule 1   No facility-administered medications prior to visit.     ROS See HPI  Objective:  BP 116/70   Pulse 77   Temp 97.8 F (36.6 C)   Ht 5\' 4"  (1.626 m)   Wt 116 lb (52.6 kg)   SpO2 97%   BMI 19.91 kg/m   BP Readings from Last 3 Encounters:  06/12/17 116/70  05/17/17 (!) 125/58  05/15/17 (!) 99/58    Wt Readings from Last 3 Encounters:  06/12/17 116 lb (52.6 kg)  05/17/17 116 lb (52.6 kg)  05/15/17 116 lb (52.6 kg)    Physical Exam  Constitutional: She is oriented to person, place, and time. No distress.  Neck: Normal range of motion. Neck supple.  Cardiovascular: Normal rate, regular rhythm and normal heart sounds.   Pulmonary/Chest: Effort normal and breath sounds normal.  Abdominal: Soft. Bowel sounds are normal. She exhibits no distension.  Musculoskeletal: She exhibits no edema or tenderness.  Neurological: She is alert and oriented to person, place, and time.  Skin: Skin is warm and dry. No erythema.  Vitals reviewed.   Lab Results  Component Value Date   WBC 7.9 05/15/2017   HGB 11.7 (L) 05/15/2017   HCT 33.5 (L) 05/15/2017   PLT 363 05/15/2017   GLUCOSE 89 05/15/2017   CHOL 211 (H) 12/12/2016   TRIG 149.0 12/12/2016   HDL 60.90 12/12/2016   LDLCALC 121 (  H) 12/12/2016   ALT 7 12/12/2016   AST 15 12/12/2016   NA 143 05/15/2017   K 3.9 05/15/2017   CL 110 05/15/2017   CREATININE 0.59 05/15/2017   BUN 18 05/15/2017   CO2 25 05/15/2017   TSH 0.56 06/12/2017    Mr Lumbar Spine Wo Contrast  Result Date: 05/29/2017 CLINICAL DATA:  Right lumbar radiculopathy, peripheral neuropathy, bladder and bowel incontinence EXAM: MRI LUMBAR SPINE WITHOUT CONTRAST TECHNIQUE: Multiplanar, multisequence MR imaging of the lumbar spine was performed. No intravenous contrast was administered. COMPARISON:  None. FINDINGS: Segmentation:  Standard. Alignment:  Physiologic. Vertebrae: Old  fracture deformity of the S2 sacral vertebral body. No acute fracture, evidence of discitis, or bone lesion. Conus medullaris: Extends to the T12 level and appears normal. Paraspinal and other soft tissues: No paraspinal abnormality. Incidental note made of cholelithiasis. Disc levels: Disc spaces: Degenerative disc disease with disc height loss at L4-5. T12-L1: No significant disc bulge. No evidence of neural foraminal stenosis. No central canal stenosis. L1-L2: No significant disc bulge. No evidence of neural foraminal stenosis. No central canal stenosis. L2-L3: No significant disc bulge. No evidence of neural foraminal stenosis. No central canal stenosis. L3-L4: No significant disc bulge. No evidence of neural foraminal stenosis. No central canal stenosis. L4-L5: Mild broad-based disc bulge. Mild bilateral facet arthropathy. No evidence of neural foraminal stenosis. No central canal stenosis. L5-S1: No significant disc bulge. No evidence of neural foraminal stenosis. No central canal stenosis. IMPRESSION: 1. At L4-5 there is a mild broad-based disc bulge with mild bilateral facet arthropathy. 2. Cholelithiasis. Electronically Signed   By: Kathreen Devoid   On: 05/29/2017 14:14    Assessment & Plan:   Jamie [name] was seen today for follow-up.  Diagnoses and all orders for this visit:  Other polyneuropathy -     gabapentin (NEURONTIN) 300 MG capsule; Take 1 capsule (300 mg total) by mouth 3 (three) times daily.  Neurogenic bladder, flaccid -     trimethoprim (TRIMPEX) 100 MG tablet; Take 1 tablet (100 mg total) by mouth daily.  Recurrent UTI -     trimethoprim (TRIMPEX) 100 MG tablet; Take 1 tablet (100 mg total) by mouth daily.  Onychomycosis -     Ambulatory referral to Podiatry  Memory loss, short term  Hypothyroidism, unspecified type -     TSH; Future -     T4, free; Future   I have changed Ms. Bolser's gabapentin and carbidopa-levodopa. I am also having her start on trimethoprim.  Additionally, I am having her maintain her acetaminophen, multivitamin with minerals, thiamine, vitamin C, levothyroxine, pantoprazole, FLUoxetine, and traMADol.  Meds ordered this encounter  Medications  . trimethoprim (TRIMPEX) 100 MG tablet    Sig: Take 1 tablet (100 mg total) by mouth daily.    Dispense:  30 tablet    Refill:  3    Order Specific Question:   Supervising Provider    Answer:   Cassandria Anger [[id number]]  . gabapentin (NEURONTIN) 300 MG capsule    Sig: Take 1 capsule (300 mg total) by mouth 3 (three) times daily.    Dispense:  270 capsule    Refill:  1    Order Specific Question:   Supervising Provider    Answer:   Cassandria Anger [[id number]]  . carbidopa-levodopa (SINEMET IR) 25-100 MG tablet    Sig: Take 1 tablet by mouth 3 (three) times daily. Reported on 06/25/2016    Dispense:  90 tablet    Refill:  2    Order Specific Question:   Supervising Provider    Answer:   Cassandria Anger [[id number]]    Follow-up: Return in about 3 months (around 09/12/2017) for with Dr. Sharlet Salina.  Wilfred Lacy, NP

## 2017-06-24 ENCOUNTER — Ambulatory Visit: Payer: Medicare Other | Admitting: Physical Medicine & Rehabilitation

## 2017-06-25 DIAGNOSIS — D8689 Sarcoidosis of other sites: Secondary | ICD-10-CM | POA: Diagnosis not present

## 2017-06-25 DIAGNOSIS — H548 Legal blindness, as defined in USA: Secondary | ICD-10-CM | POA: Diagnosis not present

## 2017-06-25 DIAGNOSIS — F329 Major depressive disorder, single episode, unspecified: Secondary | ICD-10-CM | POA: Diagnosis not present

## 2017-06-25 DIAGNOSIS — G6289 Other specified polyneuropathies: Secondary | ICD-10-CM | POA: Diagnosis not present

## 2017-06-25 DIAGNOSIS — M5416 Radiculopathy, lumbar region: Secondary | ICD-10-CM | POA: Diagnosis not present

## 2017-06-25 DIAGNOSIS — H469 Unspecified optic neuritis: Secondary | ICD-10-CM | POA: Diagnosis not present

## 2017-07-08 ENCOUNTER — Encounter: Payer: Medicare Other | Attending: Physical Medicine & Rehabilitation

## 2017-07-08 ENCOUNTER — Encounter: Payer: Self-pay | Admitting: Physical Medicine & Rehabilitation

## 2017-07-08 ENCOUNTER — Ambulatory Visit (HOSPITAL_BASED_OUTPATIENT_CLINIC_OR_DEPARTMENT_OTHER): Payer: Medicare Other | Admitting: Physical Medicine & Rehabilitation

## 2017-07-08 VITALS — BP 100/65 | HR 65

## 2017-07-08 DIAGNOSIS — N312 Flaccid neuropathic bladder, not elsewhere classified: Secondary | ICD-10-CM | POA: Diagnosis not present

## 2017-07-08 DIAGNOSIS — D8689 Sarcoidosis of other sites: Secondary | ICD-10-CM | POA: Diagnosis not present

## 2017-07-08 DIAGNOSIS — N39498 Other specified urinary incontinence: Secondary | ICD-10-CM | POA: Diagnosis not present

## 2017-07-08 DIAGNOSIS — G541 Lumbosacral plexus disorders: Secondary | ICD-10-CM | POA: Diagnosis not present

## 2017-07-08 DIAGNOSIS — E039 Hypothyroidism, unspecified: Secondary | ICD-10-CM | POA: Diagnosis not present

## 2017-07-08 DIAGNOSIS — N319 Neuromuscular dysfunction of bladder, unspecified: Secondary | ICD-10-CM | POA: Diagnosis not present

## 2017-07-08 DIAGNOSIS — R269 Unspecified abnormalities of gait and mobility: Secondary | ICD-10-CM | POA: Insufficient documentation

## 2017-07-08 DIAGNOSIS — S3422XS Injury of nerve root of sacral spine, sequela: Secondary | ICD-10-CM | POA: Diagnosis not present

## 2017-07-08 DIAGNOSIS — Z8249 Family history of ischemic heart disease and other diseases of the circulatory system: Secondary | ICD-10-CM | POA: Diagnosis not present

## 2017-07-08 DIAGNOSIS — G629 Polyneuropathy, unspecified: Secondary | ICD-10-CM | POA: Insufficient documentation

## 2017-07-08 DIAGNOSIS — Z8781 Personal history of (healed) traumatic fracture: Secondary | ICD-10-CM | POA: Insufficient documentation

## 2017-07-08 DIAGNOSIS — K592 Neurogenic bowel, not elsewhere classified: Secondary | ICD-10-CM

## 2017-07-08 DIAGNOSIS — M791 Myalgia: Secondary | ICD-10-CM | POA: Diagnosis present

## 2017-07-08 DIAGNOSIS — F329 Major depressive disorder, single episode, unspecified: Secondary | ICD-10-CM | POA: Diagnosis not present

## 2017-07-08 DIAGNOSIS — Z87440 Personal history of urinary (tract) infections: Secondary | ICD-10-CM | POA: Diagnosis not present

## 2017-07-08 NOTE — Progress Notes (Signed)
Subjective:    Patient ID: Jamie [name], female    DOB: Jul 17, 1956, 61 y.o.   MRN: [id number]  HPI Stabbing pain in buttocks and both thighs No recent falls Has some ringing in her ear- takes herbal supplement Feels like vision is getting worse has seen her opthalmologist  Hx of sacral fracture Sacral nerve injury After a fall 05/09/2015. Patient was evaluated by neurosurgery and orthopedic surgery, no operative fixation was required.  Patient has had issues with neurogenic bladder since that time with chronic incontinence. When she was hospitalized, she had problems mainly with urinary retention. Has had multiple UTIs, follows up with neuro urology, Dr. 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  Patient has a caregiver who assists her with dressing and bathing, patient is living in a retirement/[facility] community. He states that there have been complaints about her smelling like urine. She uses briefs.  Fortunately, her bowel habits are regular in the morning when she gets toileted. She has poor perirectal/perianal sensation  Pain Inventory Average Pain 7 Pain Right Now 5 My pain is intermittent, sharp and stabbing  In the last 24 hours, has pain interfered with the following? General activity 0 Relation with others 0 Enjoyment of life 10 What TIME of day is your pain at its worst? morning Sleep (in general) Good  Pain is worse with: other Pain improves with: other Relief from Meds: 0  Mobility walk with assistance use a walker how many minutes can you walk? 45 ability to climb steps?  no do you drive?  no  Function not employed: date last employed . I need assistance with the following:  meal prep, household duties and shopping  Neuro/Psych bladder control problems bowel control problems weakness numbness trouble walking confusion depression  Prior Studies Any changes since last  visit?  no  Physicians involved in your care Any changes since last visit?  no   Family History  Problem Relation Age of Onset  . Hypertension Father    Social History   Social History  . Marital status: Single    Spouse name: N/A  . Number of children: N/A  . Years of education: N/A   Social History Main Topics  . Smoking status: Never Smoker  . Smokeless tobacco: Never Used  . Alcohol use No  . Drug use: No  . Sexual activity: Not Currently   Other Topics Concern  . None   Social History Narrative  . None   Past Surgical History:  Procedure Laterality Date  . CYSTOSCOPY WITH BIOPSY N/A 05/17/2017   Procedure: CYSTOSCOPY WITH BIOPSY;  Surgeon: Alexis Frock, MD;  Location: [facility];  Service: Urology;  Laterality: N/A;  . CYSTOSCOPY WITH FULGERATION N/A 05/17/2017   Procedure: CYSTOSCOPY BILATERAL RETROGRADE PYLOGRAM, BLADDER BIOPSY WITH FULGERATION;  Surgeon: Alexis Frock, MD;  Location: [facility];  Service: Urology;  Laterality: N/A;  . TONSILLECTOMY     Past Medical History:  Diagnosis Date  . Depression   . Hypothyroidism   . Neurosarcoidosis in adult    followed at [facility]  . 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 Progressive gait disorder    Was treated for probable MS until diagnosed with Neurosarcoidosis about 10 years ago.   . Sacral fracture ([place])    2016 no surgery / incontinent wears brief  and numbness in pelvi area  . Thyroid disease  BP 100/65   Pulse 65   SpO2 97%   Opioid Risk Score:   Fall Risk Score:  `1  Depression screen PHQ 2/9  Depression screen [facility] 2/9 07/08/2017 07/12/2015 07/12/2015  Decreased Interest 0 0 0  Down, Depressed, Hopeless 0 0 0  PHQ - 2 Score 0 0 0  Altered sleeping - 0 -  Tired, decreased energy - 1 -  Change in appetite - 0 -  Feeling bad or failure about yourself  - 0 -  Trouble concentrating - 0 -  Moving slowly or fidgety/restless - 3 -  Suicidal thoughts  - 0 -  PHQ-9 Score - 4 -   Review of Systems  Constitutional: Negative.   HENT: Negative.   Eyes: Negative.   Respiratory: Negative.   Cardiovascular: Negative.   Gastrointestinal: Positive for constipation.  Endocrine: Negative.   Genitourinary:       Bladder control  Musculoskeletal: Positive for gait problem.  Skin: Negative.   Allergic/Immunologic: Negative.   Neurological: Positive for numbness.  Hematological: Negative.   Psychiatric/Behavioral: Positive for confusion and dysphoric mood.  All other systems reviewed and are negative.      Objective:   Physical Exam  Constitutional: She appears well-developed and well-nourished.  HENT:  Head: Normocephalic and atraumatic.  Eyes: Pupils are equal, round, and reactive to light. Conjunctivae and EOM are normal.  Skin: Skin is warm and dry.  Psychiatric: She has a normal mood and affect.  Nursing note and vitals reviewed.  Motor strength is 4/5. Bilateral hip flexor, knee extensor, ankle dorsiflexors. Sensation intact to pinprick and light touch in both lower limbs. She has paresthesias in the posterior thigh area) left side.  Mood and affect without evidence of lability or agitation. She does have slowed responses.  Bilateral feet show no evidence of breakdown. No evidence of foot intrinsic atrophy.       Assessment & Plan:   1. History of sacral fracture with bilateral sacral neuropathy. Her functional status has been stable. I discussed with the patient and her father and her caregiver that it is unlikely that she will get any further spontaneous improvement in her neurologic deficits which include neurogenic bladder as well as lower extremity paresthesias and reduced balance.  2. Family notes some decline in her cognitive functioning. I suspect this may be related to her neurosarcoidosis. She will follow-up with neurology next month.  Physical medicine and rehabilitation follow-up on when necessary  basis  Patient should follow up with neuro urology, she may benefit from artificial sphincter or potentially a suprapubic catheter  Over half of the 25 min visit was spent counseling and coordinating care.

## 2017-07-08 NOTE — Patient Instructions (Signed)
May increase gabapentin to 300 mg 4 times per day.

## 2017-07-12 ENCOUNTER — Ambulatory Visit (INDEPENDENT_AMBULATORY_CARE_PROVIDER_SITE_OTHER): Payer: Medicare Other | Admitting: Podiatry

## 2017-07-12 ENCOUNTER — Encounter: Payer: Self-pay | Admitting: Podiatry

## 2017-07-12 VITALS — BP 99/60 | HR 77 | Resp 16

## 2017-07-12 DIAGNOSIS — M79674 Pain in right toe(s): Secondary | ICD-10-CM

## 2017-07-12 DIAGNOSIS — B351 Tinea unguium: Secondary | ICD-10-CM | POA: Diagnosis not present

## 2017-07-12 DIAGNOSIS — M79675 Pain in left toe(s): Secondary | ICD-10-CM | POA: Diagnosis not present

## 2017-07-12 DIAGNOSIS — G629 Polyneuropathy, unspecified: Secondary | ICD-10-CM

## 2017-07-12 DIAGNOSIS — M722 Plantar fascial fibromatosis: Secondary | ICD-10-CM

## 2017-07-12 DIAGNOSIS — M7742 Metatarsalgia, left foot: Secondary | ICD-10-CM

## 2017-07-12 NOTE — Patient Instructions (Signed)
Plantar Fasciitis Rehab Ask your health care provider which exercises are safe for you. Do exercises exactly as told by your health care provider and adjust them as directed. It is normal to feel mild stretching, pulling, tightness, or discomfort as you do these exercises, but you should stop right away if you feel sudden pain or your pain gets worse. Do not begin these exercises until told by your health care provider. Stretching and range of motion exercises These exercises warm up your muscles and joints and improve the movement and flexibility of your foot. These exercises also help to relieve pain. Exercise A: Plantar fascia stretch  1. Sit with your left / right leg crossed over your opposite knee. 2. Hold your heel with one hand with that thumb near your arch. With your other hand, hold your toes and gently pull them back toward the top of your foot. You should feel a stretch on the bottom of your toes or your foot or both. 3. Hold this stretch for__________ seconds. 4. Slowly release your toes and return to the starting position. Repeat __________ times. Complete this exercise __________ times a day. Exercise B: Gastroc, standing  1. Stand with your hands against a wall. 2. Extend your left / right leg behind you, and bend your front knee slightly. 3. Keeping your heels on the floor and keeping your back knee straight, shift your weight toward the wall without arching your back. You should feel a gentle stretch in your left / right calf. 4. Hold this position for __________ seconds. Repeat __________ times. Complete this exercise __________ times a day. Exercise C: Soleus, standing 1. Stand with your hands against a wall. 2. Extend your left / right leg behind you, and bend your front knee slightly. 3. Keeping your heels on the floor, bend your back knee and slightly shift your weight over the back leg. You should feel a gentle stretch deep in your calf. 4. Hold this position for  __________ seconds. Repeat __________ times. Complete this exercise __________ times a day. Exercise D: Gastrocsoleus, standing 1. Stand with the ball of your left / right foot on a step. The ball of your foot is on the walking surface, right under your toes. 2. Keep your other foot firmly on the same step. 3. Hold onto the wall or a railing for balance. 4. Slowly lift your other foot, allowing your body weight to press your heel down over the edge of the step. You should feel a stretch in your left / right calf. 5. Hold this position for __________ seconds. 6. Return both feet to the step. 7. Repeat this exercise with a slight bend in your left / right knee. Repeat __________ times with your left / right knee straight and __________ times with your left / right knee bent. Complete this exercise __________ times a day. Balance exercise This exercise builds your balance and strength control of your arch to help take pressure off your plantar fascia. Exercise E: Single leg stand 1. Without shoes, stand near a railing or in a doorway. You may hold onto the railing or door frame as needed. 2. Stand on your left / right foot. Keep your big toe down on the floor and try to keep your arch lifted. Do not let your foot roll inward. 3. Hold this position for __________ seconds. 4. If this exercise is too easy, you can try it with your eyes closed or while standing on a pillow. Repeat __________ times. Complete  this exercise __________ times a day. This information is not intended to replace advice given to you by your health care provider. Make sure you discuss any questions you have with your health care provider. Document Released: 12/03/2005 Document Revised: 08/07/2016 Document Reviewed: 10/17/2015 Elsevier Interactive Patient Education  2018 [employer]   Onychomycosis/Fungal Toenails  WHAT IS IT? An infection that lies within the keratin of your nail plate that is caused by a fungus.  WHY  ME? Fungal infections affect all ages, sexes, races, and creeds.  There may be many factors that predispose you to a fungal infection such as age, coexisting medical conditions such as diabetes, or an autoimmune disease; stress, medications, fatigue, genetics, etc.  Bottom line: fungus thrives in a warm, moist environment and your shoes offer such a location.  IS IT CONTAGIOUS? Theoretically, yes.  You do not want to share shoes, nail clippers or files with someone who has fungal toenails.  Walking around barefoot in the same room or sleeping in the same bed is unlikely to transfer the organism.  It is important to realize, however, that fungus can spread easily from one nail to the next on the same foot.  HOW DO WE TREAT THIS?  There are several ways to treat this condition.  Treatment may depend on many factors such as age, medications, pregnancy, liver and kidney conditions, etc.  It is best to ask your doctor which options are available to you.  1. No treatment.   Unlike many other medical concerns, you can live with this condition.  However for many people this can be a painful condition and may lead to ingrown toenails or a bacterial infection.  It is recommended that you keep the nails cut short to help reduce the amount of fungal nail. 2. Topical treatment.  These range from herbal remedies to prescription strength nail lacquers.  About 40-50% effective, topicals require twice daily application for approximately 9 to 12 months or until an entirely new nail has grown out.  The most effective topicals are medical grade medications available through physicians offices. 3. Oral antifungal medications.  With an 80-90% cure rate, the most common oral medication requires 3 to 4 months of therapy and stays in your system for a year as the new nail grows out.  Oral antifungal medications do require blood work to make sure it is a safe drug for you.  A liver function panel will be performed prior to starting  the medication and after the first month of treatment.  It is important to have the blood work performed to avoid any harmful side effects.  In general, this medication safe but blood work is required. 4. Laser Therapy.  This treatment is performed by applying a specialized laser to the affected nail plate.  This therapy is noninvasive, fast, and non-painful.  It is not covered by insurance and is therefore, out of pocket.  The results have been very good with a 80-95% cure rate.  The [facility] is the only practice in the area to offer this therapy. 5. Permanent Nail Avulsion.  Removing the entire nail so that a new nail will not grow back. 

## 2017-07-12 NOTE — Progress Notes (Signed)
   Subjective:    Patient ID: Jamie [name], female    DOB: 01-Nov-1956, 61 y.o.   MRN: [id number]  HPI 61 year old female presents the office they consented thick, discolored, elongated toenails that she cannot trim herself. She states the nails to get painful pressure in shoes. She states that she is ready over-the-counter treatment for nail fungus. She has a states that her ball of her foot and he'll also have been hurting. This has been ongoing at this point for several months. She also states that she has a "webbed" feeling in between her toes. She does have neuropathy and she just increase her dose of gabapentin within the last week. She denies any recent injury or trauma to her lower extremities she has a history of a sacral fracture 2 years ago. She is not is any swelling to her feet. No redness or warmth. She has no other concerns today.   Review of Systems  Eyes: Positive for visual disturbance.  Musculoskeletal: Positive for arthralgias and gait problem.  All other systems reviewed and are negative.      Objective:   Physical Exam General: AAO x3, NAD  Dermatological: Nails are hypertrophic, dystrophic, brittle, discolored, elongated 10. No surrounding redness or drainage. Tenderness nails 1-5 bilaterally. No open lesions or pre-ulcerative lesions are identified today.  Vascular: Dorsalis Pedis artery and Posterior Tibial artery pedal pulses are 2/4 bilateral with immedate capillary fill time. There is no pain with calf compression, swelling, warmth, erythema.   Neruologic: Sensation decreased with Derrel Nip monofilament.  Musculoskeletal:  There is mild diffuse tenderness to the plantar aspect the left foot submetatarsal 1-5. Also some minimal discomfort along the insertion of the plantar fascial the arch of the foot. There is no area pinpoint bony tenderness there is no pain vibratory sensation. Equinus is present. There is no overlying edema, erythema, increase in warmth  present bilaterally.  Presents in a wheelchair  Assessment: Symptomatic onychomycosis, tendinitis/metatarsalgia left foot with neuropathy  Plan: -Treatment options discussed including all alternatives, risks, and complications -Etiology of symptoms were discussed -Nails debrided 10 without complications or bleeding. Discussed treatment options for nail fungus and they would like treatment. I ordered a compound cream through [facility] to apply -Discussed gentle stretching, rehabilitation exercises for feet. Her shoes were also 60 years old. I discussed the change in shoe gear as well. Itching and she is does not help completely and that her symptoms I do recommend insert. -Continue gabapentin for neuropathy. -Daily foot inspection -Follow-up in 3 months or sooner if any problems arise. In the meantime, encouraged to call the office with any questions, concerns, change in symptoms.   Celesta Gentile, DPM

## 2017-08-09 ENCOUNTER — Encounter: Payer: Self-pay | Admitting: Neurology

## 2017-08-09 ENCOUNTER — Ambulatory Visit (INDEPENDENT_AMBULATORY_CARE_PROVIDER_SITE_OTHER): Payer: Medicare Other | Admitting: Neurology

## 2017-08-09 ENCOUNTER — Other Ambulatory Visit (INDEPENDENT_AMBULATORY_CARE_PROVIDER_SITE_OTHER): Payer: Medicare Other

## 2017-08-09 VITALS — BP 104/64 | HR 72 | Ht 64.0 in | Wt 116.0 lb

## 2017-08-09 DIAGNOSIS — R4189 Other symptoms and signs involving cognitive functions and awareness: Secondary | ICD-10-CM

## 2017-08-09 DIAGNOSIS — D8689 Sarcoidosis of other sites: Secondary | ICD-10-CM | POA: Diagnosis not present

## 2017-08-09 DIAGNOSIS — M792 Neuralgia and neuritis, unspecified: Secondary | ICD-10-CM | POA: Diagnosis not present

## 2017-08-09 LAB — VITAMIN B12

## 2017-08-09 NOTE — Patient Instructions (Addendum)
Check vitamin B12 You can increase gabapentin to 200mg  three times daily Encourage to see a psychiatrist to address depression Recommend doing activities that you may enjoy that would be healthy for your brain such as listening to music, light exercise, etc  Return to clinic in 9 months

## 2017-08-09 NOTE — Progress Notes (Signed)
[facility] Clinic Note - Initial Visit   Date: 08/09/17  Jamie [name] MRN: [id number] DOB: [birth year]   Dear Dr. Sharlet Salina:  Thank you for your kind referral of Jamie [name] for consultation of neurosarcoidosis. Although her history is well known to you, please allow [place] to reiterate it for the purpose of our medical record. The patient was accompanied to the clinic by father and caregiver who also provides collateral information.     History of Present Illness: Jamie [name] is a 61 y.o. 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  Dr. Mindi Junker is a disabled chiropractor.    She first began having neurological symptoms at the age of 29 with diplopia and bilateral optic neuritis, initially thought to have multiple sclerosis and treated with Copaxone for 9 months.  MRI brain imaging study from 1996 and the results suggested cerebellar atrophy with white matter changes without enhancement. She was then diagnosed, at [facility] by a transbronchial biopsy, which confirmed sarcoid in 2004. She was then treated with prednisone, then methotrexate + prednisone, then infliximab, and finally back to oral prednisone. Throughout these regimen changes the patient remained clinically stable without any improvement or worsening of symptoms.   From August 2012 - April 2016, she was followed by Dr. Nigel Bridgeman at [facility] who noted that she had complaints of gait difficulty and offered a trial of Ampyra, but this was not approved. She has had multiple sessions of PT without lasting benefit.    From August 2016 - September 2017, she was followed by Dr. Dimas Millin at [facility] who was managing sympatomically as there had been no acute flare in 10+ years.  His most recent notes  indicates a trial of sinemet due to shuffling gait, but she has not noticed much difference.  She suffered a fall in May 2016 causing sacral fracture, as well as urinary and bowel dysfunction.  She sees Dr. McDiarmid for neurogenic bladder.  Since this fall, she has stabbing pain in her buttocks, thighs, and soles of the feet and sees. Dr. Letta Pate for pain management.    She stays at an senior apartment complex and has a her caregiver for two years, for 6 hours per day for 7-days per week.  In 2013, she was living independently in [facility].  Since her fall,  she has had progressive difficulty with walking, requiring a walker at home and wheelchair for long distances.   Her father feels that since moving to [facility], she has become more dependent and "lazy".  She does not manage her own finances or medications, which her father (POA) does.  Her caregiver states that she has no interests or activities and sleeps all day. Patient endorses low mood. Her caregiver has suggested cogntiive activities, but patient states with her vision impairment, she cannot do puzzles, etc.  They are also concerned about her forgetfulness and lack of motivation to do tasks.    Out-side paper records, electronic medical record, and images have been reviewed where available and summarized as:  MRI lumbar spine wo contrast 05/29/2017: 1. At L4-5 there is a mild broad-based disc bulge with mild bilateral facet arthropathy. 2. Cholelithiasis. MRI brain wwo contrast 02/22/2015:  1. Unchanged enhancing focus in the right IAC and mild nodular thickening along the tentorium on the right.  2. Unchanged cerebellar and brainstem atrophy. 3. Unchanged  nonenhancing white matter lesions within the brain parenchyma..  Lab Results  Component Value Date   TSH 0.56 06/12/2017   Past Medical History:  Diagnosis Date  . Depression   . Hypothyroidism   . Neurosarcoidosis in adult    followed at [facility]  . Optic neuritis 2003   with  progressive blindness  . 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 Progressive gait disorder    Was treated for probable MS until diagnosed with Neurosarcoidosis about 10 years ago.   . Sacral fracture ([place])    2016 no surgery / incontinent wears brief  and numbness in pelvi area  . Thyroid disease     Past Surgical History:  Procedure Laterality Date  . CYSTOSCOPY WITH BIOPSY N/A 05/17/2017   Procedure: CYSTOSCOPY WITH BIOPSY;  Surgeon: Alexis Frock, MD;  Location: [facility];  Service: Urology;  Laterality: N/A;  . CYSTOSCOPY WITH FULGERATION N/A 05/17/2017   Procedure: CYSTOSCOPY BILATERAL RETROGRADE PYLOGRAM, BLADDER BIOPSY WITH FULGERATION;  Surgeon: Alexis Frock, MD;  Location: [facility];  Service: Urology;  Laterality: N/A;  . TONSILLECTOMY       Medications:  Outpatient Encounter Prescriptions as of 08/09/2017  Medication Sig  . acetaminophen (TYLENOL) 325 MG tablet Take 1-2 tablets (325-650 mg total) by mouth every 4 (four) hours as needed for mild pain.  . Ascorbic Acid (VITAMIN C) 100 MG tablet Take 100 mg by mouth daily.  . carbidopa-levodopa (SINEMET IR) 25-100 MG tablet Take 1 tablet by mouth 3 (three) times daily. Reported on 06/25/2016  . FLUoxetine (PROZAC) 20 MG tablet Take 1 tablet (20 mg total) by mouth daily.  Marland Kitchen gabapentin (NEURONTIN) 300 MG capsule Take 1 capsule (300 mg total) by mouth 3 (three) times daily. (Patient taking differently: Take 100 mg by mouth 3 (three) times daily. Take 100 mg in the am and at noon then 200 mg qhs.)  . levothyroxine (SYNTHROID, LEVOTHROID) 100 MCG tablet TAKE ONE TABLET BY MOUTH ONCE DAILY  . Multiple Vitamin (MULTIVITAMIN WITH MINERALS) TABS tablet Take 1 tablet by mouth daily.  . NONFORMULARY OR COMPOUNDED ITEM [facility]  Onychomycosis Nail Lacquer -  Fluconazole 2%, Terbinafine 1% DMSO Apply to affected nail once daily Qty. 120 gm 3 refills  . pantoprazole (PROTONIX) 40 MG tablet Take 1 tablet (40 mg total) by mouth daily.    Marland Kitchen thiamine (VITAMIN B-1) 100 MG tablet Take 100 mg by mouth daily.  Marland Kitchen trimethoprim (TRIMPEX) 100 MG tablet Take 1 tablet (100 mg total) by mouth daily.   No facility-administered encounter medications on file as of 08/09/2017.      Allergies:  Allergies  Allergen Reactions  . Remicade [Infliximab]     shingles  . Penicillins Rash    Family History: Family History  Problem Relation Age of Onset  . Hypertension Father     Social History: Social History  Substance Use Topics  . Smoking status: Never Smoker  . Smokeless tobacco: Never Used  . Alcohol use No   Social History   Social History Narrative   Lives alone on the second floor in a building with elevator.  Has no children.  Retired Restaurant manager, fast food.  Education: college.     Review of Systems:  CONSTITUTIONAL: No fevers, chills, night sweats, or weight loss.   EYES: +visual changes or eye pain ENT: No hearing changes.  No history of nose bleeds.   RESPIRATORY: No cough, wheezing and shortness of breath.   CARDIOVASCULAR: Negative for chest pain, and palpitations.  GI: Negative for abdominal discomfort, blood in stools or black stools.  +recent change in bowel habits.   GU:  +history of incontinence.   MUSCLOSKELETAL: +history of joint pain or swelling.  +myalgias.   SKIN: Negative for lesions, rash, and itching.   HEMATOLOGY/ONCOLOGY: Negative for prolonged bleeding, bruising easily, and swollen nodes.  No history of cancer.   ENDOCRINE: Negative for cold or heat intolerance, polydipsia or goiter.   PSYCH:  +depression or anxiety symptoms.   NEURO: As Above.   Vital Signs:  BP 104/64   Pulse 72   Ht 5\' 4"  (1.626 m)   Wt 116 lb (52.6 kg)   SpO2 98%   BMI 19.91 kg/m    General Medical Exam:   General:  Strong odor of urine, well appearing, comfortable, in wheelchair.   Eyes/ENT: see cranial nerve examination.   Neck: No masses appreciated.  Full range of motion without tenderness.  No carotid  bruits. Respiratory:  Clear to auscultation, good air entry bilaterally.   Cardiac:  Regular rate and rhythm, no murmur.   Extremities:  No deformities, edema, or skin discoloration.  Skin:  No rashes or lesions.  Neurological Exam: MENTAL STATUS including orientation to time, place, person, recent and remote memory, attention span and concentration, language, and fund of knowledge is fairly intact.  Formal cognitive testing was not performed due to visual impairment.    Speech is not dysarthric.  CRANIAL NERVES: II:  No visual field defects.  Fundoscopic exam shows optic palor bilaterally. III-IV-VI: Pupils equal round and reactive to light.  Normal conjugate, extra-ocular eye movements in all directions of gaze.  No nystagmus.   V:  Normal facial sensation.     VII:  Normal facial symmetry and movements.    VIII:  Normal hearing and vestibular function.   IX-X:  Normal palatal movement.   XI:  Normal shoulder shrug and head rotation.   XII:  Normal tongue strength and range of motion, no deviation or fasciculation.  MOTOR:   No atrophy, fasciculations or abnormal movements.  No pronator drift.  Tone is normal.    Right Upper Extremity:    Left Upper Extremity:    Deltoid  5/5   Deltoid  5/5   Biceps  5/5   Biceps  5/5   Triceps  5/5   Triceps  5/5   Wrist extensors  5/5   Wrist extensors  5/5   Wrist flexors  5/5   Wrist flexors  5/5   Finger extensors  5/5   Finger extensors  5/5   Finger flexors  5/5   Finger flexors  5/5   Dorsal interossei  5/5   Dorsal interossei  5/5   Abductor pollicis  5/5   Abductor pollicis  5/5   Tone (Ashworth scale)  0  Tone (Ashworth scale)  0   Right Lower Extremity:    Left Lower Extremity:    Hip flexors  4+/5   Hip flexors  4+/5   Hip extensors  5-/5   Hip extensors  5-/5   Knee flexors  5-/5   Knee flexors  5-/5   Knee extensors  5-/5   Knee extensors  5-/5   Dorsiflexors  5-/5   Dorsiflexors  5-/5   Plantarflexors  5/5   Plantarflexors   5/5   Toe extensors  5/5   Toe extensors  5/5   Toe flexors  5/5   Toe flexors  5/5   Tone (Ashworth scale)  0  Tone (Ashworth scale)  0   MSRs:  Right                                                                 Left brachioradialis 2+  brachioradialis 2+  biceps 2+  biceps 2+  triceps 2+  triceps 2+  patellar 2+  patellar 2+  ankle jerk 2+  ankle jerk 2+  Hoffman no  Hoffman no  plantar response down  plantar response down   SENSORY:  Normal and symmetric perception of light touch, pinprick, vibration, and proprioception.     COORDINATION/GAIT: Mild dysmetria with finger-to- nose-finger.  Finger tapping is slightly slowed on the left.  She is able to stand by pushing off with arms.  Gait is assisted with walker and shows small, shuffling steps.   IMPRESSION: 1.  Neurosarcoidosis in remission for 10+ years.  Clinically, she is stable off immunosuppressive medications.  Unfortunately, she has had progressive difficulty with stability causing gait problems and has completed physical therapy with no significant improvement.  I have reviewed her notes from [place] and [facility] who also did not have anything further to offer for her gait changes.  She was tried on sinemet 25/100mg  which she takes only at bedtime and has not noticed much benefit.  I discussed fall precautions and the importance for her to do her own home exercises.  Father states that she is not compliant with PT exercises during her sessions or when home exercises were given.    2.  Cognitive changes are most likely due to underlying depression.  She sleeps excessively, has no interested, lacks motivation, and endorses feeling of depression.  I encouraged her to work with her psychologist and/or PCP to better address her mood. Her visual impairment makes it difficult to perform puzzles, etc, so light exercise and music was suggested to offer some cognitive stimulation.  MRI brain was declined. I will check vitamin B12 level to  be complete.  3.  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  The pain in her feet is due to sacral nerve injury as her exam is not consistent with neuropathy.  Appreciate pain management following.  Return to clinic in 9 months.   The duration of this appointment visit was 60 minutes of face-to-face time with the patient.  Greater than 50% of this time was spent in counseling, explanation of diagnosis, planning of further management, and coordination of care.   Thank you for allowing me to participate in patient's care.  If I can answer any additional questions, I would be pleased to do so.    Sincerely,    Donika K. Posey Pronto, DO

## 2017-08-12 ENCOUNTER — Telehealth: Payer: Self-pay | Admitting: *Deleted

## 2017-08-12 NOTE — Telephone Encounter (Signed)
-----   Message from Alda Berthold, DO sent at 08/09/2017  5:49 PM EDT ----- Please notify patient vitamin B12 is normal.  Thank you.

## 2017-08-12 NOTE — Telephone Encounter (Signed)
Dr. Mindi Junker has been given the results.

## 2017-08-15 ENCOUNTER — Telehealth: Payer: Self-pay | Admitting: Internal Medicine

## 2017-08-15 DIAGNOSIS — F341 Dysthymic disorder: Secondary | ICD-10-CM

## 2017-08-15 MED ORDER — FLUOXETINE HCL 20 MG PO TABS: 30.0000 mg | ORAL_TABLET | Freq: Every day | ORAL | 3 refills | Status: DC

## 2017-08-15 NOTE — Telephone Encounter (Signed)
Need to call patient's father to confirm compliant and request to increase medication dosage.

## 2017-08-15 NOTE — Telephone Encounter (Signed)
Called pt father he stating he doesn't think daughter is depress, but she have been sleeping a lot, not wanting to do anything. He states she goes through this cycle, and think the dosage should be increase to help her want to get up and do something...Jamie [name]

## 2017-08-15 NOTE — Telephone Encounter (Signed)
Private caregiver called in stating that patient needed an increase on fluoxetine.  Did inform the caregiver that she was not listed on DPR so I could not speak with her.  Caregiver is requesting a call go to her father Dr. Elmer Sow at [phone] to confirm.  Caregiver states patient is not able to get to the office for a OV.  Would like to know if increase could be done without patient coming in.  Caregiver states patient can not follow plans and orders and does not have the motivation she needs.

## 2017-08-16 NOTE — Telephone Encounter (Signed)
Notified pt father w/[name] response...Jamie [name]

## 2017-08-21 DIAGNOSIS — H5450 Low vision, one eye, unspecified eye: Secondary | ICD-10-CM | POA: Diagnosis not present

## 2017-08-21 DIAGNOSIS — H5440 Blindness, one eye, unspecified eye: Secondary | ICD-10-CM | POA: Diagnosis not present

## 2017-08-21 DIAGNOSIS — D8689 Sarcoidosis of other sites: Secondary | ICD-10-CM | POA: Diagnosis not present

## 2017-08-26 ENCOUNTER — Telehealth: Payer: Self-pay | Admitting: Nurse Practitioner

## 2017-08-26 NOTE — Telephone Encounter (Signed)
PA for Fluoxetine 20 mg tab take 1 and half tab daily started,  Key: [id number]

## 2017-08-29 NOTE — Telephone Encounter (Signed)
Spoke ith Daneil Dan from [employer] her that we received paper from [employer] that PA has been approve.   She said they will recheck again because their system still saying PA requested.   Send approval from [facility] to scan center.

## 2017-09-02 MED ORDER — FLUOXETINE HCL 20 MG PO CAPS: 20.0000 mg | ORAL_CAPSULE | Freq: Every day | ORAL | 0 refills | Status: DC

## 2017-09-02 MED ORDER — FLUOXETINE HCL 10 MG PO CAPS: 10.0000 mg | ORAL_CAPSULE | Freq: Every day | ORAL | 0 refills | Status: DC

## 2017-09-02 NOTE — Addendum Note (Signed)
Addended byShawnie Pons on: 09/02/2017 05:21 PM   Modules accepted: Orders

## 2017-09-02 NOTE — Telephone Encounter (Signed)
Per [place], replace fluoxetine 20 mg tab with Prozac 20 mg cap 1 daily and Prozac 10 mg Cap 1 daily to help with the cost.

## 2017-09-02 NOTE — Telephone Encounter (Signed)
ok 

## 2017-09-02 NOTE — Telephone Encounter (Signed)
Spoke with Cristie Hem from cover my med PA, she said that PA is approve and pharmacy need to run [facility] [phone].   Spoke with [facility], she runs [facility] # and she said this is generic of Fluoxetine call Sarafem 20mg  tab, is this okey?   Please advise.

## 2017-09-23 ENCOUNTER — Ambulatory Visit (INDEPENDENT_AMBULATORY_CARE_PROVIDER_SITE_OTHER): Payer: Medicare Other | Admitting: Internal Medicine

## 2017-09-23 ENCOUNTER — Encounter: Payer: Self-pay | Admitting: Internal Medicine

## 2017-09-23 VITALS — BP 104/60 | HR 74 | Temp 97.7°F | Ht 64.0 in | Wt 118.0 lb

## 2017-09-23 DIAGNOSIS — Z23 Encounter for immunization: Secondary | ICD-10-CM

## 2017-09-23 DIAGNOSIS — F068 Other specified mental disorders due to known physiological condition: Secondary | ICD-10-CM | POA: Diagnosis not present

## 2017-09-23 DIAGNOSIS — F341 Dysthymic disorder: Secondary | ICD-10-CM | POA: Diagnosis not present

## 2017-09-23 MED ORDER — ZOSTER VAC RECOMB ADJUVANTED 50 MCG/0.5ML IM SUSR: 0.5000 mL | Freq: Once | INTRAMUSCULAR | 1 refills | Status: AC

## 2017-09-23 MED ORDER — FLUOXETINE HCL 20 MG PO CAPS: 40.0000 mg | ORAL_CAPSULE | Freq: Every day | ORAL | 1 refills | Status: DC

## 2017-09-23 NOTE — Patient Instructions (Signed)
We have increased the prozac to 2 pills daily.   We have given you the flu shot and the prescription for the shingles vaccine.

## 2017-09-23 NOTE — Assessment & Plan Note (Signed)
Increase prozac to 40 mg daily from 20 mg daily. We talked about counseling as well which they do not want to do.

## 2017-09-23 NOTE — Progress Notes (Signed)
   Subjective:    Patient ID: Jamie [name], female    DOB: December 22, 1955, 61 y.o.   MRN: [id number]  HPI The patient is a 61 YO female coming in for concerns about worsening depression. She is taking prozac 20 mg daily. She was prescribed 30 mg daily but is not taking that. She is sleeping more during the day. She denies crying spells. She denies guilt. No SI/HI. Her bowel and bladder incontinence is making her feel bad from time to time.   Review of Systems  Constitutional: Negative.   Respiratory: Negative for cough, chest tightness and shortness of breath.   Cardiovascular: Negative for chest pain, palpitations and leg swelling.  Gastrointestinal: Negative for abdominal distention, abdominal pain, constipation, diarrhea, nausea and vomiting.  Genitourinary: Positive for frequency.  Musculoskeletal: Negative.   Skin: Negative.   Neurological: Negative.   Psychiatric/Behavioral: Positive for decreased concentration and dysphoric mood. Negative for agitation, behavioral problems, self-injury, sleep disturbance and suicidal ideas. The patient is nervous/anxious.       Objective:   Physical Exam  Constitutional: She is oriented to person, place, and time. She appears well-developed and well-nourished.  HENT:  Head: Normocephalic and atraumatic.  Eyes: EOM are normal.  Neck: Normal range of motion.  Cardiovascular: Normal rate and regular rhythm.   Pulmonary/Chest: Effort normal and breath sounds normal. No respiratory distress. She has no wheezes. She has no rales.  Abdominal: Soft. She exhibits no distension. There is no tenderness. There is no rebound.  Neurological: She is alert and oriented to person, place, and time. Coordination abnormal.  Skin: Skin is warm and dry.   Vitals:   09/23/17 1013  BP: 104/60  Pulse: 74  Temp: 97.7 F (36.5 C)  TempSrc: Oral  SpO2: 99%  Weight: 118 lb (53.5 kg)  Height: 5\' 4"  (1.626 m)      Assessment & Plan:  Flu shot given at visit

## 2017-09-23 NOTE — Assessment & Plan Note (Signed)
Increase prozac to 40 mg daily

## 2017-10-02 DIAGNOSIS — N319 Neuromuscular dysfunction of bladder, unspecified: Secondary | ICD-10-CM | POA: Diagnosis not present

## 2017-10-02 DIAGNOSIS — N3020 Other chronic cystitis without hematuria: Secondary | ICD-10-CM | POA: Diagnosis not present

## 2017-10-11 ENCOUNTER — Ambulatory Visit: Payer: Medicare Other | Admitting: Podiatry

## 2017-10-16 ENCOUNTER — Encounter (HOSPITAL_COMMUNITY): Payer: Self-pay | Admitting: *Deleted

## 2017-10-16 ENCOUNTER — Emergency Department (HOSPITAL_COMMUNITY)
Admission: EM | Admit: 2017-10-16 | Discharge: 2017-10-17 | Disposition: A | Discharge status: Home / Self Care | Payer: Medicare Other | Attending: Emergency Medicine | Admitting: Emergency Medicine

## 2017-10-16 ENCOUNTER — Emergency Department (HOSPITAL_COMMUNITY): Payer: Medicare Other

## 2017-10-16 DIAGNOSIS — D869 Sarcoidosis, unspecified: Secondary | ICD-10-CM | POA: Insufficient documentation

## 2017-10-16 DIAGNOSIS — Y999 Unspecified external cause status: Secondary | ICD-10-CM | POA: Insufficient documentation

## 2017-10-16 DIAGNOSIS — Y939 Activity, unspecified: Secondary | ICD-10-CM | POA: Insufficient documentation

## 2017-10-16 DIAGNOSIS — S199XXA Unspecified injury of neck, initial encounter: Secondary | ICD-10-CM | POA: Diagnosis not present

## 2017-10-16 DIAGNOSIS — Z79899 Other long term (current) drug therapy: Secondary | ICD-10-CM | POA: Diagnosis not present

## 2017-10-16 DIAGNOSIS — F99 Mental disorder, not otherwise specified: Secondary | ICD-10-CM | POA: Diagnosis not present

## 2017-10-16 DIAGNOSIS — W19XXXA Unspecified fall, initial encounter: Secondary | ICD-10-CM

## 2017-10-16 DIAGNOSIS — W1811XA Fall from or off toilet without subsequent striking against object, initial encounter: Secondary | ICD-10-CM | POA: Diagnosis not present

## 2017-10-16 DIAGNOSIS — S098XXA Other specified injuries of head, initial encounter: Secondary | ICD-10-CM | POA: Diagnosis not present

## 2017-10-16 DIAGNOSIS — E039 Hypothyroidism, unspecified: Secondary | ICD-10-CM | POA: Diagnosis not present

## 2017-10-16 DIAGNOSIS — S0990XA Unspecified injury of head, initial encounter: Secondary | ICD-10-CM

## 2017-10-16 DIAGNOSIS — Y92009 Unspecified place in unspecified non-institutional (private) residence as the place of occurrence of the external cause: Secondary | ICD-10-CM | POA: Insufficient documentation

## 2017-10-16 DIAGNOSIS — R69 Illness, unspecified: Secondary | ICD-10-CM

## 2017-10-16 LAB — CBC
HEMATOCRIT: 30.9 % — AB (ref 36.0–46.0)
HEMOGLOBIN: 11.2 g/dL — AB (ref 12.0–15.0)
MCH: 28.4 pg (ref 26.0–34.0)
MCHC: 36.2 g/dL — AB (ref 30.0–36.0)
MCV: 78.4 fL (ref 78.0–100.0)
Platelets: 325 10*3/uL (ref 150–400)
RBC: 3.94 MIL/uL (ref 3.87–5.11)
RDW: 15.4 % (ref 11.5–15.5)
WBC: 10.7 10*3/uL — ABNORMAL HIGH (ref 4.0–10.5)

## 2017-10-16 LAB — COMPREHENSIVE METABOLIC PANEL
ALBUMIN: 3.2 g/dL — AB (ref 3.5–5.0)
ALT: 7 U/L — ABNORMAL LOW (ref 14–54)
ANION GAP: 10 (ref 5–15)
AST: 25 U/L (ref 15–41)
Alkaline Phosphatase: 77 U/L (ref 38–126)
BILIRUBIN TOTAL: 0.5 mg/dL (ref 0.3–1.2)
BUN: 24 mg/dL — AB (ref 6–20)
CO2: 23 mmol/L (ref 22–32)
Calcium: 9 mg/dL (ref 8.9–10.3)
Chloride: 107 mmol/L (ref 101–111)
Creatinine, Ser: 1.01 mg/dL — ABNORMAL HIGH (ref 0.44–1.00)
GFR calc Af Amer: >60 mL/min (ref >60)
GFR calc non Af Amer: 59 mL/min — ABNORMAL LOW (ref >60)
GLUCOSE: 141 mg/dL — AB (ref 65–99)
POTASSIUM: 3.9 mmol/L (ref 3.5–5.1)
SODIUM: 140 mmol/L (ref 135–145)
TOTAL PROTEIN: 6.7 g/dL (ref 6.5–8.1)

## 2017-10-16 NOTE — ED Provider Notes (Signed)
[facility] EMERGENCY DEPARTMENT Provider Note   CSN: [id number] Arrival date & time: 10/16/17  1548     History   Chief Complaint Chief Complaint  Patient presents with  . Fall    HPI Jamie [name] is a 61 y.o. female.  HPI Patient had been standing from using the restroom.  She reports she was reaching for her walker and she reports it all happened so fast she is not sure if she lost her balance or exactly why she fell.  But she struck her forehead on the floor.  She denies loss of consciousness.  She reports that she was able to crawl and get back up in order to call for help.  Reports she continues to have frontal headache since that episode.  No nausea no vomiting.  Not on anticoagulants.  Reports she also has some neck stiffness and pain in the back of her neck.  Does have a significant chronic medical illness of neurosarcoidosis with instability and legal blindness.  She reports since the fall she is felt slightly more dizzy than normal but did not think that dizziness caused her to fall initially.  Caregivers report that she does have decreased appetite and has to have a lot of encouragement to eat and drink. Past Medical History:  Diagnosis Date  . Depression   . Hypothyroidism   . Neurosarcoidosis in adult    followed at [facility]  . 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 Progressive gait disorder    Was treated for probable MS until diagnosed with Neurosarcoidosis about 10 years ago.   . Sacral fracture ([place])    2016 no surgery / incontinent wears brief  and numbness in pelvi area  . Thyroid disease     Patient Active Problem List   Diagnosis Date Noted  . Right lumbar radiculopathy 05/10/2017  . Peripheral neuropathy 05/10/2017  . 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 Depression 12/12/2016  . Dysphagia 10/14/2016  . Lower extremity edema 05/09/2016  . Rash and  nonspecific skin eruption 05/09/2016  . Hypothyroidism 11/26/2015  . 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 Neurogenic bowel 05/24/2015  . 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 Leg weakness   . Sacral fracture, closed ([place]) 05/21/2015  . 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 SNHL (sensorineural hearing loss) 08/21/2013  . Tinnitus 08/21/2013  . 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 Intertrigo 11/16/2011    Past Surgical History:  Procedure Laterality Date  . CYSTOSCOPY WITH BIOPSY N/A 05/17/2017   Procedure: CYSTOSCOPY WITH BIOPSY;  Surgeon: Alexis Frock, MD;  Location: [facility] ORS;  Service: Urology;  Laterality: N/A;  . CYSTOSCOPY WITH FULGERATION N/A 05/17/2017   Procedure: CYSTOSCOPY BILATERAL RETROGRADE PYLOGRAM, BLADDER BIOPSY WITH FULGERATION;  Surgeon: Alexis Frock, MD;  Location: [facility] OR[facility];  Service: Urology;  Laterality: N/A;  . TONSILLECTOMY      OB History    No data available       Home Medications    Prior to Admission medications   Medication Sig Start Date End Date Taking? Authorizing Provider  carbidopa-levodopa (SINEMET IR) 25-100 MG tablet Take 1 tablet by mouth 3 (three) times daily. Reported on 06/25/2016 06/12/17  Yes Nche, Charlene Brooke, NP  cephALEXin (KEFLEX) 500 MG capsule Take 500 mg by mouth 3 (three) times daily. Started 10/07/17 for 7 days   Yes [provider]  FLUoxetine (PROZAC) 20 MG capsule Take 2 capsules (40  mg total) by mouth daily. 09/23/17  Yes Hoyt Koch, MD  gabapentin (NEURONTIN) 300 MG capsule Take 1 capsule (300 mg total) by mouth 3 (three) times daily. 06/12/17  Yes Nche, Charlene Brooke, NP  levothyroxine (SYNTHROID, LEVOTHROID) 100 MCG tablet TAKE ONE TABLET BY MOUTH ONCE DAILY Patient taking differently: TAKE ONE TABLET BY MOUTH EVERY EVENING 10/03/16  Yes Hoyt Koch, MD  Multiple Vitamin (MULTIVITAMIN WITH MINERALS) TABS tablet Take 1 tablet by mouth daily. 06/10/15  Yes Love, Ivan Anchors, PA-C  trimethoprim (TRIMPEX)  100 MG tablet Take 1 tablet (100 mg total) by mouth daily. 06/12/17  Yes Nche, Charlene Brooke, NP  acetaminophen (TYLENOL) 325 MG tablet Take 1-2 tablets (325-650 mg total) by mouth every 4 (four) hours as needed for mild pain. Patient not taking: Reported on 10/16/2017 06/10/15   Love, Ivan Anchors, PA-C  naproxen (NAPROSYN) 375 MG tablet Take 1 tablet (375 mg total) by mouth 2 (two) times daily. 10/17/17   Charlesetta Shanks, MD  orphenadrine (NORFLEX) 100 MG tablet Take 1 tablet (100 mg total) by mouth 2 (two) times daily. 10/17/17   Charlesetta Shanks, MD  pantoprazole (PROTONIX) 40 MG tablet Take 1 tablet (40 mg total) by mouth daily. Patient not taking: Reported on 10/16/2017 10/12/16   Hoyt Koch, MD    Family History Family History  Problem Relation Age of Onset  . Hypertension Father     Social History Social History  Substance Use Topics  . Smoking status: Never Smoker  . Smokeless tobacco: Never Used  . Alcohol use No     Allergies   Remicade [infliximab] and Penicillins   Review of Systems Review of Systems 10 Systems reviewed and are negative for acute change except as noted in the HPI. Physical Exam Updated Vital Signs BP (!) 142/76   Pulse 68   Temp 98.9 F (37.2 C)   Resp 15   Ht 5\' 4"  (1.626 m)   Wt 54.4 kg (120 lb)   SpO2 100%   BMI 20.60 kg/m   Physical Exam  Constitutional:  She is alert and nontoxic.  No respiratory distress.  Interactive and pleasant.  HENT:  Mild area of tenderness and subtle hematoma to left forehead.  No abrasion or laceration.  No nasal bleeding.  Dentition intact.  Eyes:  Patient is legally blind but extraocular motions are intact to command.  Neck:  She endorses tenderness in the posterior neck centrally.  No step-off.  No severe pain to palpation.  Cardiovascular: Normal rate, regular rhythm and normal heart sounds.   Pulmonary/Chest: Effort normal and breath sounds normal. She exhibits no tenderness.  Abdominal: Soft.  She exhibits no distension. There is no tenderness.  Musculoskeletal:  No extremity deformities.  Contusion to the left knee, mild.  No effusion.  Neurological:  Patient is alert and appropriate.  She follows commands appropriately.  She does have chronic neurologic dysfunction and weakness but moves all extremities at will.  Skin: Skin is warm and dry.  Psychiatric: She has a normal mood and affect.     ED Treatments / Results  Labs (all labs ordered are listed, but only abnormal results are displayed) Labs Reviewed  COMPREHENSIVE METABOLIC PANEL - Abnormal; Notable for the following:       Result Value   Glucose, Bld 141 (*)    BUN 24 (*)    Creatinine, Ser 1.01 (*)    Albumin 3.2 (*)    ALT 7 (*)    GFR  calc non Af Amer 59 (*)    All other components within normal limits  CBC - Abnormal; Notable for the following:    WBC 10.7 (*)    Hemoglobin 11.2 (*)    HCT 30.9 (*)    MCHC 36.2 (*)    All other components within normal limits    EKG  EKG Interpretation  Date/Time:  Wednesday October 16 2017 16:05:13 EDT Ventricular Rate:  86 PR Interval:  128 QRS Duration: 116 QT Interval:  400 QTC Calculation: 478 R Axis:   -8 Text Interpretation:  Normal sinus rhythm Right bundle branch block Abnormal ECG no change fromprevious Confirmed by Charlesetta Shanks [phone]) on 10/16/2017 9:59:18 PM       Radiology Ct Head Wo Contrast  Result Date: 10/17/2017 CLINICAL DATA:  Fall getting up from toilet. EXAM: CT HEAD WITHOUT CONTRAST CT CERVICAL SPINE WITHOUT CONTRAST TECHNIQUE: Multidetector CT imaging of the head and cervical spine was performed following the standard protocol without intravenous contrast. Multiplanar CT image reconstructions of the cervical spine were also generated. COMPARISON:  Head CT 05/20/2015, cervical spine MRI 05/20/2015 FINDINGS: CT HEAD FINDINGS Brain: No intracranial hemorrhage. Stable diffuse ventricular dilatation, prominent involving the fourth  ventricle. Stable marked cerebellar atrophy. Periventricular white matter changes and small white matter lesion in the right frontal lobe are also unchanged from prior exam. No subdural collection. No midline shift or mass effect. Vascular: Minimal atherosclerosis of skullbase vasculature. No hyperdense vessel. Skull: No fracture or focal lesion. Thinning of the right frontoparietal calvarium is unchanged. Sinuses/Orbits: Paranasal sinuses and mastoid air cells are clear. The visualized orbits are unremarkable. Frontal sinuses are hypo pneumatized, incidental. Unchanged small mucous retention cyst in the right maxillary sinus. Other: None. CT CERVICAL SPINE FINDINGS Alignment: Mild reversal of normal lordosis, similar to prior MRI. No traumatic subluxation. Skull base and vertebrae: None fusion posterior arch of C1, normal variant. No acute fracture. Vertebral body heights are maintained. The dens and skull base are intact. Soft tissues and spinal canal: No prevertebral fluid or swelling. No visible canal hematoma. Disc levels:  Mild disc space narrowing at C4-C5 and C5-C6. Upper chest: Negative. Other: Carotid calcifications. IMPRESSION: 1. No acute intracranial abnormality. No skull fracture. Similar appearance of brain from 2016 with ventricular prominence, cerebellar atrophy and periventricular white matter changes. 2. 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 Electronically Signed   By: Jeb Levering M.D.   On: 10/17/2017 00:06   Ct Cervical Spine Wo Contrast  Result Date: 10/17/2017 CLINICAL DATA:  Fall getting up from toilet. EXAM: CT HEAD WITHOUT CONTRAST CT CERVICAL SPINE WITHOUT CONTRAST TECHNIQUE: Multidetector CT imaging of the head and cervical spine was performed following the standard protocol without intravenous contrast. Multiplanar CT image reconstructions of the cervical spine were also generated. COMPARISON:  Head CT 05/20/2015, cervical spine  MRI 05/20/2015 FINDINGS: CT HEAD FINDINGS Brain: No intracranial hemorrhage. Stable diffuse ventricular dilatation, prominent involving the fourth ventricle. Stable marked cerebellar atrophy. Periventricular white matter changes and small white matter lesion in the right frontal lobe are also unchanged from prior exam. No subdural collection. No midline shift or mass effect. Vascular: Minimal atherosclerosis of skullbase vasculature. No hyperdense vessel. Skull: No fracture or focal lesion. Thinning of the right frontoparietal calvarium is unchanged. Sinuses/Orbits: Paranasal sinuses and mastoid air cells are clear. The visualized orbits are unremarkable. Frontal sinuses are hypo pneumatized, incidental. Unchanged small mucous retention cyst in the right maxillary sinus. Other: None. CT CERVICAL  SPINE FINDINGS Alignment: Mild reversal of normal lordosis, similar to prior MRI. No traumatic subluxation. Skull base and vertebrae: None fusion posterior arch of C1, normal variant. No acute fracture. Vertebral body heights are maintained. The dens and skull base are intact. Soft tissues and spinal canal: No prevertebral fluid or swelling. No visible canal hematoma. Disc levels:  Mild disc space narrowing at C4-C5 and C5-C6. Upper chest: Negative. Other: Carotid calcifications. IMPRESSION: 1. No acute intracranial abnormality. No skull fracture. Similar appearance of brain from 2016 with ventricular prominence, cerebellar atrophy and periventricular white matter changes. 2. 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 Electronically Signed   By: Jeb Levering M.D.   On: 10/17/2017 00:06    Procedures Procedures (including critical care time)  Medications Ordered in ED Medications - No data to display   Initial Impression / Assessment and Plan / ED Course  I have reviewed the triage vital signs and the nursing notes.  Pertinent labs & imaging results that were  available during my care of the patient were reviewed by me and considered in my medical decision making (see chart for details).      Final Clinical Impressions(s) / ED Diagnoses   Final diagnoses:  Fall in home, initial encounter  Injury of head, initial encounter  Severe comorbid illness   Patient has significant baseline gait instability and neurologic weakness.  She however does function independently and fell today trying to use the bathroom.  CT had obtained to rule out intracranial bleed or injury.  Mental status is clear.  Patient is functionally at baseline.  No orthostasis however mild elevation in BUN and creat suggest some mild dehydration may be contributing to lightheadedness with position change.  Head injury precautions given.  Encouragement for p.o. intake.  Patient has caregivers present as well. New Prescriptions New Prescriptions   NAPROXEN (NAPROSYN) 375 MG TABLET    Take 1 tablet (375 mg total) by mouth 2 (two) times daily.   ORPHENADRINE (NORFLEX) 100 MG TABLET    Take 1 tablet (100 mg total) by mouth 2 (two) times daily.     Charlesetta Shanks, MD 10/17/17 [phone]

## 2017-10-16 NOTE — ED Notes (Signed)
Patient transported to CT 

## 2017-10-16 NOTE — ED Notes (Signed)
Pt is legally blind. 

## 2017-10-16 NOTE — ED Triage Notes (Signed)
The pt is blind and today she fell while getting up from the toilet  She fell face forward injuring her knees.   She is c/o some forehead pain  She does not know why she fell  She has a caregiver that brought her

## 2017-10-17 MED ORDER — NAPROXEN 375 MG PO TABS: 375.0000 mg | ORAL_TABLET | Freq: Two times a day (BID) | ORAL | 0 refills | Status: DC

## 2017-10-17 MED ORDER — ORPHENADRINE CITRATE ER 100 MG PO TB12: 100.0000 mg | ORAL_TABLET | Freq: Two times a day (BID) | ORAL | 0 refills | Status: DC

## 2017-10-22 ENCOUNTER — Telehealth: Payer: Self-pay | Admitting: Internal Medicine

## 2017-10-22 NOTE — Telephone Encounter (Signed)
Pt sister called and needs home health physical therapy, please advise, they would like to use  [facility]

## 2017-10-22 NOTE — Telephone Encounter (Signed)
Due to her medicare we would need to see her for the ER follow up to get this ordered since it is for a new problem.

## 2017-10-23 NOTE — Telephone Encounter (Signed)
LVM informing that the patient needs an ER follow up appointment in order to get the physical therapy since this is a new problem for the patient

## 2017-10-25 ENCOUNTER — Ambulatory Visit (INDEPENDENT_AMBULATORY_CARE_PROVIDER_SITE_OTHER): Payer: Medicare Other | Admitting: Internal Medicine

## 2017-10-25 ENCOUNTER — Encounter: Payer: Self-pay | Admitting: Internal Medicine

## 2017-10-25 VITALS — BP 110/78 | HR 70 | Temp 97.8°F | Ht 64.0 in | Wt 113.0 lb

## 2017-10-25 DIAGNOSIS — R296 Repeated falls: Secondary | ICD-10-CM | POA: Diagnosis not present

## 2017-10-25 DIAGNOSIS — R2681 Unsteadiness on feet: Secondary | ICD-10-CM | POA: Diagnosis not present

## 2017-10-25 NOTE — Progress Notes (Signed)
   Subjective:    Patient ID: Jamie [name], female    DOB: Apr 16, 1956, 61 y.o.   MRN: [id number]  HPI The patient is a 61 YO female coming in for ER follow up (in for fall, decreased PO intake, evaluated and treated for UTI with keflex). She is weak at home. She is not taking her medications like she should. Her father helps out with her health and is secondary caregiver. He has had fall with hip fracture and is currently not living with her. Her sister is here with her today. She is concerned about her safety.   Review of Systems  Constitutional: Positive for activity change and appetite change. Negative for chills, fever and unexpected weight change.  Respiratory: Negative for cough, chest tightness and shortness of breath.   Cardiovascular: Negative for chest pain, palpitations and leg swelling.  Gastrointestinal: Negative for abdominal distention, abdominal pain, constipation, diarrhea, nausea and vomiting.  Musculoskeletal: Negative.   Skin: Negative.   Neurological: Positive for weakness and numbness.  Psychiatric/Behavioral: Negative.       Objective:   Physical Exam  Constitutional: She is oriented to person, place, and time. She appears well-developed and well-nourished.  HENT:  Head: Normocephalic and atraumatic.  Eyes: EOM are normal.  Neck: Normal range of motion.  Cardiovascular: Normal rate and regular rhythm.  Pulmonary/Chest: Effort normal and breath sounds normal. No respiratory distress. She has no wheezes. She has no rales.  Abdominal: Soft. Bowel sounds are normal. She exhibits no distension. There is no tenderness. There is no rebound.  Musculoskeletal: She exhibits no edema.  Neurological: She is alert and oriented to person, place, and time. Coordination abnormal.  Walker for ambulation  Skin: Skin is warm and dry.  Psychiatric: She has a normal mood and affect.   Vitals:   10/25/17 1038  BP: 110/78  Pulse: 70  Temp: 97.8 F (36.6 C)  TempSrc: Oral  SpO2:  99%  Weight: 113 lb (51.3 kg)  Height: 5\' 4"  (1.626 m)      Assessment & Plan:

## 2017-10-25 NOTE — Assessment & Plan Note (Signed)
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 She is without injury from fall but deconditioned with UTI and decreased activity at home. Ordered home health OT/PT and nursing.

## 2017-10-25 NOTE — Patient Instructions (Signed)
We will get the physical therapist out to the house. Alvis Lemmings

## 2017-10-26 ENCOUNTER — Other Ambulatory Visit: Payer: Self-pay | Admitting: Nurse Practitioner

## 2017-10-26 DIAGNOSIS — N312 Flaccid neuropathic bladder, not elsewhere classified: Secondary | ICD-10-CM

## 2017-10-26 DIAGNOSIS — R296 Repeated falls: Secondary | ICD-10-CM | POA: Diagnosis not present

## 2017-10-26 DIAGNOSIS — R2689 Other abnormalities of gait and mobility: Secondary | ICD-10-CM | POA: Diagnosis not present

## 2017-10-26 DIAGNOSIS — N390 Urinary tract infection, site not specified: Secondary | ICD-10-CM

## 2017-10-28 ENCOUNTER — Telehealth: Payer: Self-pay | Admitting: Internal Medicine

## 2017-10-28 DIAGNOSIS — R2689 Other abnormalities of gait and mobility: Secondary | ICD-10-CM | POA: Diagnosis not present

## 2017-10-28 DIAGNOSIS — R296 Repeated falls: Secondary | ICD-10-CM | POA: Diagnosis not present

## 2017-10-28 NOTE — Telephone Encounter (Signed)
Fine

## 2017-10-28 NOTE — Telephone Encounter (Signed)
Called asking if the patient is suppposed to be taking this and if so she needs a refill

## 2017-10-28 NOTE — Telephone Encounter (Addendum)
Needs verbals for skilled nursing for 2week2 1week2 Please advise  Not much is needed she will be addressing the patients depression her father is in rehab and the patient does not remember to take her meds so they will be figuring out some kind of alarm for her to remember

## 2017-10-28 NOTE — Telephone Encounter (Signed)
Please advise, pt saw Dr. Sharlet Salina since 05/2017 and pt report not taking when she saw [facility] 10/25/2017.

## 2017-10-28 NOTE — Telephone Encounter (Signed)
Verbals given  

## 2017-10-29 DIAGNOSIS — R296 Repeated falls: Secondary | ICD-10-CM | POA: Diagnosis not present

## 2017-10-29 DIAGNOSIS — R2689 Other abnormalities of gait and mobility: Secondary | ICD-10-CM | POA: Diagnosis not present

## 2017-10-29 NOTE — Telephone Encounter (Signed)
Is patient supposed to be taking this?

## 2017-10-30 DIAGNOSIS — R2689 Other abnormalities of gait and mobility: Secondary | ICD-10-CM | POA: Diagnosis not present

## 2017-10-30 DIAGNOSIS — R296 Repeated falls: Secondary | ICD-10-CM | POA: Diagnosis not present

## 2017-10-31 DIAGNOSIS — R2689 Other abnormalities of gait and mobility: Secondary | ICD-10-CM | POA: Diagnosis not present

## 2017-10-31 DIAGNOSIS — R296 Repeated falls: Secondary | ICD-10-CM | POA: Diagnosis not present

## 2017-11-01 DIAGNOSIS — R296 Repeated falls: Secondary | ICD-10-CM | POA: Diagnosis not present

## 2017-11-01 DIAGNOSIS — R2689 Other abnormalities of gait and mobility: Secondary | ICD-10-CM | POA: Diagnosis not present

## 2017-11-04 DIAGNOSIS — R2689 Other abnormalities of gait and mobility: Secondary | ICD-10-CM | POA: Diagnosis not present

## 2017-11-04 DIAGNOSIS — R296 Repeated falls: Secondary | ICD-10-CM | POA: Diagnosis not present

## 2017-11-05 DIAGNOSIS — R296 Repeated falls: Secondary | ICD-10-CM | POA: Diagnosis not present

## 2017-11-05 DIAGNOSIS — R2689 Other abnormalities of gait and mobility: Secondary | ICD-10-CM | POA: Diagnosis not present

## 2017-11-06 DIAGNOSIS — R296 Repeated falls: Secondary | ICD-10-CM | POA: Diagnosis not present

## 2017-11-06 DIAGNOSIS — R2689 Other abnormalities of gait and mobility: Secondary | ICD-10-CM | POA: Diagnosis not present

## 2017-11-08 DIAGNOSIS — R296 Repeated falls: Secondary | ICD-10-CM | POA: Diagnosis not present

## 2017-11-08 DIAGNOSIS — R2689 Other abnormalities of gait and mobility: Secondary | ICD-10-CM | POA: Diagnosis not present

## 2017-11-09 DIAGNOSIS — R296 Repeated falls: Secondary | ICD-10-CM | POA: Diagnosis not present

## 2017-11-09 DIAGNOSIS — R2689 Other abnormalities of gait and mobility: Secondary | ICD-10-CM | POA: Diagnosis not present

## 2017-11-12 DIAGNOSIS — R2689 Other abnormalities of gait and mobility: Secondary | ICD-10-CM | POA: Diagnosis not present

## 2017-11-12 DIAGNOSIS — Z9181 History of falling: Secondary | ICD-10-CM | POA: Diagnosis not present

## 2017-11-12 DIAGNOSIS — S344XXS Injury of lumbosacral plexus, sequela: Secondary | ICD-10-CM | POA: Diagnosis not present

## 2017-11-12 DIAGNOSIS — G629 Polyneuropathy, unspecified: Secondary | ICD-10-CM | POA: Diagnosis not present

## 2017-11-12 DIAGNOSIS — H547 Unspecified visual loss: Secondary | ICD-10-CM | POA: Diagnosis not present

## 2017-11-12 DIAGNOSIS — Z87440 Personal history of urinary (tract) infections: Secondary | ICD-10-CM | POA: Diagnosis not present

## 2017-11-12 DIAGNOSIS — R296 Repeated falls: Secondary | ICD-10-CM | POA: Diagnosis not present

## 2017-11-13 DIAGNOSIS — R2689 Other abnormalities of gait and mobility: Secondary | ICD-10-CM | POA: Diagnosis not present

## 2017-11-13 DIAGNOSIS — R296 Repeated falls: Secondary | ICD-10-CM | POA: Diagnosis not present

## 2017-11-14 DIAGNOSIS — R296 Repeated falls: Secondary | ICD-10-CM | POA: Diagnosis not present

## 2017-11-14 DIAGNOSIS — R2689 Other abnormalities of gait and mobility: Secondary | ICD-10-CM | POA: Diagnosis not present

## 2017-11-18 DIAGNOSIS — R2689 Other abnormalities of gait and mobility: Secondary | ICD-10-CM | POA: Diagnosis not present

## 2017-11-18 DIAGNOSIS — R296 Repeated falls: Secondary | ICD-10-CM | POA: Diagnosis not present

## 2017-11-19 DIAGNOSIS — R296 Repeated falls: Secondary | ICD-10-CM | POA: Diagnosis not present

## 2017-11-19 DIAGNOSIS — R2689 Other abnormalities of gait and mobility: Secondary | ICD-10-CM | POA: Diagnosis not present

## 2017-11-21 DIAGNOSIS — R296 Repeated falls: Secondary | ICD-10-CM | POA: Diagnosis not present

## 2017-11-21 DIAGNOSIS — R2689 Other abnormalities of gait and mobility: Secondary | ICD-10-CM | POA: Diagnosis not present

## 2017-11-26 DIAGNOSIS — R2689 Other abnormalities of gait and mobility: Secondary | ICD-10-CM | POA: Diagnosis not present

## 2017-11-26 DIAGNOSIS — R296 Repeated falls: Secondary | ICD-10-CM | POA: Diagnosis not present

## 2017-11-28 DIAGNOSIS — R296 Repeated falls: Secondary | ICD-10-CM | POA: Diagnosis not present

## 2017-11-28 DIAGNOSIS — R2689 Other abnormalities of gait and mobility: Secondary | ICD-10-CM | POA: Diagnosis not present

## 2017-12-04 DIAGNOSIS — R2689 Other abnormalities of gait and mobility: Secondary | ICD-10-CM | POA: Diagnosis not present

## 2017-12-04 DIAGNOSIS — R296 Repeated falls: Secondary | ICD-10-CM | POA: Diagnosis not present

## 2017-12-12 DIAGNOSIS — R296 Repeated falls: Secondary | ICD-10-CM | POA: Diagnosis not present

## 2017-12-12 DIAGNOSIS — R2689 Other abnormalities of gait and mobility: Secondary | ICD-10-CM | POA: Diagnosis not present

## 2017-12-13 DIAGNOSIS — R296 Repeated falls: Secondary | ICD-10-CM | POA: Diagnosis not present

## 2017-12-13 DIAGNOSIS — R2689 Other abnormalities of gait and mobility: Secondary | ICD-10-CM | POA: Diagnosis not present

## 2017-12-18 DIAGNOSIS — R296 Repeated falls: Secondary | ICD-10-CM | POA: Diagnosis not present

## 2017-12-18 DIAGNOSIS — R2689 Other abnormalities of gait and mobility: Secondary | ICD-10-CM | POA: Diagnosis not present

## 2017-12-20 ENCOUNTER — Telehealth: Payer: Self-pay | Admitting: Internal Medicine

## 2017-12-20 DIAGNOSIS — G629 Polyneuropathy, unspecified: Secondary | ICD-10-CM

## 2017-12-20 DIAGNOSIS — R296 Repeated falls: Secondary | ICD-10-CM | POA: Diagnosis not present

## 2017-12-20 DIAGNOSIS — R2689 Other abnormalities of gait and mobility: Secondary | ICD-10-CM | POA: Diagnosis not present

## 2017-12-20 NOTE — Telephone Encounter (Signed)
Are they asking for standard walker? Walker with wheels? Walker with seat?

## 2017-12-20 NOTE — Telephone Encounter (Signed)
Sent!

## 2017-12-20 NOTE — Telephone Encounter (Signed)
Copied from [place] [phone]. Topic: Quick Communication - See Telephone Encounter >> Dec 20, 2017 10:48 AM Jamie [name] wrote: CRM for notification. See Telephone encounter for:  Pt is due a new walker,  Erlene Quan from [place]  home health is asking order for walker be faxed to [phone]    his contact number is [phone]

## 2017-12-20 NOTE — Telephone Encounter (Signed)
Order placed for advanced, please message Darlina Guys

## 2017-12-20 NOTE — Telephone Encounter (Signed)
Requesting aTwo wheeled rolling walker.

## 2017-12-23 DIAGNOSIS — R2689 Other abnormalities of gait and mobility: Secondary | ICD-10-CM | POA: Diagnosis not present

## 2017-12-23 DIAGNOSIS — R296 Repeated falls: Secondary | ICD-10-CM | POA: Diagnosis not present

## 2017-12-25 DIAGNOSIS — G629 Polyneuropathy, unspecified: Secondary | ICD-10-CM | POA: Diagnosis not present

## 2017-12-25 DIAGNOSIS — S344XXS Injury of lumbosacral plexus, sequela: Secondary | ICD-10-CM | POA: Diagnosis not present

## 2017-12-26 DIAGNOSIS — G629 Polyneuropathy, unspecified: Secondary | ICD-10-CM | POA: Diagnosis not present

## 2017-12-26 DIAGNOSIS — S344XXS Injury of lumbosacral plexus, sequela: Secondary | ICD-10-CM | POA: Diagnosis not present

## 2018-01-01 DIAGNOSIS — G629 Polyneuropathy, unspecified: Secondary | ICD-10-CM | POA: Diagnosis not present

## 2018-01-01 DIAGNOSIS — S344XXS Injury of lumbosacral plexus, sequela: Secondary | ICD-10-CM | POA: Diagnosis not present

## 2018-01-03 DIAGNOSIS — G629 Polyneuropathy, unspecified: Secondary | ICD-10-CM | POA: Diagnosis not present

## 2018-01-03 DIAGNOSIS — S344XXS Injury of lumbosacral plexus, sequela: Secondary | ICD-10-CM | POA: Diagnosis not present

## 2018-01-06 DIAGNOSIS — Z87440 Personal history of urinary (tract) infections: Secondary | ICD-10-CM | POA: Diagnosis not present

## 2018-01-06 DIAGNOSIS — G629 Polyneuropathy, unspecified: Secondary | ICD-10-CM | POA: Diagnosis not present

## 2018-01-06 DIAGNOSIS — N319 Neuromuscular dysfunction of bladder, unspecified: Secondary | ICD-10-CM | POA: Diagnosis not present

## 2018-01-06 DIAGNOSIS — Z9181 History of falling: Secondary | ICD-10-CM | POA: Diagnosis not present

## 2018-01-06 DIAGNOSIS — H547 Unspecified visual loss: Secondary | ICD-10-CM | POA: Diagnosis not present

## 2018-01-06 DIAGNOSIS — S344XXS Injury of lumbosacral plexus, sequela: Secondary | ICD-10-CM | POA: Diagnosis not present

## 2018-01-07 DIAGNOSIS — S344XXS Injury of lumbosacral plexus, sequela: Secondary | ICD-10-CM | POA: Diagnosis not present

## 2018-01-07 DIAGNOSIS — G629 Polyneuropathy, unspecified: Secondary | ICD-10-CM | POA: Diagnosis not present

## 2018-01-21 DIAGNOSIS — G629 Polyneuropathy, unspecified: Secondary | ICD-10-CM | POA: Diagnosis not present

## 2018-01-21 DIAGNOSIS — S344XXS Injury of lumbosacral plexus, sequela: Secondary | ICD-10-CM | POA: Diagnosis not present

## 2018-01-23 DIAGNOSIS — G629 Polyneuropathy, unspecified: Secondary | ICD-10-CM | POA: Diagnosis not present

## 2018-01-23 DIAGNOSIS — S344XXS Injury of lumbosacral plexus, sequela: Secondary | ICD-10-CM | POA: Diagnosis not present

## 2018-01-27 ENCOUNTER — Ambulatory Visit (INDEPENDENT_AMBULATORY_CARE_PROVIDER_SITE_OTHER): Payer: Medicare Other | Admitting: Nurse Practitioner

## 2018-01-27 ENCOUNTER — Ambulatory Visit (INDEPENDENT_AMBULATORY_CARE_PROVIDER_SITE_OTHER): Payer: Medicare Other

## 2018-01-27 ENCOUNTER — Encounter: Payer: Self-pay | Admitting: Nurse Practitioner

## 2018-01-27 VITALS — BP 110/76 | HR 87 | Temp 97.5°F | Ht 64.0 in | Wt 109.0 lb

## 2018-01-27 DIAGNOSIS — R05 Cough: Secondary | ICD-10-CM

## 2018-01-27 DIAGNOSIS — H6983 Other specified disorders of Eustachian tube, bilateral: Secondary | ICD-10-CM | POA: Diagnosis not present

## 2018-01-27 DIAGNOSIS — R8290 Unspecified abnormal findings in urine: Secondary | ICD-10-CM

## 2018-01-27 DIAGNOSIS — K5900 Constipation, unspecified: Secondary | ICD-10-CM

## 2018-01-27 DIAGNOSIS — R197 Diarrhea, unspecified: Secondary | ICD-10-CM | POA: Diagnosis not present

## 2018-01-27 DIAGNOSIS — R059 Cough, unspecified: Secondary | ICD-10-CM

## 2018-01-27 MED ORDER — NITROFURANTOIN MONOHYD MACRO 100 MG PO CAPS: 100.0000 mg | ORAL_CAPSULE | Freq: Two times a day (BID) | ORAL | 0 refills | Status: AC

## 2018-01-27 MED ORDER — LACTULOSE 10 GM/15ML PO SOLN: 10.0000 g | Freq: Every day | ORAL | 0 refills | Status: AC | PRN

## 2018-01-27 MED ORDER — FLUTICASONE PROPIONATE 50 MCG/ACT NA SUSP: 2.0000 | Freq: Every day | NASAL | 0 refills | Status: AC

## 2018-01-27 MED ORDER — GUAIFENESIN-DM 100-10 MG/5ML PO SYRP: 10.0000 mL | ORAL_SOLUTION | Freq: Three times a day (TID) | ORAL | 0 refills | Status: AC | PRN

## 2018-01-27 NOTE — Progress Notes (Signed)
Subjective:  Patient ID: Jamie [name], female    DOB: 1956-01-30  Age: 62 y.o. MRN: [id number]  CC: Sore Throat (sore throat,couging at times,headache,diarrhea,ears stop up/< 1 wks. )  Sore Throat   This is a new problem. The current episode started today. The problem has been unchanged. There has been no fever. Associated symptoms include congestion, coughing, diarrhea and ear pain. Pertinent negatives include no abdominal pain, drooling, ear discharge, headaches, hoarse voice, plugged ear sensation, neck pain, shortness of breath, stridor, swollen glands or trouble swallowing. She has had no exposure to strep or mono. She has tried nothing for the symptoms.  Diarrhea   This is a new problem. The current episode started yesterday. The problem occurs less than 2 times per day. The problem has been unchanged. The stool consistency is described as watery. The patient states that diarrhea does not awaken her from sleep. Associated symptoms include coughing and weight loss. Pertinent negatives include no abdominal pain, bloating, chills, headaches, increased  flatus, myalgias, sweats or URI. There are no known risk factors. She has tried nothing for the symptoms.    accompanied by mother.  She is unable to tell me her last normal BM. States diarrhea was reported by caregiver.  Caregiver also reported foul odor urine. Patient is incontinent of stool and urine, hence unable to discriminate any change in urination.  Outpatient Medications Prior to Visit  Medication Sig Dispense Refill  . levothyroxine (SYNTHROID, LEVOTHROID) 100 MCG tablet TAKE ONE TABLET BY MOUTH ONCE DAILY (Patient taking differently: TAKE ONE TABLET BY MOUTH EVERY EVENING) 90 tablet 3  . Multiple Vitamin (MULTIVITAMIN WITH MINERALS) TABS tablet Take 1 tablet by mouth daily.    Marland Kitchen trimethoprim (TRIMPEX) 100 MG tablet TAKE 1 TABLET BY MOUTH DAILY 30 tablet 3  . carbidopa-levodopa (SINEMET IR) 25-100 MG tablet Take 1 tablet by mouth 3  (three) times daily. Reported on 06/25/2016 90 tablet 2  . FLUoxetine (PROZAC) 20 MG capsule Take 2 capsules (40 mg total) by mouth daily. (Patient not taking: Reported on 10/25/2017) 180 capsule 1  . gabapentin (NEURONTIN) 300 MG capsule Take 1 capsule (300 mg total) by mouth 3 (three) times daily. (Patient not taking: Reported on 10/25/2017) 270 capsule 1   No facility-administered medications prior to visit.     ROS See HPI  Objective:  BP 110/76   Pulse 87   Temp (!) 97.5 F (36.4 C)   Ht 5\' 4"  (1.626 m)   Wt 109 lb (49.4 kg)   SpO2 98%   BMI 18.71 kg/m   BP Readings from Last 3 Encounters:  01/27/18 110/76  10/25/17 110/78  10/17/17 134/76    Wt Readings from Last 3 Encounters:  01/27/18 109 lb (49.4 kg)  10/25/17 113 lb (51.3 kg)  10/16/17 120 lb (54.4 kg)    Physical Exam  Constitutional: She is oriented to person, place, and time. No distress.  Legally blind. Accompanied by mother. Ambulates with walker.  Cardiovascular: Normal rate and regular rhythm.  Pulmonary/Chest: Effort normal and breath sounds normal. No respiratory distress.  Abdominal: Soft. Bowel sounds are normal. She exhibits no distension. There is no tenderness. There is no guarding.  Neurological: She is alert and oriented to person, place, and time.  Vitals reviewed.   Lab Results  Component Value Date   WBC 10.7 (H) 10/16/2017   HGB 11.2 (L) 10/16/2017   HCT 30.9 (L) 10/16/2017   PLT 325 10/16/2017   GLUCOSE 141 (H) 10/16/2017  CHOL 211 (H) 12/12/2016   TRIG 149.0 12/12/2016   HDL 60.90 12/12/2016   LDLCALC 121 (H) 12/12/2016   ALT 7 (L) 10/16/2017   AST 25 10/16/2017   NA 140 10/16/2017   K 3.9 10/16/2017   CL 107 10/16/2017   CREATININE 1.01 (H) 10/16/2017   BUN 24 (H) 10/16/2017   CO2 23 10/16/2017   TSH 0.56 06/12/2017    Ct Head Wo Contrast  Result Date: 10/17/2017 CLINICAL DATA:  Fall getting up from toilet. EXAM: CT HEAD WITHOUT CONTRAST CT CERVICAL SPINE WITHOUT  CONTRAST TECHNIQUE: Multidetector CT imaging of the head and cervical spine was performed following the standard protocol without intravenous contrast. Multiplanar CT image reconstructions of the cervical spine were also generated. COMPARISON:  Head CT 05/20/2015, cervical spine MRI 05/20/2015 FINDINGS: CT HEAD FINDINGS Brain: No intracranial hemorrhage. Stable diffuse ventricular dilatation, prominent involving the fourth ventricle. Stable marked cerebellar atrophy. Periventricular white matter changes and small white matter lesion in the right frontal lobe are also unchanged from prior exam. No subdural collection. No midline shift or mass effect. Vascular: Minimal atherosclerosis of skullbase vasculature. No hyperdense vessel. Skull: No fracture or focal lesion. Thinning of the right frontoparietal calvarium is unchanged. Sinuses/Orbits: Paranasal sinuses and mastoid air cells are clear. The visualized orbits are unremarkable. Frontal sinuses are hypo pneumatized, incidental. Unchanged small mucous retention cyst in the right maxillary sinus. Other: None. CT CERVICAL SPINE FINDINGS Alignment: Mild reversal of normal lordosis, similar to prior MRI. No traumatic subluxation. Skull base and vertebrae: None fusion posterior arch of C1, normal variant. No acute fracture. Vertebral body heights are maintained. The dens and skull base are intact. Soft tissues and spinal canal: No prevertebral fluid or swelling. No visible canal hematoma. Disc levels:  Mild disc space narrowing at C4-C5 and C5-C6. Upper chest: Negative. Other: Carotid calcifications. IMPRESSION: 1. No acute intracranial abnormality. No skull fracture. Similar appearance of brain from 2016 with ventricular prominence, cerebellar atrophy and periventricular white matter changes. 2. 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 Electronically Signed   By: Jeb Levering M.D.   On: 10/17/2017 00:06   Ct Cervical  Spine Wo Contrast  Result Date: 10/17/2017 CLINICAL DATA:  Fall getting up from toilet. EXAM: CT HEAD WITHOUT CONTRAST CT CERVICAL SPINE WITHOUT CONTRAST TECHNIQUE: Multidetector CT imaging of the head and cervical spine was performed following the standard protocol without intravenous contrast. Multiplanar CT image reconstructions of the cervical spine were also generated. COMPARISON:  Head CT 05/20/2015, cervical spine MRI 05/20/2015 FINDINGS: CT HEAD FINDINGS Brain: No intracranial hemorrhage. Stable diffuse ventricular dilatation, prominent involving the fourth ventricle. Stable marked cerebellar atrophy. Periventricular white matter changes and small white matter lesion in the right frontal lobe are also unchanged from prior exam. No subdural collection. No midline shift or mass effect. Vascular: Minimal atherosclerosis of skullbase vasculature. No hyperdense vessel. Skull: No fracture or focal lesion. Thinning of the right frontoparietal calvarium is unchanged. Sinuses/Orbits: Paranasal sinuses and mastoid air cells are clear. The visualized orbits are unremarkable. Frontal sinuses are hypo pneumatized, incidental. Unchanged small mucous retention cyst in the right maxillary sinus. Other: None. CT CERVICAL SPINE FINDINGS Alignment: Mild reversal of normal lordosis, similar to prior MRI. No traumatic subluxation. Skull base and vertebrae: None fusion posterior arch of C1, normal variant. No acute fracture. Vertebral body heights are maintained. The dens and skull base are intact. Soft tissues and spinal canal: No prevertebral fluid or swelling. No visible canal  hematoma. Disc levels:  Mild disc space narrowing at C4-C5 and C5-C6. Upper chest: Negative. Other: Carotid calcifications. IMPRESSION: 1. No acute intracranial abnormality. No skull fracture. Similar appearance of brain from 2016 with ventricular prominence, cerebellar atrophy and periventricular white matter changes. 2. Degenerative disc disease in  the midcervical spine, no acute fracture or subluxation. Electronically Signed   By: Jeb Levering M.D.   On: 10/17/2017 00:06    Assessment & Plan:   Shaterria was seen today for sore throat.  Diagnoses and all orders for this visit:  Constipation, unspecified constipation type -     DG Abd 2 Views; Future -     DG Abd 2 Views -     lactulose (CHRONULAC) 10 GM/15ML solution; Take 15 mLs (10 g total) by mouth daily as needed.  Eustachian tube dysfunction, bilateral -     fluticasone (FLONASE) 50 MCG/ACT nasal spray; Place 2 sprays into both nostrils daily.  Cough -     guaiFENesin-dextromethorphan (ROBITUSSIN DM) 100-10 MG/5ML syrup; Take 10 mLs by mouth every 8 (eight) hours as needed for cough.  Abnormal urine odor -     nitrofurantoin, macrocrystal-monohydrate, (MACROBID) 100 MG capsule; Take 1 capsule (100 mg total) by mouth 2 (two) times daily for 7 days.   I am having [name] start on fluticasone, guaiFENesin-dextromethorphan, nitrofurantoin (macrocrystal-monohydrate), and lactulose. I am also having her maintain her multivitamin with minerals, levothyroxine, gabapentin, carbidopa-levodopa, FLUoxetine, and trimethoprim.  Meds ordered this encounter  Medications  . fluticasone (FLONASE) 50 MCG/ACT nasal spray    Sig: Place 2 sprays into both nostrils daily.    Dispense:  16 g    Refill:  0    Order Specific Question:   Supervising Provider    Answer:   Lucille Passy [[id number]]  . guaiFENesin-dextromethorphan (ROBITUSSIN DM) 100-10 MG/5ML syrup    Sig: Take 10 mLs by mouth every 8 (eight) hours as needed for cough.    Dispense:  118 mL    Refill:  0    Order Specific Question:   Supervising Provider    Answer:   Lucille Passy [[id number]]  . nitrofurantoin, macrocrystal-monohydrate, (MACROBID) 100 MG capsule    Sig: Take 1 capsule (100 mg total) by mouth 2 (two) times daily for 7 days.    Dispense:  14 capsule    Refill:  0    Order Specific Question:   Supervising Provider     Answer:   Lucille Passy [[id number]]  . lactulose (CHRONULAC) 10 GM/15ML solution    Sig: Take 15 mLs (10 g total) by mouth daily as needed.    Dispense:  240 mL    Refill:  0    Order Specific Question:   Supervising Provider    Answer:   Lucille Passy [[id number]]    Follow-up: No Follow-up on file.  Wilfred Lacy, NP

## 2018-01-27 NOTE — Patient Instructions (Addendum)
Push oral hydration. Eat small frequent meals.  F/up with pcp in 1week if no improvement.  Unable to obtain urine sample today. Treated with oral abx based of her susceptibility to UTI.  Weight loss noted during office visit. Advised to try Boost three times a day to supplement nutrient. Mother and reports lack of appetite to be chronic.  ABD x-ray indicates constipation. Lactulose sent. Encourage adequate oral hydration.  Diarrhea, Adult Diarrhea is frequent loose and watery bowel movements. Diarrhea can make you feel weak and cause you to become dehydrated. Dehydration can make you tired and thirsty, cause you to have a dry mouth, and decrease how often you urinate. Diarrhea typically lasts 2-3 days. However, it can last longer if it is a sign of something more serious. It is important to treat your diarrhea as told by your health care provider. Follow these instructions at home: Eating and drinking  Follow these recommendations as told by your health care provider:  Take an oral rehydration solution (ORS). This is a drink that is sold at pharmacies and retail stores.  Drink clear fluids, such as water, ice chips, diluted fruit juice, and low-calorie sports drinks.  Eat bland, easy-to-digest foods in small amounts as you are able. These foods include bananas, applesauce, rice, lean meats, toast, and crackers.  Avoid drinking fluids that contain a lot of sugar or caffeine, such as energy drinks, sports drinks, and soda.  Avoid alcohol.  Avoid spicy or fatty foods.  General instructions  Drink enough fluid to keep your urine clear or pale yellow.  Wash your hands often. If soap and water are not available, use hand sanitizer.  Make sure that all people in your household wash their hands well and often.  Take over-the-counter and prescription medicines only as told by your health care provider.  Rest at home while you recover.  Watch your condition for any changes.  Take  a warm bath to relieve any burning or pain from frequent diarrhea episodes.  Keep all follow-up visits as told by your health care provider. This is important. Contact a health care provider if:  You have a fever.  Your diarrhea gets worse.  You have new symptoms.  You cannot keep fluids down.  You feel light-headed or dizzy.  You have a headache  You have muscle cramps. Get help right away if:  You have chest pain.  You feel extremely weak or you faint.  You have bloody or black stools or stools that look like tar.  You have severe pain, cramping, or bloating in your abdomen.  You have trouble breathing or you are breathing very quickly.  Your heart is beating very quickly.  Your skin feels cold and clammy.  You feel confused.  You have signs of dehydration, such as: ? Dark urine, very little urine, or no urine. ? Cracked lips. ? Dry mouth. ? Sunken eyes. ? Sleepiness. ? Weakness. This information is not intended to replace advice given to you by your health care provider. Make sure you discuss any questions you have with your health care provider. Document Released: 11/23/2002 Document Revised: 04/12/2016 Document Reviewed: 08/09/2015 Elsevier Interactive Patient Education  [employer].

## 2018-01-28 ENCOUNTER — Encounter: Payer: Self-pay | Admitting: Nurse Practitioner

## 2018-01-28 ENCOUNTER — Telehealth: Payer: Self-pay | Admitting: Internal Medicine

## 2018-01-28 DIAGNOSIS — S344XXS Injury of lumbosacral plexus, sequela: Secondary | ICD-10-CM | POA: Diagnosis not present

## 2018-01-28 DIAGNOSIS — G629 Polyneuropathy, unspecified: Secondary | ICD-10-CM | POA: Diagnosis not present

## 2018-01-28 NOTE — Telephone Encounter (Signed)
Pt given results per notes of Dr Thornton Papas on 01/27/18 Unable to document in result note due to result note not being routed to [facility]. Sister informed of rx for lactulose and pharmacy where med is located. IMPRESSION: Increased stool throughout colon and rectum.   Electronically Signed   By: Lavonia Dana M.D.   On: 01/27/2018 16:38

## 2018-01-29 ENCOUNTER — Encounter: Payer: Self-pay | Admitting: Nurse Practitioner

## 2018-01-30 ENCOUNTER — Other Ambulatory Visit: Payer: Self-pay | Admitting: Internal Medicine

## 2018-01-30 ENCOUNTER — Telehealth: Payer: Self-pay | Admitting: Internal Medicine

## 2018-01-30 NOTE — Telephone Encounter (Signed)
Copied from [place] [id number]. Topic: Quick Communication - Rx Refill/Question >> Jan 30, 2018  9:14 AM Robina Ade, Helene Kelp D wrote: Medication:levothyroxine (SYNTHROID, LEVOTHROID) 100 MCG tablet Has the patient contacted their pharmacy? Yes (Agent: If no, request that the patient contact the pharmacy for the refill.) Preferred Pharmacy (with phone number or street name): [place], [place] - [address] Agent: Please be advised that RX refills may take up to 3 business days. We ask that you follow-up with your pharmacy.

## 2018-01-31 DIAGNOSIS — G629 Polyneuropathy, unspecified: Secondary | ICD-10-CM | POA: Diagnosis not present

## 2018-01-31 DIAGNOSIS — S344XXS Injury of lumbosacral plexus, sequela: Secondary | ICD-10-CM | POA: Diagnosis not present

## 2018-02-14 DIAGNOSIS — G629 Polyneuropathy, unspecified: Secondary | ICD-10-CM | POA: Diagnosis not present

## 2018-02-14 DIAGNOSIS — S344XXS Injury of lumbosacral plexus, sequela: Secondary | ICD-10-CM | POA: Diagnosis not present

## 2018-02-22 IMAGING — CT CT HEAD W/O CM
5 of 8 series · 17 of 47 positions shown, 18 images · non-contrast
Comparison: Head CT 05/20/2015, cervical spine MRI 05/20/2015

CLINICAL DATA: Fall getting up from toilet.

EXAM:
CT HEAD WITHOUT CONTRAST
CT CERVICAL SPINE WITHOUT CONTRAST
TECHNIQUE: Multidetector CT imaging of the head and cervical spine was
performed following the standard protocol without intravenous
contrast. Multiplanar CT image reconstructions of the cervical spine
were also generated.

[Series 4: head without · axial · non-contrast · 0.42mm/px · z∈[-63,-8]mm · 2 of 33 slices shown, 3 images]
[im 11/33  brain]
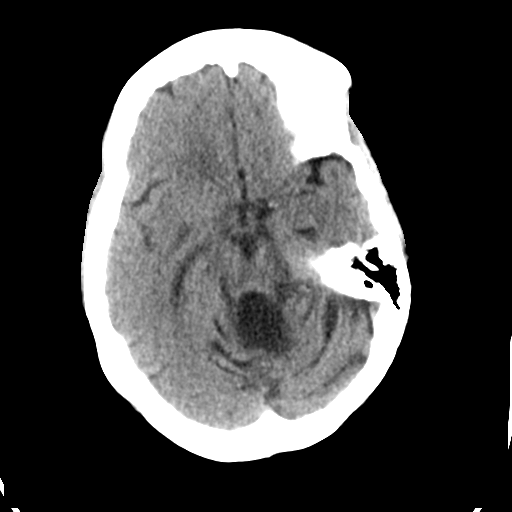
[im 11/33  bone]
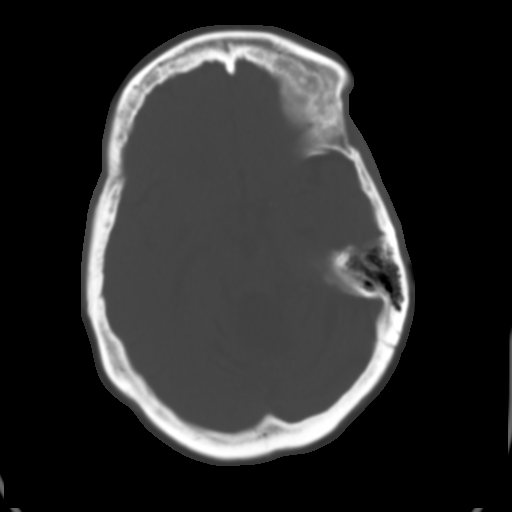
[im 22/33  brain]
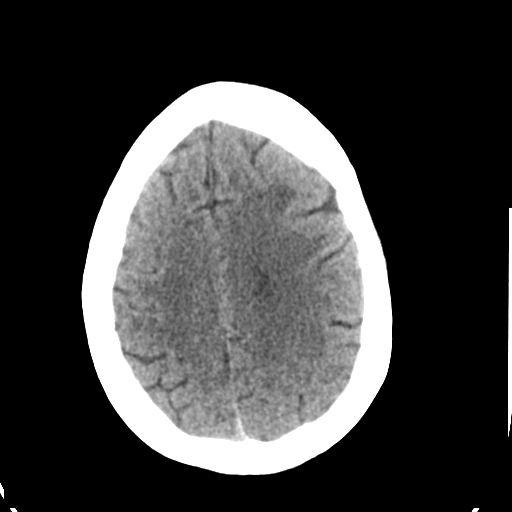

[Series 5: head bone · axial · 0.42mm/px · z∈[-93,+27]mm · 7 of 81 slices shown]
[im 11/81  bone]
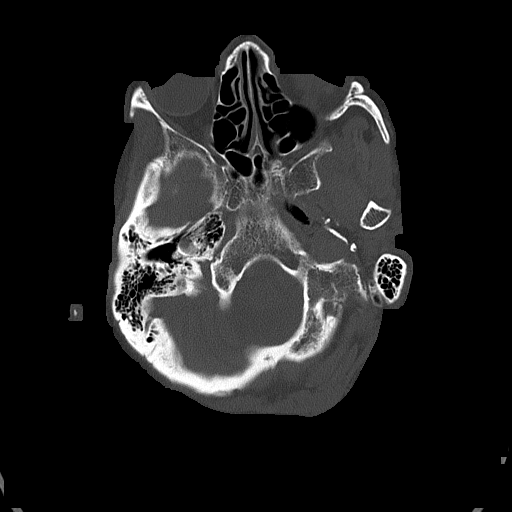
[im 21/81  bone]
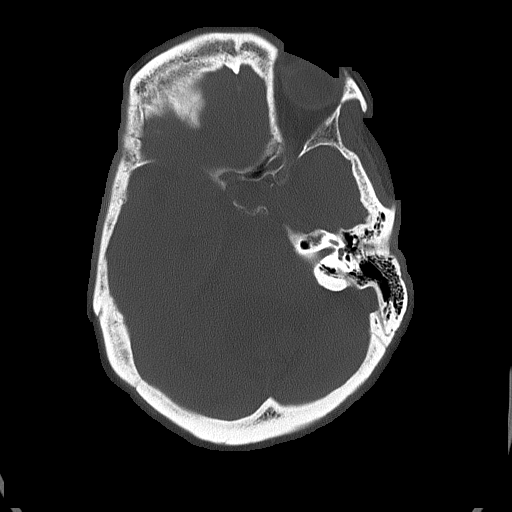
[im 31/81  bone]
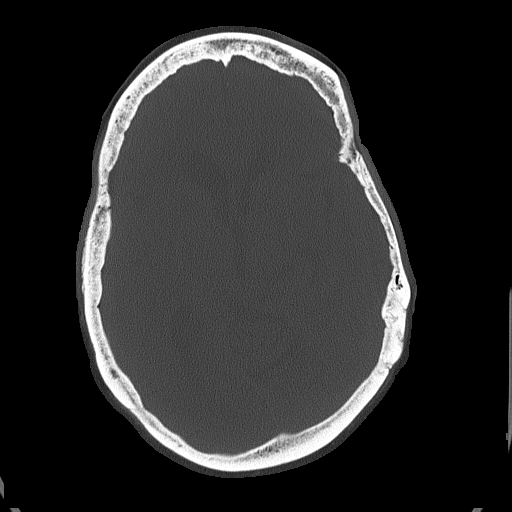
[im 41/81  bone]
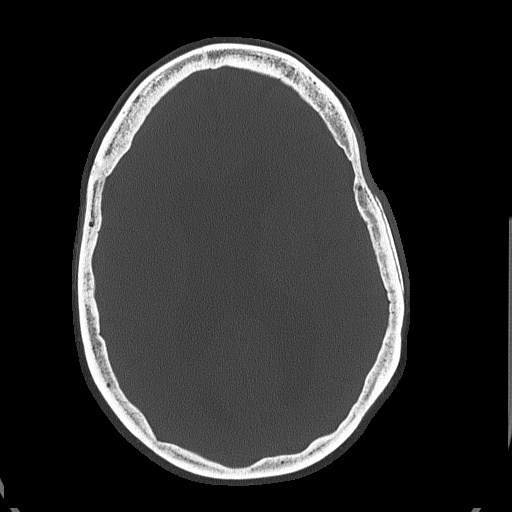
[im 51/81  bone]
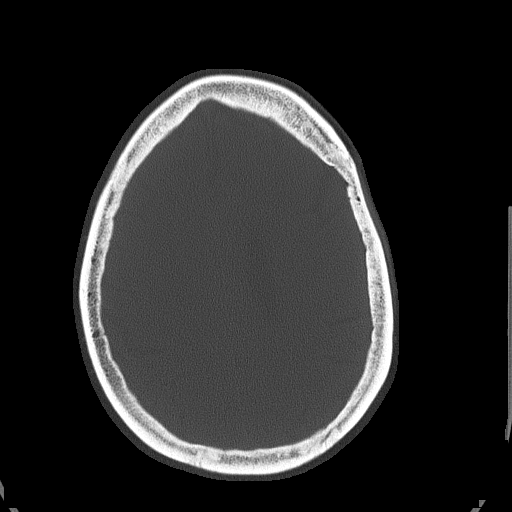
[im 61/81  bone]
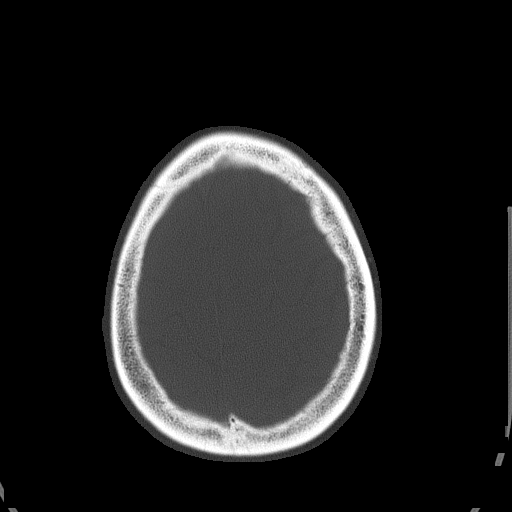
[im 71/81  bone]
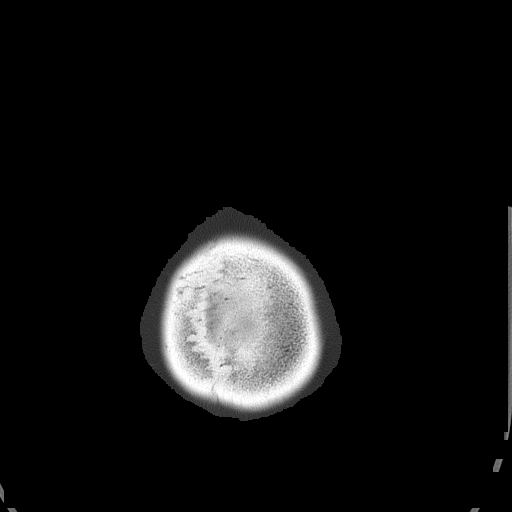

[Series 6: head without cor · coronal · non-contrast · 0.33mm/px · 3 of 67 slices shown]
[im 19/67  brain]
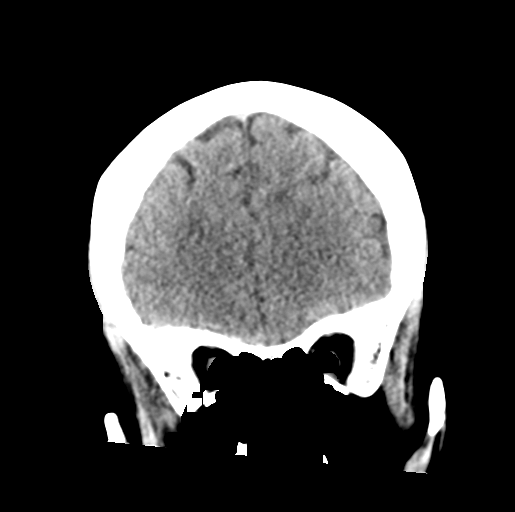
[im 29/67  brain]
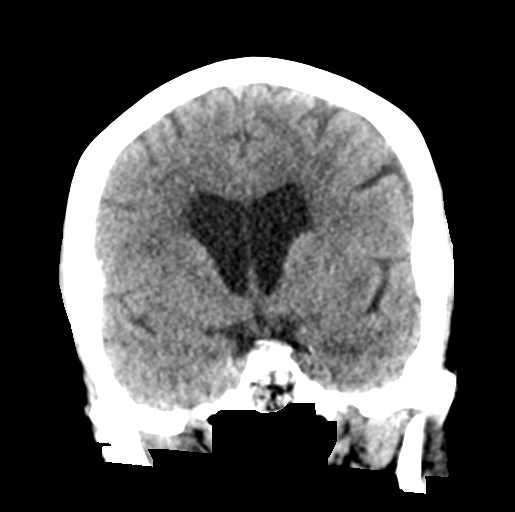
[im 38/67  brain]
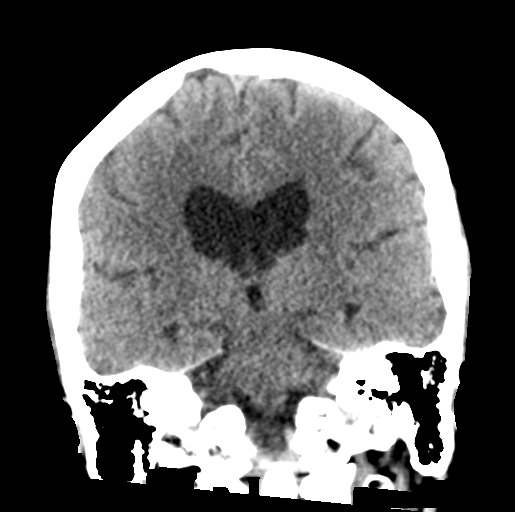

[Series 7: head without sag · sagittal · non-contrast · 0.32mm/px · 1 of 59 slices shown]
[im 30/59  brain]
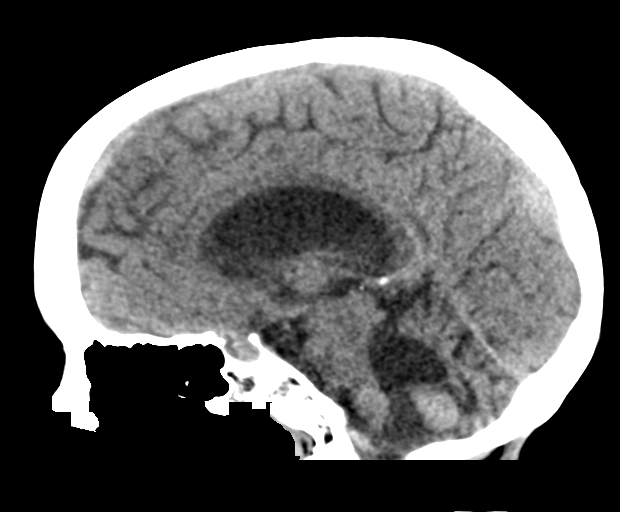

[Series 8: c_spine 2.0 st · axial · 0.31mm/px · z∈[-232,-170]mm · 4 of 73 slices shown]
[im 11/73  brain]
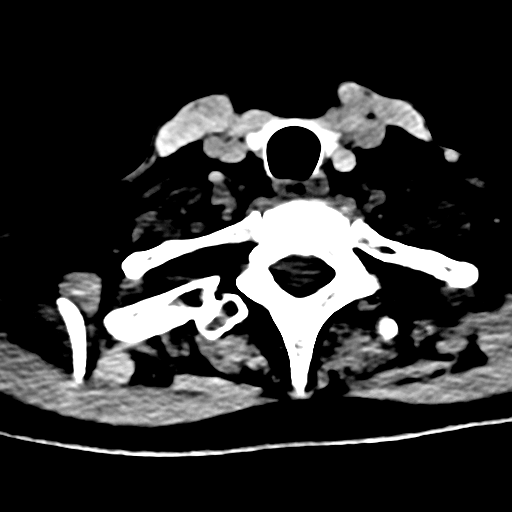
[im 21/73  brain]
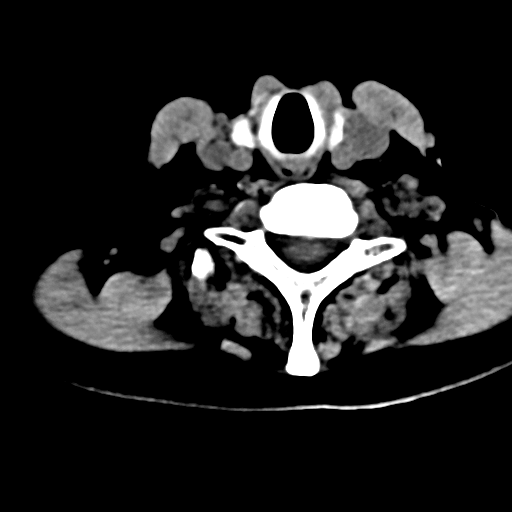
[im 31/73  brain]
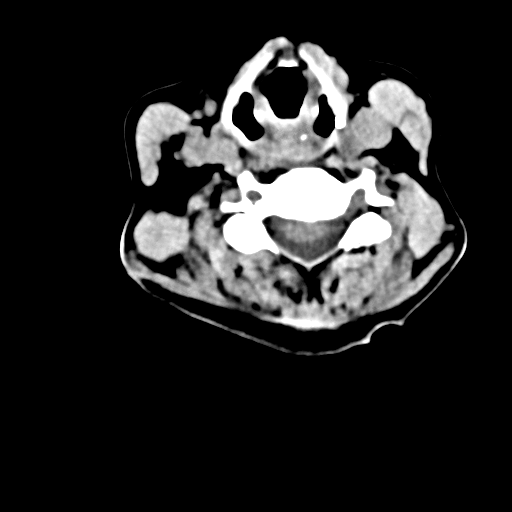
[im 42/73  brain]
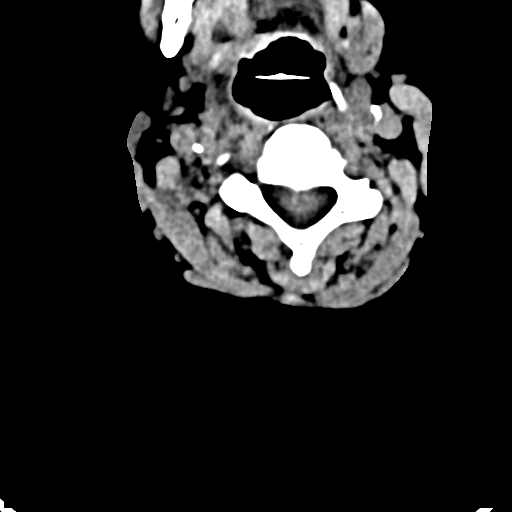

[17 of 47 positions shown; findings below may reference images not displayed]

FINDINGS: CT HEAD FINDINGS

Brain: No intracranial hemorrhage. Stable diffuse ventricular
dilatation, prominent involving the fourth ventricle. Stable marked
cerebellar atrophy. Periventricular white matter changes and small
white matter lesion in the right frontal lobe are also unchanged
from prior exam. No subdural collection. No midline shift or mass
effect.

Vascular: Minimal atherosclerosis of skullbase vasculature. No
hyperdense vessel.

Skull: No fracture or focal lesion. Thinning of the right
frontoparietal calvarium is unchanged.

Sinuses/Orbits: Paranasal sinuses and mastoid air cells are clear.
The visualized orbits are unremarkable. Frontal sinuses are hypo
pneumatized, incidental. Unchanged small mucous retention cyst in
the right maxillary sinus.

Other: None.

CT CERVICAL SPINE FINDINGS

Alignment: Mild reversal of normal lordosis, similar to prior MRI.
No traumatic subluxation.

Skull base and vertebrae: None fusion posterior arch of C1, normal
variant. No acute fracture. Vertebral body heights are maintained.
The dens and skull base are intact.

Soft tissues and spinal canal: No prevertebral fluid or swelling. No
visible canal hematoma.

Disc levels:  Mild disc space narrowing at C4-C5 and C5-C6.

Upper chest: Negative.

Other: Carotid calcifications.
IMPRESSION: 1. No acute intracranial abnormality. No skull fracture. Similar
appearance of brain from 4906 with ventricular prominence,
cerebellar atrophy and periventricular white matter changes.
2. Degenerative disc disease in the midcervical spine, no acute
fracture or subluxation.

## 2018-03-10 ENCOUNTER — Telehealth: Payer: Self-pay | Admitting: Internal Medicine

## 2018-03-10 MED ORDER — LEVOTHYROXINE SODIUM 100 MCG PO TABS: 100.0000 ug | ORAL_TABLET | Freq: Every day | ORAL | 0 refills | Status: AC

## 2018-03-10 NOTE — Telephone Encounter (Signed)
Called pt for her request for refill for Synthroid. Her mom, Dr. Mindi Junker stated that she was the one that requested the med and that with the last refill, the pharmacy only gave her 30 tablets and not 90 that was on the prescription.  Provider:  Lesly Rubenstein, MD Pharmacy : [facility] [phone] at [place] refill was  01/30/18 Please review.

## 2018-03-10 NOTE — Telephone Encounter (Signed)
Resent rx for 90 day script.Marland KitchenJohny [name]

## 2018-03-10 NOTE — Telephone Encounter (Signed)
Copied from [place]. Topic: Quick Communication - See Telephone Encounter >> Mar 10, 2018  8:13 AM Conception Chancy, NT wrote: CRM for notification. See Telephone encounter for: 03/10/18.  Patient mother is calling to request a rx refill on levothyroxine (SYNTHROID, LEVOTHROID) 100 MCG tablet  please advise.  [place], [place] - [address]  [address] [place] [address]  Phone: [phone] Fax: [phone]

## 2018-03-24 ENCOUNTER — Other Ambulatory Visit (INDEPENDENT_AMBULATORY_CARE_PROVIDER_SITE_OTHER): Payer: Medicare Other

## 2018-03-24 ENCOUNTER — Encounter: Payer: Self-pay | Admitting: Internal Medicine

## 2018-03-24 ENCOUNTER — Ambulatory Visit (INDEPENDENT_AMBULATORY_CARE_PROVIDER_SITE_OTHER): Payer: Medicare Other | Admitting: Internal Medicine

## 2018-03-24 VITALS — BP 110/70 | HR 84 | Temp 98.1°F | Ht 64.0 in | Wt 118.0 lb

## 2018-03-24 DIAGNOSIS — E785 Hyperlipidemia, unspecified: Secondary | ICD-10-CM | POA: Diagnosis not present

## 2018-03-24 DIAGNOSIS — F068 Other specified mental disorders due to known physiological condition: Secondary | ICD-10-CM | POA: Diagnosis not present

## 2018-03-24 DIAGNOSIS — N312 Flaccid neuropathic bladder, not elsewhere classified: Secondary | ICD-10-CM

## 2018-03-24 DIAGNOSIS — E039 Hypothyroidism, unspecified: Secondary | ICD-10-CM

## 2018-03-24 DIAGNOSIS — K592 Neurogenic bowel, not elsewhere classified: Secondary | ICD-10-CM

## 2018-03-24 DIAGNOSIS — R1310 Dysphagia, unspecified: Secondary | ICD-10-CM | POA: Diagnosis not present

## 2018-03-24 LAB — T4, FREE: FREE T4: 0.85 ng/dL (ref 0.60–1.60)

## 2018-03-24 LAB — CBC
HCT: 31.5 % — ABNORMAL LOW (ref 36.0–46.0)
HEMOGLOBIN: 10.9 g/dL — AB (ref 12.0–15.0)
MCHC: 34.7 g/dL (ref 30.0–36.0)
MCV: 81.6 fl (ref 78.0–100.0)
PLATELETS: 305 10*3/uL (ref 150.0–400.0)
RBC: 3.86 Mil/uL — ABNORMAL LOW (ref 3.87–5.11)
RDW: 15.5 % (ref 11.5–15.5)
WBC: 6.4 10*3/uL (ref 4.0–10.5)

## 2018-03-24 LAB — LIPID PANEL
CHOLESTEROL: 218 mg/dL — AB (ref 0–200)
HDL: 84.3 mg/dL (ref >39.00)
LDL CALC: 117 mg/dL — AB (ref 0–99)
NonHDL: 133.59
TRIGLYCERIDES: 85 mg/dL (ref 0.0–149.0)
Total CHOL/HDL Ratio: 3
VLDL: 17 mg/dL (ref 0.0–40.0)

## 2018-03-24 LAB — COMPREHENSIVE METABOLIC PANEL
ALBUMIN: 3.9 g/dL (ref 3.5–5.2)
ALK PHOS: 69 U/L (ref 39–117)
ALT: 17 U/L (ref 0–35)
AST: 22 U/L (ref 0–37)
BILIRUBIN TOTAL: 0.3 mg/dL (ref 0.2–1.2)
BUN: 19 mg/dL (ref 6–23)
CALCIUM: 9.2 mg/dL (ref 8.4–10.5)
CO2: 26 mEq/L (ref 19–32)
Chloride: 107 mEq/L (ref 96–112)
Creatinine, Ser: 0.83 mg/dL (ref 0.40–1.20)
GFR: 90.02 mL/min (ref >60.00)
Glucose, Bld: 89 mg/dL (ref 70–99)
Potassium: 4.4 mEq/L (ref 3.5–5.1)
Sodium: 142 mEq/L (ref 135–145)
TOTAL PROTEIN: 7.1 g/dL (ref 6.0–8.3)

## 2018-03-24 LAB — TSH: TSH: 6.68 u[IU]/mL — ABNORMAL HIGH (ref 0.35–4.50)

## 2018-03-24 NOTE — Assessment & Plan Note (Signed)
This is improved since last visit so will not work up further at this time.

## 2018-03-24 NOTE — Progress Notes (Signed)
   Subjective:    Patient ID: Jamie [name], female    DOB: Jun 11, 1956, 62 y.o.   MRN: [id number]  HPI The patient is a 62 YO female coming in with her mother (father passed away in Feb 01, 2023). She is still dealing with that loss but improving gradually. She is doing well overall. Urinary infections are better now that she is drinking cranberry juice regularly. She denies falls recently. No problems with medications. She denies new problems. Mild constipation and is working on improving water and exercise at home. At [facility] and they have exercise every morning but she goes 2-3 times per week.   Review of Systems  Constitutional: Negative.   HENT: Negative.   Eyes: Negative.   Respiratory: Negative for cough, chest tightness and shortness of breath.   Cardiovascular: Negative for chest pain, palpitations and leg swelling.  Gastrointestinal: Negative for abdominal distention, abdominal pain, constipation, diarrhea, nausea and vomiting.  Genitourinary:       Incontinence  Musculoskeletal: Negative.   Skin: Negative.   Neurological: Negative.       Objective:   Physical Exam  Constitutional: She is oriented to person, place, and time. She appears well-developed and well-nourished.  HENT:  Head: Normocephalic and atraumatic.  Eyes: EOM are normal.  Neck: Normal range of motion.  Cardiovascular: Normal rate and regular rhythm.  Pulmonary/Chest: Effort normal and breath sounds normal. No respiratory distress. She has no wheezes. She has no rales.  Abdominal: Soft. Bowel sounds are normal. She exhibits no distension. There is no tenderness. There is no rebound.  Musculoskeletal: She exhibits no edema.  Neurological: She is alert and oriented to person, place, and time. Coordination abnormal.  Walker to ambulate, slow gait.  Skin: Skin is warm and dry.  Psychiatric: She has a normal mood and affect.   Vitals:   03/24/18 0959  BP: 110/70  Pulse: 84  Temp: 98.1 F (36.7 C)    TempSrc: Oral  SpO2: 99%  Weight: 118 lb (53.5 kg)  Height: 5\' 4"  (1.626 m)      Assessment & Plan:

## 2018-03-24 NOTE — Patient Instructions (Signed)
We will check the blood work today and call you back about the results.    

## 2018-03-24 NOTE — Assessment & Plan Note (Signed)
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

## 2018-03-24 NOTE — Assessment & Plan Note (Signed)
Stable, due to nerve injury and not expected to recover.

## 2018-03-24 NOTE — Assessment & Plan Note (Signed)
Stable on prozac 40 mg daily and has done well overall with loss of father recently.

## 2018-03-24 NOTE — Assessment & Plan Note (Signed)
Checking TSH and free T4 and adjust synthroid 100 mcg daily as needed.  

## 2018-04-21 ENCOUNTER — Telehealth: Payer: Self-pay | Admitting: Internal Medicine

## 2018-04-21 ENCOUNTER — Other Ambulatory Visit: Payer: Self-pay

## 2018-04-21 MED ORDER — FLUOXETINE HCL 20 MG PO CAPS: 40.0000 mg | ORAL_CAPSULE | Freq: Every day | ORAL | 1 refills | Status: AC

## 2018-04-21 NOTE — Telephone Encounter (Signed)
Called patients mother back and stated they just wanted a refill, she was never off of it. Refill sent

## 2018-04-21 NOTE — Telephone Encounter (Signed)
Copied from [place] [id number]. Topic: Quick Communication - See Telephone Encounter >> Apr 21, 2018  8:23 AM Arletha Grippe wrote: CRM for notification. See Telephone encounter for: 04/21/18.  Mom called - she is asking for a call back from the nurse about pt's depression.  She says her mood is worsening and pt is not eating much and is lethargic the past few days.  Mom thinks that pt needs to go back on prozac.  Please call [phone]

## 2018-04-24 ENCOUNTER — Telehealth: Payer: Self-pay | Admitting: Internal Medicine

## 2018-04-24 NOTE — Telephone Encounter (Signed)
Patient's mother has dropped of a insured supplementary physician's statement from [employer].   I do not see where this has been completed by you before. I will place it in your box for you to look at. Please advise if you would like for me to complete, or if you will.   LOV: 03/24/18  Thank you.

## 2018-04-24 NOTE — Telephone Encounter (Signed)
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

## 2018-04-24 NOTE — Telephone Encounter (Signed)
I do not know what condition or accident or injury they are wanting this filled out for. We need more information to see if we can complete.

## 2018-04-25 NOTE — Telephone Encounter (Signed)
Called and LVM to inform patient Dr.Crawford had a few more questions about the form.   Questions: What kind of work did patient use to do? When was the last time she worked/ If ever?

## 2018-04-25 NOTE — Telephone Encounter (Signed)
Mother, Dr Edmonia James, calling and stated that she received a message that said more information was needed. 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 States she stopped her services in 2003. CB#: [phone]

## 2018-04-28 DIAGNOSIS — M79672 Pain in left foot: Secondary | ICD-10-CM | POA: Diagnosis not present

## 2018-04-28 DIAGNOSIS — M2041 Other hammer toe(s) (acquired), right foot: Secondary | ICD-10-CM | POA: Diagnosis not present

## 2018-04-28 DIAGNOSIS — L600 Ingrowing nail: Secondary | ICD-10-CM | POA: Diagnosis not present

## 2018-04-28 DIAGNOSIS — M79671 Pain in right foot: Secondary | ICD-10-CM | POA: Diagnosis not present

## 2018-04-28 DIAGNOSIS — Z0279 Encounter for issue of other medical certificate: Secondary | ICD-10-CM

## 2018-04-28 DIAGNOSIS — M2042 Other hammer toe(s) (acquired), left foot: Secondary | ICD-10-CM | POA: Diagnosis not present

## 2018-04-28 NOTE — Telephone Encounter (Signed)
Is this enough information, do you need to know anything else?

## 2018-04-28 NOTE — Telephone Encounter (Signed)
Form has been completed by Dr.Crawford, Copy sent to scan [facility]Charged for.   LVM to inform patient's mother it is ready to be picked up.

## 2018-05-01 ENCOUNTER — Ambulatory Visit (INDEPENDENT_AMBULATORY_CARE_PROVIDER_SITE_OTHER): Payer: Medicare Other | Admitting: Family

## 2018-05-01 ENCOUNTER — Encounter: Payer: Self-pay | Admitting: Family

## 2018-05-01 VITALS — BP 110/72 | HR 67 | Temp 97.6°F | Ht 64.0 in | Wt 109.1 lb

## 2018-05-01 DIAGNOSIS — L304 Erythema intertrigo: Secondary | ICD-10-CM

## 2018-05-01 MED ORDER — NYSTATIN-TRIAMCINOLONE 100000-0.1 UNIT/GM-% EX OINT: 1.0000 "application " | TOPICAL_OINTMENT | Freq: Two times a day (BID) | CUTANEOUS | 0 refills | Status: AC

## 2018-05-01 MED ORDER — SULFAMETHOXAZOLE-TRIMETHOPRIM 800-160 MG PO TABS: 1.0000 | ORAL_TABLET | Freq: Two times a day (BID) | ORAL | 0 refills | Status: AC

## 2018-05-01 MED ORDER — FLUCONAZOLE 100 MG PO TABS: 100.0000 mg | ORAL_TABLET | Freq: Once | ORAL | 0 refills | Status: AC

## 2018-05-01 NOTE — Progress Notes (Signed)
Jamie [name] is a 62 y.o. female with the following history as recorded in Epic[employer]:  Patient Active Problem List   Diagnosis Date Noted  . Right lumbar radiculopathy 05/10/2017  . Peripheral neuropathy 05/10/2017  . 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 Depression 12/12/2016  . Dysphagia 10/14/2016  . Lower extremity edema 05/09/2016  . Hypothyroidism 11/26/2015  . 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 Neurogenic bowel 05/24/2015  . 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 Gait instability   . 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 SNHL (sensorineural hearing loss) 08/21/2013  . Tinnitus 08/21/2013  . 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 Intertrigo 11/16/2011    Current Outpatient Medications  Medication Sig Dispense Refill  . FLUoxetine (PROZAC) 20 MG capsule Take 2 capsules (40 mg total) by mouth daily. 180 capsule 1  . fluticasone (FLONASE) 50 MCG/ACT nasal spray Place 2 sprays into both nostrils daily. 16 g 0  . gabapentin (NEURONTIN) 300 MG capsule Take 1 capsule (300 mg total) by mouth 3 (three) times daily. 270 capsule 1  . lactulose (CHRONULAC) 10 GM/15ML solution Take 15 mLs (10 g total) by mouth daily as needed. 240 mL 0  . levothyroxine (SYNTHROID, LEVOTHROID) 100 MCG tablet Take 1 tablet (100 mcg total) by mouth daily. 90 tablet 0  . Multiple Vitamin (MULTIVITAMIN WITH MINERALS) TABS tablet Take 1 tablet by mouth daily.    . carbidopa-levodopa (SINEMET IR) 25-100 MG tablet Take 1 tablet by mouth 3 (three) times daily. Reported on 06/25/2016 90 tablet 2  . guaiFENesin-dextromethorphan (ROBITUSSIN DM) 100-10 MG/5ML syrup Take 10 mLs by mouth every 8 (eight) hours as needed for cough. (Patient not taking: Reported on 03/24/2018) 118 mL 0  . nystatin cream (MYCOSTATIN) Apply 1 application topically 2 (two) times daily. 60 g 0  . nystatin-triamcinolone ointment (MYCOLOG) Apply 1 application topically 2 (two) times daily. 60 g 0  . sulfamethoxazole-trimethoprim  (BACTRIM DS,SEPTRA DS) 800-160 MG tablet Take 1 tablet by mouth 2 (two) times daily. 20 tablet 0  . triamcinolone cream (KENALOG) 0.1 % Apply 1 application topically 2 (two) times daily. 60 g 0  . trimethoprim (TRIMPEX) 100 MG tablet TAKE 1 TABLET BY MOUTH DAILY (Patient not taking: Reported on 05/01/2018) 30 tablet 3   No current facility-administered medications for this visit.     Allergies: Remicade [infliximab] and Penicillins  Past Medical History:  Diagnosis Date  . Depression   . Hypothyroidism   . Neurosarcoidosis in adult    followed at [facility]  . 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 Progressive gait disorder    Was treated for probable MS until diagnosed with Neurosarcoidosis about 10 years ago.   . Sacral fracture ([place])    2016 no surgery / incontinent wears brief  and numbness in pelvi area  . Thyroid disease     Past Surgical History:  Procedure Laterality Date  . CYSTOSCOPY WITH BIOPSY N/A 05/17/2017   Procedure: CYSTOSCOPY WITH BIOPSY;  Surgeon: Alexis Frock, MD;  Location: [facility];  Service: Urology;  Laterality: N/A;  . CYSTOSCOPY WITH FULGERATION N/A 05/17/2017   Procedure: CYSTOSCOPY BILATERAL RETROGRADE PYLOGRAM, BLADDER BIOPSY WITH FULGERATION;  Surgeon: Alexis Frock, MD;  Location: [facility];  Service: Urology;  Laterality: N/A;  . TONSILLECTOMY      Family History  Problem Relation Age of Onset  . Hypertension Father     Social History   Tobacco Use  . Smoking  status: Never Smoker  . Smokeless tobacco: Never Used  Substance Use Topics  . Alcohol use: No    Subjective:  Presents with her mother today; complaints of "rash" underneath both breasts; denies any pain; mother states that patient's caregiver first noticed in the past 24-48 hours; patient states that she uses a towel to get her skin dry when getting out of the shower;   Objective:  Vitals:   05/01/18 1516  BP: 110/72  Pulse: 67  Temp:  97.6 F (36.4 C)  TempSrc: Oral  SpO2: 97%  Weight: 109 lb 1.3 oz (49.5 kg)  Height: 5\' 4"  (1.626 m)    General: Well developed, well nourished, in no acute distress  Skin : Warm and dry. Extensive fungal infection beneath both breasts- concern for secondary bacterial infection Head: Normocephalic and atraumatic  Eyes: Sclera and conjunctiva clear; pupils round and reactive to light; extraocular movements intact  Ears: External normal; canals clear; tympanic membranes normal  Oropharynx: Pink, supple. No suspicious lesions  Neck: Supple without thyromegaly, adenopathy  Lungs: Respirations unlabored; clear to auscultation bilaterally without wheeze, rales, rhonchi  Neurologic: Alert and oriented; speech intact; face symmetrical; moves all extremities well; CNII-XII intact without focal deficit  Assessment:  1. Intertrigo     Plan:  Will treat with combination of Diflucan and Bactrim DS; Rx for Mycolog II to apply bid to affected area; stressed need of keeping area dry to prevent recurrent symptoms;  Reviewed preventive care needs- mother to check records for dates of colonoscopy/ pap smear/ vaccines; patient plans to get mammogram updated once breast rash clears.   No follow-ups on file.  No orders of the defined types were placed in this encounter.   Requested Prescriptions   Signed Prescriptions Disp Refills  . fluconazole (DIFLUCAN) 100 MG tablet 7 tablet 0    Sig: Take 1 tablet (100 mg total) by mouth once for 1 dose.  . nystatin-triamcinolone ointment (MYCOLOG) 60 g 0    Sig: Apply 1 application topically 2 (two) times daily.  Marland Kitchen sulfamethoxazole-trimethoprim (BACTRIM DS,SEPTRA DS) 800-160 MG tablet 20 tablet 0    Sig: Take 1 tablet by mouth 2 (two) times daily.  Marland Kitchen nystatin cream (MYCOSTATIN) 60 g 0    Sig: Apply 1 application topically 2 (two) times daily.  Marland Kitchen triamcinolone cream (KENALOG) 0.1 % 60 g 0    Sig: Apply 1 application topically 2 (two) times daily.

## 2018-05-01 NOTE — Patient Instructions (Signed)
Use Aquaphor on your eyelid area to help moisturize the screen;    Intertrigo Intertrigo is skin irritation (inflammation) that happens in warm, moist areas of the body. The irritation can cause a rash and make skin raw and itchy. The rash is usually pink or red. It happens mostly between folds of skin or where skin rubs together, such as:  Toes.  Armpits.  Groin.  Belly.  Breasts.  Buttocks.  This condition is not passed from person to person (is not contagious). Follow these instructions at home:  Keep the affected area clean and dry.  Do not scratch your skin.  Stay cool as much as possible. Use an air conditioner or fan, if you can.  Apply over-the-counter and prescription medicines only as told by your doctor.  If you were prescribed an antibiotic medicine, use it as told by your doctor. Do not stop using the antibiotic even if your condition starts to get better.  Keep all follow-up visits as told by your doctor. This is important. How is this prevented?  Stay at a healthy weight.  Keep your feet dry. This is very important if you have diabetes. Wear cotton or wool socks.  Take care of and protect the skin in your groin and butt area as told by your doctor.  Do not wear tight clothes. Wear clothes that: ? Are loose. ? Take away moisture from your body. ? Are made of cotton.  Wear a bra that gives good support, if needed.  Shower and dry yourself fully after being active.  Keep your blood sugar under control if you have diabetes. Contact a doctor if:  Your symptoms do not get better with treatment.  Your symptoms get worse or they spread.  You notice more redness and warmth.  You have a fever. This information is not intended to replace advice given to you by your health care provider. Make sure you discuss any questions you have with your health care provider. Document Released: 01/05/2011 Document Revised: 05/10/2016 Document Reviewed:  06/06/2015 Elsevier Interactive Patient Education  [employer].

## 2018-05-02 ENCOUNTER — Telehealth: Payer: Self-pay | Admitting: Internal Medicine

## 2018-05-02 MED ORDER — TRIAMCINOLONE ACETONIDE 0.1 % EX CREA: 1.0000 "application " | TOPICAL_CREAM | Freq: Two times a day (BID) | CUTANEOUS | 0 refills | Status: AC

## 2018-05-02 MED ORDER — NYSTATIN 100000 UNIT/GM EX CREA: 1.0000 "application " | TOPICAL_CREAM | Freq: Two times a day (BID) | CUTANEOUS | 0 refills | Status: AC

## 2018-05-02 NOTE — Telephone Encounter (Signed)
Copied from [place] [id number]. Topic: Quick Communication - See Telephone Encounter >> May 02, 2018  3:14 PM Synthia Innocent wrote: CRM for notification. See Telephone encounter for: 05/02/18. Patient mother calling, insurance will not cover nystatin-triamcinolone ointment. Please advise. States pharmacy has contacted [place].

## 2018-05-05 NOTE — Telephone Encounter (Signed)
Please advise. Thanks.  

## 2018-05-05 NOTE — Telephone Encounter (Signed)
Called pharmacy and had let them know switching the manufacturer for thyroid medication was okay

## 2018-05-05 NOTE — Telephone Encounter (Signed)
Hi [name],  Can you see if Dr. Sharlet Salina will fill Synthroid for [name]? We only saw her once but we are not managing her thyroid and Dr. Sharlet Salina is who originally prescribed meds to her.  Thanks

## 2018-05-05 NOTE — Telephone Encounter (Signed)
It should go to [place];

## 2018-05-05 NOTE — Telephone Encounter (Signed)
I sent in the 2 different prescriptions as requested on Friday; they need to check with their pharmacy.

## 2018-05-05 NOTE — Telephone Encounter (Signed)
Spoke with patient's mother and they were able to get the scripts you called in. She said however it was her Synthroid that they are requesting a refill on and was having trouble obtaining? Do you want to address this or have it go to Dr. Sharlet Salina?  Please advise  Thanks

## 2018-05-05 NOTE — Telephone Encounter (Signed)
Patient saw laura last please advise

## 2018-05-14 ENCOUNTER — Ambulatory Visit: Payer: Medicare Other | Admitting: Neurology

## 2018-05-16 ENCOUNTER — Telehealth: Payer: Self-pay | Admitting: Emergency Medicine

## 2018-05-16 NOTE — Telephone Encounter (Signed)
Called patient to schedule AWV. Patient declined at this time. 

## 2018-06-05 IMAGING — DX DG ABDOMEN 2V
2 series · 2 of 2 positions shown · non-contrast
Comparison: None

CLINICAL DATA: Two episodes of diarrhea, question of
constipation/impaction, history of incontinence since a sacral
fracture

EXAM:
ABDOMEN - 2 VIEW

[abdomen supine ap]
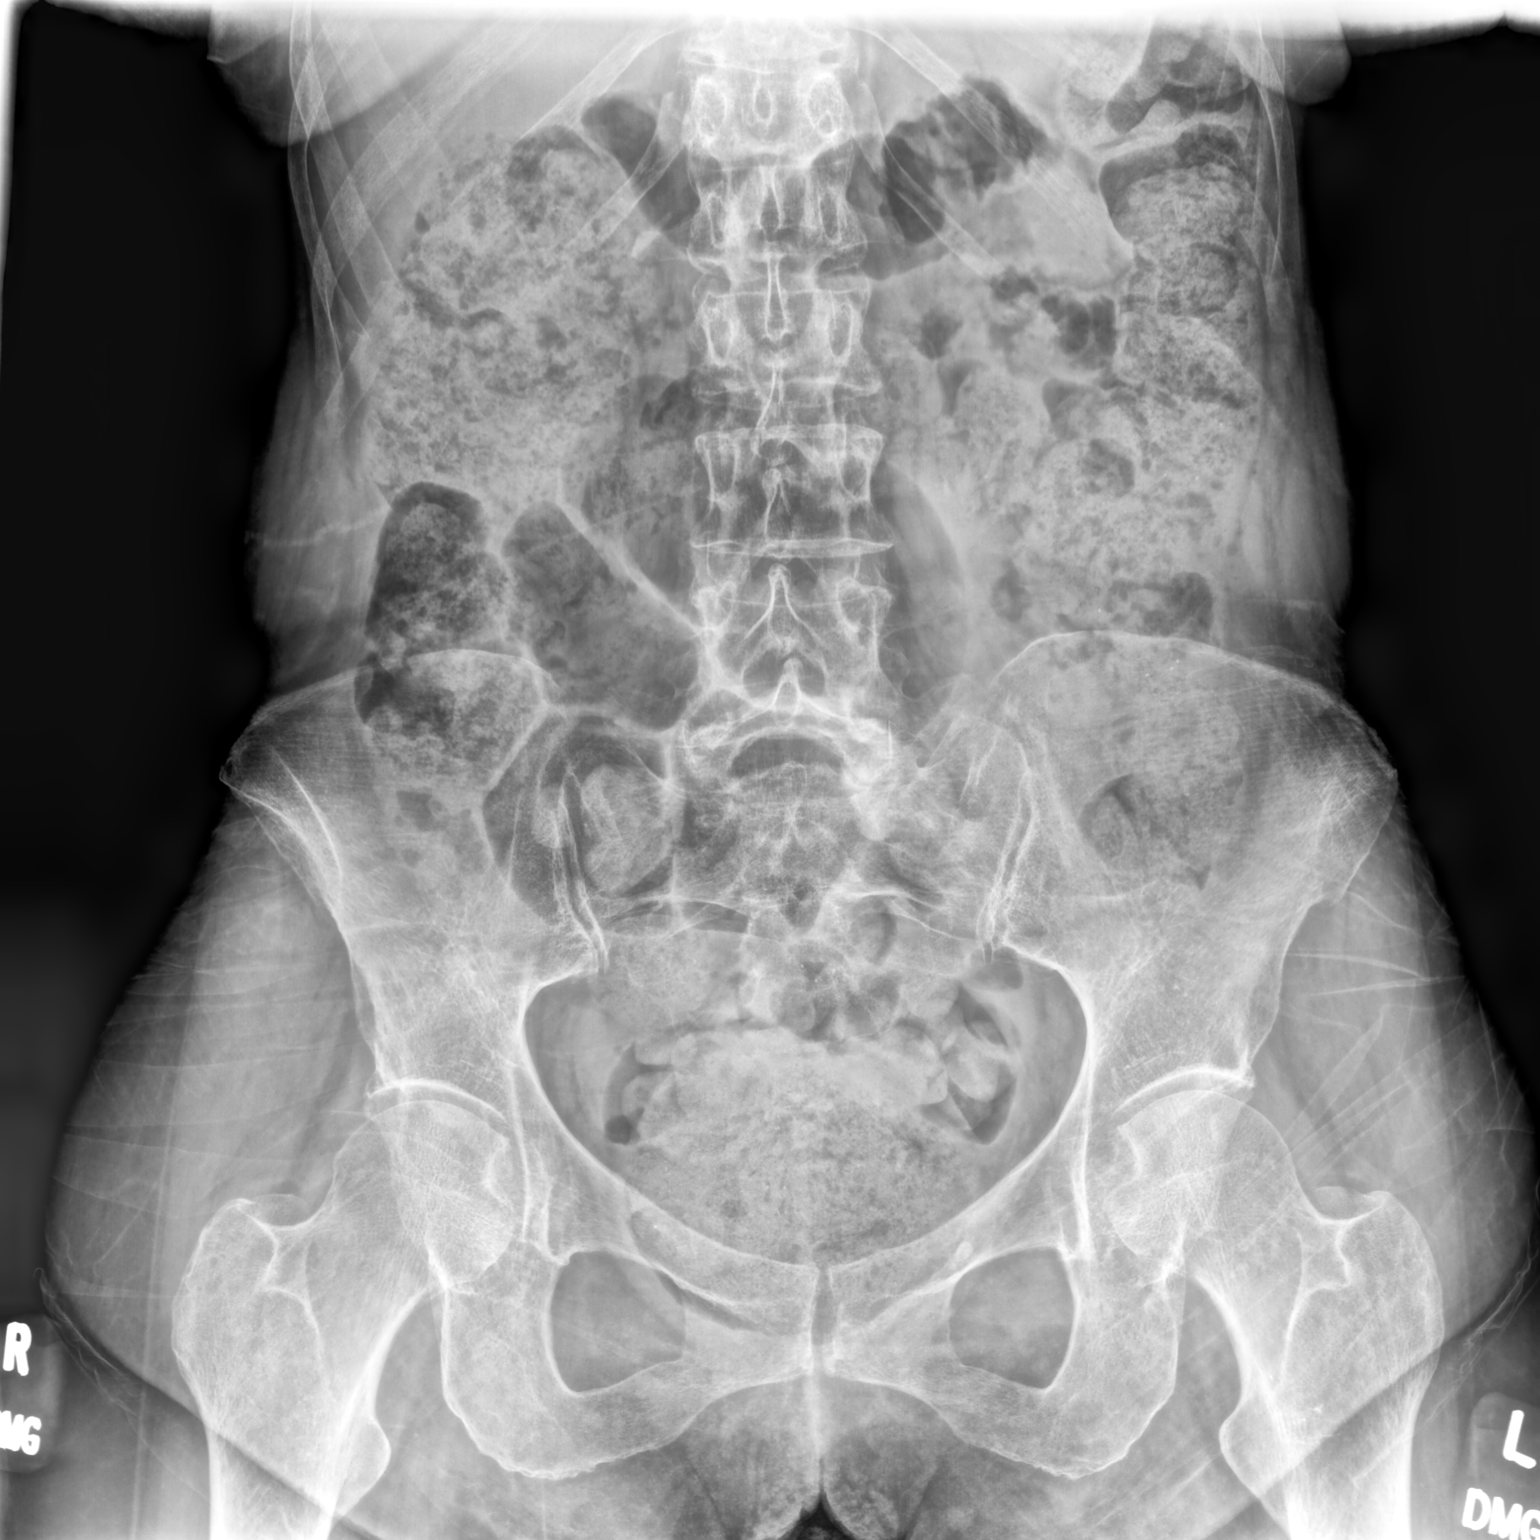

[abdomen standing ap]
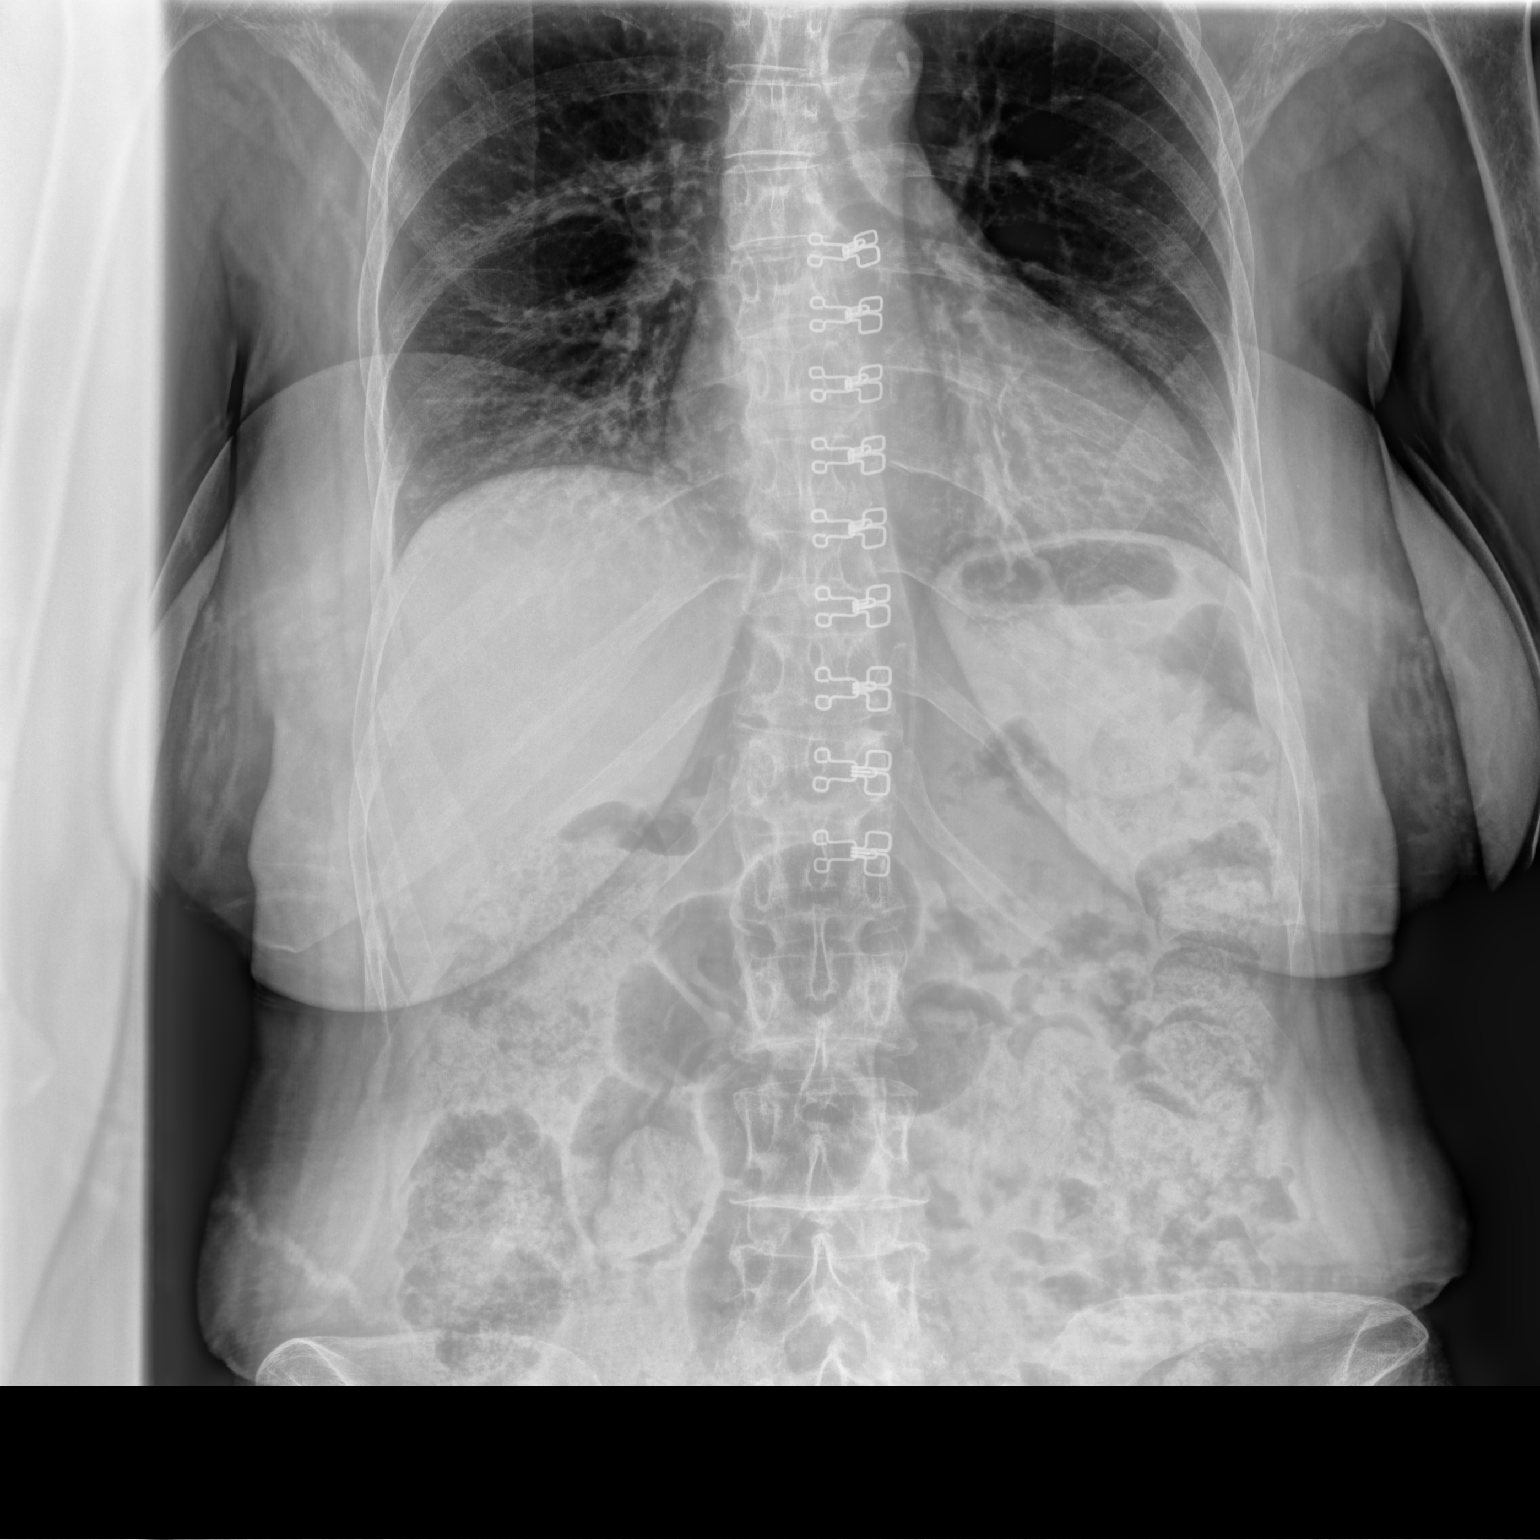

[2 of 2 positions shown; findings below may reference images not displayed]

FINDINGS: Increased stool throughout colon and rectum.

No small bowel dilatation or bowel wall thickening.

No free air.

Subsegmental atelectasis at medial LEFT lung base.

Remaining lung bases clear.

Bones diffusely demineralized.
IMPRESSION: Increased stool throughout colon and rectum.

## 2018-09-02 DIAGNOSIS — Z789 Other specified health status: Secondary | ICD-10-CM | POA: Diagnosis not present

## 2018-09-02 DIAGNOSIS — N3942 Incontinence without sensory awareness: Secondary | ICD-10-CM | POA: Diagnosis not present

## 2018-09-02 DIAGNOSIS — D8689 Sarcoidosis of other sites: Secondary | ICD-10-CM | POA: Diagnosis not present

## 2018-09-02 DIAGNOSIS — R5383 Other fatigue: Secondary | ICD-10-CM | POA: Diagnosis not present

## 2018-09-02 DIAGNOSIS — Z13220 Encounter for screening for lipoid disorders: Secondary | ICD-10-CM | POA: Diagnosis not present

## 2018-09-02 DIAGNOSIS — E538 Deficiency of other specified B group vitamins: Secondary | ICD-10-CM | POA: Diagnosis not present

## 2018-09-02 DIAGNOSIS — R350 Frequency of micturition: Secondary | ICD-10-CM | POA: Diagnosis not present

## 2018-09-02 DIAGNOSIS — H541225 Low vision right eye category 2, blindness left eye category 5: Secondary | ICD-10-CM | POA: Diagnosis not present

## 2018-09-02 DIAGNOSIS — R262 Difficulty in walking, not elsewhere classified: Secondary | ICD-10-CM | POA: Diagnosis not present

## 2018-09-02 DIAGNOSIS — E559 Vitamin D deficiency, unspecified: Secondary | ICD-10-CM | POA: Diagnosis not present

## 2018-09-05 DIAGNOSIS — G319 Degenerative disease of nervous system, unspecified: Secondary | ICD-10-CM | POA: Diagnosis not present

## 2018-09-05 DIAGNOSIS — H8110 Benign paroxysmal vertigo, unspecified ear: Secondary | ICD-10-CM | POA: Diagnosis not present

## 2018-09-05 DIAGNOSIS — D333 Benign neoplasm of cranial nerves: Secondary | ICD-10-CM | POA: Diagnosis not present

## 2018-09-05 DIAGNOSIS — H47213 Primary optic atrophy, bilateral: Secondary | ICD-10-CM | POA: Diagnosis not present

## 2018-09-05 DIAGNOSIS — Z880 Allergy status to penicillin: Secondary | ICD-10-CM | POA: Diagnosis not present

## 2018-09-05 DIAGNOSIS — I491 Atrial premature depolarization: Secondary | ICD-10-CM | POA: Diagnosis not present

## 2018-09-05 DIAGNOSIS — H538 Other visual disturbances: Secondary | ICD-10-CM | POA: Diagnosis not present

## 2018-09-05 DIAGNOSIS — R42 Dizziness and giddiness: Secondary | ICD-10-CM | POA: Diagnosis not present

## 2018-09-05 DIAGNOSIS — R9431 Abnormal electrocardiogram [ECG] [EKG]: Secondary | ICD-10-CM | POA: Diagnosis not present

## 2018-09-05 DIAGNOSIS — G959 Disease of spinal cord, unspecified: Secondary | ICD-10-CM | POA: Diagnosis not present

## 2018-09-05 DIAGNOSIS — D869 Sarcoidosis, unspecified: Secondary | ICD-10-CM | POA: Diagnosis not present

## 2018-09-05 DIAGNOSIS — H44001 Unspecified purulent endophthalmitis, right eye: Secondary | ICD-10-CM | POA: Diagnosis not present

## 2018-09-05 DIAGNOSIS — I4519 Other right bundle-branch block: Secondary | ICD-10-CM | POA: Diagnosis not present

## 2018-09-06 DIAGNOSIS — R42 Dizziness and giddiness: Secondary | ICD-10-CM | POA: Diagnosis not present

## 2018-09-06 DIAGNOSIS — R9402 Abnormal brain scan: Secondary | ICD-10-CM | POA: Diagnosis not present

## 2018-09-06 DIAGNOSIS — D869 Sarcoidosis, unspecified: Secondary | ICD-10-CM | POA: Diagnosis not present

## 2018-09-06 DIAGNOSIS — H47213 Primary optic atrophy, bilateral: Secondary | ICD-10-CM | POA: Diagnosis not present

## 2018-09-07 DIAGNOSIS — R42 Dizziness and giddiness: Secondary | ICD-10-CM | POA: Diagnosis not present

## 2018-09-07 DIAGNOSIS — H47213 Primary optic atrophy, bilateral: Secondary | ICD-10-CM | POA: Diagnosis not present

## 2018-09-07 DIAGNOSIS — D869 Sarcoidosis, unspecified: Secondary | ICD-10-CM | POA: Diagnosis not present

## 2018-09-07 DIAGNOSIS — R9402 Abnormal brain scan: Secondary | ICD-10-CM | POA: Diagnosis not present

## 2018-09-08 DIAGNOSIS — R911 Solitary pulmonary nodule: Secondary | ICD-10-CM | POA: Diagnosis present

## 2018-09-08 DIAGNOSIS — H44001 Unspecified purulent endophthalmitis, right eye: Secondary | ICD-10-CM | POA: Diagnosis present

## 2018-09-08 DIAGNOSIS — R948 Abnormal results of function studies of other organs and systems: Secondary | ICD-10-CM | POA: Diagnosis present

## 2018-09-08 DIAGNOSIS — D8689 Sarcoidosis of other sites: Secondary | ICD-10-CM | POA: Diagnosis present

## 2018-09-08 DIAGNOSIS — D333 Benign neoplasm of cranial nerves: Secondary | ICD-10-CM | POA: Diagnosis present

## 2018-09-08 DIAGNOSIS — G319 Degenerative disease of nervous system, unspecified: Secondary | ICD-10-CM | POA: Diagnosis present

## 2018-09-08 DIAGNOSIS — M479 Spondylosis, unspecified: Secondary | ICD-10-CM | POA: Diagnosis not present

## 2018-09-08 DIAGNOSIS — H8149 Vertigo of central origin, unspecified ear: Secondary | ICD-10-CM | POA: Diagnosis present

## 2018-09-08 DIAGNOSIS — E039 Hypothyroidism, unspecified: Secondary | ICD-10-CM | POA: Diagnosis present

## 2018-09-08 DIAGNOSIS — H8110 Benign paroxysmal vertigo, unspecified ear: Secondary | ICD-10-CM | POA: Diagnosis present

## 2018-09-08 DIAGNOSIS — H538 Other visual disturbances: Secondary | ICD-10-CM | POA: Diagnosis present

## 2018-09-08 DIAGNOSIS — G959 Disease of spinal cord, unspecified: Secondary | ICD-10-CM | POA: Diagnosis present

## 2018-09-08 DIAGNOSIS — R112 Nausea with vomiting, unspecified: Secondary | ICD-10-CM | POA: Diagnosis not present

## 2018-09-08 DIAGNOSIS — H9313 Tinnitus, bilateral: Secondary | ICD-10-CM | POA: Diagnosis present

## 2018-09-08 DIAGNOSIS — N3020 Other chronic cystitis without hematuria: Secondary | ICD-10-CM | POA: Diagnosis present

## 2018-09-08 DIAGNOSIS — H47213 Primary optic atrophy, bilateral: Secondary | ICD-10-CM | POA: Diagnosis present

## 2018-09-08 DIAGNOSIS — R42 Dizziness and giddiness: Secondary | ICD-10-CM | POA: Diagnosis not present

## 2018-09-08 DIAGNOSIS — R531 Weakness: Secondary | ICD-10-CM | POA: Diagnosis present

## 2018-09-08 DIAGNOSIS — H5500 Unspecified nystagmus: Secondary | ICD-10-CM | POA: Diagnosis present

## 2018-10-07 DIAGNOSIS — R42 Dizziness and giddiness: Secondary | ICD-10-CM | POA: Diagnosis not present

## 2018-10-07 DIAGNOSIS — H903 Sensorineural hearing loss, bilateral: Secondary | ICD-10-CM | POA: Diagnosis not present

## 2018-10-14 DIAGNOSIS — R42 Dizziness and giddiness: Secondary | ICD-10-CM | POA: Diagnosis not present

## 2018-10-20 ENCOUNTER — Other Ambulatory Visit: Payer: Self-pay | Admitting: Nurse Practitioner

## 2018-10-20 DIAGNOSIS — R8290 Unspecified abnormal findings in urine: Secondary | ICD-10-CM

## 2018-10-20 DIAGNOSIS — D333 Benign neoplasm of cranial nerves: Secondary | ICD-10-CM | POA: Diagnosis not present

## 2018-10-20 DIAGNOSIS — R42 Dizziness and giddiness: Secondary | ICD-10-CM | POA: Diagnosis not present

## 2018-10-20 DIAGNOSIS — H8111 Benign paroxysmal vertigo, right ear: Secondary | ICD-10-CM | POA: Diagnosis not present

## 2018-10-22 DIAGNOSIS — Z23 Encounter for immunization: Secondary | ICD-10-CM | POA: Diagnosis not present

## 2018-10-27 DIAGNOSIS — R9402 Abnormal brain scan: Secondary | ICD-10-CM | POA: Diagnosis not present

## 2018-10-27 DIAGNOSIS — F09 Unspecified mental disorder due to known physiological condition: Secondary | ICD-10-CM | POA: Diagnosis not present

## 2018-10-27 DIAGNOSIS — D869 Sarcoidosis, unspecified: Secondary | ICD-10-CM | POA: Diagnosis not present

## 2018-10-27 DIAGNOSIS — H47213 Primary optic atrophy, bilateral: Secondary | ICD-10-CM | POA: Diagnosis not present

## 2018-10-27 DIAGNOSIS — G119 Hereditary ataxia, unspecified: Secondary | ICD-10-CM | POA: Diagnosis not present

## 2018-10-30 DIAGNOSIS — G319 Degenerative disease of nervous system, unspecified: Secondary | ICD-10-CM | POA: Diagnosis not present

## 2018-10-30 DIAGNOSIS — R42 Dizziness and giddiness: Secondary | ICD-10-CM | POA: Diagnosis not present

## 2018-11-06 DIAGNOSIS — M76892 Other specified enthesopathies of left lower limb, excluding foot: Secondary | ICD-10-CM | POA: Diagnosis not present

## 2018-11-19 DIAGNOSIS — G959 Disease of spinal cord, unspecified: Secondary | ICD-10-CM | POA: Diagnosis not present

## 2018-11-19 DIAGNOSIS — R2681 Unsteadiness on feet: Secondary | ICD-10-CM | POA: Diagnosis not present

## 2018-11-19 DIAGNOSIS — D8689 Sarcoidosis of other sites: Secondary | ICD-10-CM | POA: Diagnosis not present

## 2018-12-01 DIAGNOSIS — G119 Hereditary ataxia, unspecified: Secondary | ICD-10-CM | POA: Diagnosis not present

## 2018-12-01 DIAGNOSIS — H47293 Other optic atrophy, bilateral: Secondary | ICD-10-CM | POA: Diagnosis not present

## 2018-12-01 DIAGNOSIS — R9402 Abnormal brain scan: Secondary | ICD-10-CM | POA: Diagnosis not present

## 2018-12-02 DIAGNOSIS — H47213 Primary optic atrophy, bilateral: Secondary | ICD-10-CM | POA: Diagnosis not present

## 2018-12-04 DIAGNOSIS — R2681 Unsteadiness on feet: Secondary | ICD-10-CM | POA: Diagnosis not present

## 2018-12-04 DIAGNOSIS — G959 Disease of spinal cord, unspecified: Secondary | ICD-10-CM | POA: Diagnosis not present

## 2018-12-04 DIAGNOSIS — D8689 Sarcoidosis of other sites: Secondary | ICD-10-CM | POA: Diagnosis not present

## 2018-12-16 DIAGNOSIS — G959 Disease of spinal cord, unspecified: Secondary | ICD-10-CM | POA: Diagnosis not present

## 2018-12-16 DIAGNOSIS — R2681 Unsteadiness on feet: Secondary | ICD-10-CM | POA: Diagnosis not present

## 2018-12-16 DIAGNOSIS — D8689 Sarcoidosis of other sites: Secondary | ICD-10-CM | POA: Diagnosis not present

## 2018-12-19 DIAGNOSIS — D8689 Sarcoidosis of other sites: Secondary | ICD-10-CM | POA: Diagnosis not present

## 2018-12-19 DIAGNOSIS — R2681 Unsteadiness on feet: Secondary | ICD-10-CM | POA: Diagnosis not present

## 2018-12-19 DIAGNOSIS — G959 Disease of spinal cord, unspecified: Secondary | ICD-10-CM | POA: Diagnosis not present

## 2018-12-23 DIAGNOSIS — G959 Disease of spinal cord, unspecified: Secondary | ICD-10-CM | POA: Diagnosis not present

## 2018-12-23 DIAGNOSIS — R2681 Unsteadiness on feet: Secondary | ICD-10-CM | POA: Diagnosis not present

## 2018-12-23 DIAGNOSIS — D8689 Sarcoidosis of other sites: Secondary | ICD-10-CM | POA: Diagnosis not present

## 2018-12-26 DIAGNOSIS — R2681 Unsteadiness on feet: Secondary | ICD-10-CM | POA: Diagnosis not present

## 2018-12-26 DIAGNOSIS — G959 Disease of spinal cord, unspecified: Secondary | ICD-10-CM | POA: Diagnosis not present

## 2018-12-26 DIAGNOSIS — D8689 Sarcoidosis of other sites: Secondary | ICD-10-CM | POA: Diagnosis not present

## 2018-12-30 DIAGNOSIS — G959 Disease of spinal cord, unspecified: Secondary | ICD-10-CM | POA: Diagnosis not present

## 2018-12-30 DIAGNOSIS — D8689 Sarcoidosis of other sites: Secondary | ICD-10-CM | POA: Diagnosis not present

## 2018-12-30 DIAGNOSIS — R2681 Unsteadiness on feet: Secondary | ICD-10-CM | POA: Diagnosis not present

## 2019-01-02 DIAGNOSIS — R2681 Unsteadiness on feet: Secondary | ICD-10-CM | POA: Diagnosis not present

## 2019-01-02 DIAGNOSIS — D8689 Sarcoidosis of other sites: Secondary | ICD-10-CM | POA: Diagnosis not present

## 2019-01-02 DIAGNOSIS — G959 Disease of spinal cord, unspecified: Secondary | ICD-10-CM | POA: Diagnosis not present

## 2019-01-06 DIAGNOSIS — R2681 Unsteadiness on feet: Secondary | ICD-10-CM | POA: Diagnosis not present

## 2019-01-06 DIAGNOSIS — G959 Disease of spinal cord, unspecified: Secondary | ICD-10-CM | POA: Diagnosis not present

## 2019-01-06 DIAGNOSIS — D8689 Sarcoidosis of other sites: Secondary | ICD-10-CM | POA: Diagnosis not present

## 2019-01-09 DIAGNOSIS — R2681 Unsteadiness on feet: Secondary | ICD-10-CM | POA: Diagnosis not present

## 2019-01-09 DIAGNOSIS — D8689 Sarcoidosis of other sites: Secondary | ICD-10-CM | POA: Diagnosis not present

## 2019-01-09 DIAGNOSIS — G959 Disease of spinal cord, unspecified: Secondary | ICD-10-CM | POA: Diagnosis not present

## 2019-01-13 DIAGNOSIS — R2681 Unsteadiness on feet: Secondary | ICD-10-CM | POA: Diagnosis not present

## 2019-01-13 DIAGNOSIS — D8689 Sarcoidosis of other sites: Secondary | ICD-10-CM | POA: Diagnosis not present

## 2019-01-13 DIAGNOSIS — G959 Disease of spinal cord, unspecified: Secondary | ICD-10-CM | POA: Diagnosis not present

## 2019-01-15 DIAGNOSIS — E039 Hypothyroidism, unspecified: Secondary | ICD-10-CM | POA: Diagnosis not present

## 2019-01-15 DIAGNOSIS — B354 Tinea corporis: Secondary | ICD-10-CM | POA: Diagnosis not present

## 2019-01-16 DIAGNOSIS — D8689 Sarcoidosis of other sites: Secondary | ICD-10-CM | POA: Diagnosis not present

## 2019-01-16 DIAGNOSIS — R2681 Unsteadiness on feet: Secondary | ICD-10-CM | POA: Diagnosis not present

## 2019-01-16 DIAGNOSIS — G959 Disease of spinal cord, unspecified: Secondary | ICD-10-CM | POA: Diagnosis not present

## 2019-01-20 DIAGNOSIS — R2681 Unsteadiness on feet: Secondary | ICD-10-CM | POA: Diagnosis not present

## 2019-01-20 DIAGNOSIS — D8689 Sarcoidosis of other sites: Secondary | ICD-10-CM | POA: Diagnosis not present

## 2019-01-20 DIAGNOSIS — G959 Disease of spinal cord, unspecified: Secondary | ICD-10-CM | POA: Diagnosis not present

## 2019-01-21 DIAGNOSIS — E039 Hypothyroidism, unspecified: Secondary | ICD-10-CM | POA: Diagnosis not present

## 2019-01-23 DIAGNOSIS — D8689 Sarcoidosis of other sites: Secondary | ICD-10-CM | POA: Diagnosis not present

## 2019-01-23 DIAGNOSIS — R2681 Unsteadiness on feet: Secondary | ICD-10-CM | POA: Diagnosis not present

## 2019-01-23 DIAGNOSIS — G959 Disease of spinal cord, unspecified: Secondary | ICD-10-CM | POA: Diagnosis not present

## 2019-01-27 DIAGNOSIS — D8689 Sarcoidosis of other sites: Secondary | ICD-10-CM | POA: Diagnosis not present

## 2019-01-27 DIAGNOSIS — R2681 Unsteadiness on feet: Secondary | ICD-10-CM | POA: Diagnosis not present

## 2019-01-27 DIAGNOSIS — G959 Disease of spinal cord, unspecified: Secondary | ICD-10-CM | POA: Diagnosis not present

## 2019-01-30 DIAGNOSIS — G959 Disease of spinal cord, unspecified: Secondary | ICD-10-CM | POA: Diagnosis not present

## 2019-01-30 DIAGNOSIS — D8689 Sarcoidosis of other sites: Secondary | ICD-10-CM | POA: Diagnosis not present

## 2019-01-30 DIAGNOSIS — R2681 Unsteadiness on feet: Secondary | ICD-10-CM | POA: Diagnosis not present

## 2019-02-03 DIAGNOSIS — D8689 Sarcoidosis of other sites: Secondary | ICD-10-CM | POA: Diagnosis not present

## 2019-02-03 DIAGNOSIS — R2681 Unsteadiness on feet: Secondary | ICD-10-CM | POA: Diagnosis not present

## 2019-02-03 DIAGNOSIS — G959 Disease of spinal cord, unspecified: Secondary | ICD-10-CM | POA: Diagnosis not present

## 2019-02-05 DIAGNOSIS — R42 Dizziness and giddiness: Secondary | ICD-10-CM | POA: Diagnosis not present

## 2019-02-05 DIAGNOSIS — H47213 Primary optic atrophy, bilateral: Secondary | ICD-10-CM | POA: Diagnosis not present

## 2019-02-05 DIAGNOSIS — D869 Sarcoidosis, unspecified: Secondary | ICD-10-CM | POA: Diagnosis not present

## 2019-02-05 DIAGNOSIS — R9402 Abnormal brain scan: Secondary | ICD-10-CM | POA: Diagnosis not present

## 2019-02-05 DIAGNOSIS — H903 Sensorineural hearing loss, bilateral: Secondary | ICD-10-CM | POA: Diagnosis not present

## 2019-02-06 DIAGNOSIS — D8689 Sarcoidosis of other sites: Secondary | ICD-10-CM | POA: Diagnosis not present

## 2019-02-06 DIAGNOSIS — R2681 Unsteadiness on feet: Secondary | ICD-10-CM | POA: Diagnosis not present

## 2019-02-06 DIAGNOSIS — G959 Disease of spinal cord, unspecified: Secondary | ICD-10-CM | POA: Diagnosis not present

## 2019-02-10 DIAGNOSIS — R2681 Unsteadiness on feet: Secondary | ICD-10-CM | POA: Diagnosis not present

## 2019-02-10 DIAGNOSIS — D8689 Sarcoidosis of other sites: Secondary | ICD-10-CM | POA: Diagnosis not present

## 2019-02-10 DIAGNOSIS — G959 Disease of spinal cord, unspecified: Secondary | ICD-10-CM | POA: Diagnosis not present

## 2019-02-13 DIAGNOSIS — G959 Disease of spinal cord, unspecified: Secondary | ICD-10-CM | POA: Diagnosis not present

## 2019-02-13 DIAGNOSIS — D8689 Sarcoidosis of other sites: Secondary | ICD-10-CM | POA: Diagnosis not present

## 2019-02-13 DIAGNOSIS — R2681 Unsteadiness on feet: Secondary | ICD-10-CM | POA: Diagnosis not present

## 2019-02-24 DIAGNOSIS — D649 Anemia, unspecified: Secondary | ICD-10-CM | POA: Diagnosis not present

## 2019-02-24 DIAGNOSIS — Z681 Body mass index (BMI) 19 or less, adult: Secondary | ICD-10-CM | POA: Diagnosis not present

## 2019-02-24 DIAGNOSIS — E7800 Pure hypercholesterolemia, unspecified: Secondary | ICD-10-CM | POA: Diagnosis not present

## 2021-03-17 DEATH — deceased
